# Patient Record
Sex: Female | Born: 1960 | Race: White | Hispanic: No | Smoking: Light tobacco smoker
Health system: Southern US, Community
[De-identification: ages and names within clinical notes are randomized; demographics above are authoritative.]

## PROBLEM LIST (undated history)

## (undated) DIAGNOSIS — Z9989 Dependence on other enabling machines and devices: Secondary | ICD-10-CM

## (undated) DIAGNOSIS — R112 Nausea with vomiting, unspecified: Secondary | ICD-10-CM

## (undated) DIAGNOSIS — Z789 Other specified health status: Secondary | ICD-10-CM

## (undated) DIAGNOSIS — Z9889 Other specified postprocedural states: Secondary | ICD-10-CM

## (undated) DIAGNOSIS — K219 Gastro-esophageal reflux disease without esophagitis: Secondary | ICD-10-CM

## (undated) DIAGNOSIS — N951 Menopausal and female climacteric states: Secondary | ICD-10-CM

## (undated) DIAGNOSIS — E538 Deficiency of other specified B group vitamins: Secondary | ICD-10-CM

## (undated) DIAGNOSIS — K439 Ventral hernia without obstruction or gangrene: Secondary | ICD-10-CM

## (undated) DIAGNOSIS — G43909 Migraine, unspecified, not intractable, without status migrainosus: Secondary | ICD-10-CM

## (undated) DIAGNOSIS — N816 Rectocele: Secondary | ICD-10-CM

## (undated) DIAGNOSIS — K8020 Calculus of gallbladder without cholecystitis without obstruction: Secondary | ICD-10-CM

## (undated) DIAGNOSIS — G4733 Obstructive sleep apnea (adult) (pediatric): Secondary | ICD-10-CM

## (undated) HISTORY — PX: TUBAL LIGATION: SHX77

## (undated) HISTORY — PX: FOOT SURGERY: SHX648

## (undated) HISTORY — PX: FACIAL COSMETIC SURGERY: SHX629

## (undated) HISTORY — PX: NECK EXPLORATION: SHX2077

## (undated) HISTORY — PX: GASTRIC BYPASS: SHX52

---

## 1997-05-10 ENCOUNTER — Ambulatory Visit (HOSPITAL_COMMUNITY): Admission: RE | Admit: 1997-05-10 | Discharge: 1997-05-10 | Payer: Self-pay | Admitting: Obstetrics and Gynecology

## 1999-02-06 ENCOUNTER — Other Ambulatory Visit: Admission: RE | Admit: 1999-02-06 | Discharge: 1999-02-06 | Payer: Self-pay | Admitting: Obstetrics and Gynecology

## 2000-10-18 ENCOUNTER — Other Ambulatory Visit: Admission: RE | Admit: 2000-10-18 | Discharge: 2000-10-18 | Payer: Self-pay | Admitting: Obstetrics and Gynecology

## 2010-08-17 ENCOUNTER — Inpatient Hospital Stay (INDEPENDENT_AMBULATORY_CARE_PROVIDER_SITE_OTHER)
Admission: RE | Admit: 2010-08-17 | Discharge: 2010-08-17 | Disposition: A | Discharge status: Home / Self Care | Payer: 59 | Source: Ambulatory Visit | Attending: Family Medicine | Admitting: Family Medicine

## 2010-08-17 ENCOUNTER — Encounter: Payer: Self-pay | Admitting: Family Medicine

## 2010-08-17 ENCOUNTER — Ambulatory Visit
Admission: RE | Admit: 2010-08-17 | Discharge: 2010-08-17 | Disposition: A | Discharge status: Home / Self Care | Payer: 59 | Source: Ambulatory Visit | Attending: Family Medicine | Admitting: Family Medicine

## 2010-08-17 ENCOUNTER — Other Ambulatory Visit: Payer: Self-pay | Admitting: Family Medicine

## 2010-08-17 DIAGNOSIS — R079 Chest pain, unspecified: Secondary | ICD-10-CM

## 2010-08-17 DIAGNOSIS — J069 Acute upper respiratory infection, unspecified: Secondary | ICD-10-CM

## 2010-08-17 DIAGNOSIS — M940 Chondrocostal junction syndrome [Tietze]: Secondary | ICD-10-CM

## 2010-08-19 ENCOUNTER — Telehealth (INDEPENDENT_AMBULATORY_CARE_PROVIDER_SITE_OTHER): Payer: Self-pay | Admitting: Emergency Medicine

## 2011-01-25 NOTE — Progress Notes (Signed)
Summary: severe pain under right shoulder rm 4   Vital Signs:  Patient Profile:   50 Years Old Female CC:      RT shoulder pain x 2 AM this morning  LMP:     10/23/2009 Height:     62 inches Weight:      225 pounds O2 Sat:      96 % O2 treatment:    Room Air Temp:     99.6 degrees F oral Pulse rate:   82 / minute Resp:     20 per minute BP sitting:   130 / 85  (left arm) Cuff size:   large  Pt. in pain?   yes    Location:   shoulder    Intensity:   8    Type:       sharp  Vitals Entered By: Clemens Catholic LPN (August 17, 2010 8:21 AM)  Menstrual History: LMP (date): 10/23/2009 LMP - Character: no hx of hysterectomy                   Updated Prior Medication List: ESTRADIOL 2 MG TABS (ESTRADIOL)  MEDROXYPROGESTERONE ACETATE 2.5 MG TABS (MEDROXYPROGESTERONE ACETATE)   Current Allergies: No known allergies History of Present Illness Chief Complaint: RT shoulder pain x 2 AM this morning  History of Present Illness:  Subjective: Patient complains of onset of sharp pain underneath her right shoulder blade that awakened her about 2AM today.  The pain now radiates to her right lateral chest, and is worse with cough and deep inspiration.  She has had a cold for about a week with productive cough that had been improving.  She is not a smoker. Initial sore throat resolved No wheezing + nasal congestion, improved + post-nasal drainage No sinus pain/pressure No itchy/red eyes No earache No hemoptysis No SOB No fever/chills No nausea No vomiting No abdominal pain No diarrhea No skin rashes No fatigue No myalgias No headache    REVIEW OF SYSTEMS Constitutional Symptoms      Denies fever, chills, night sweats, weight loss, weight gain, and fatigue.  Eyes       Complains of glasses.      Denies change in vision, eye pain, eye discharge, contact lenses, and eye surgery. Ear/Nose/Throat/Mouth       Denies hearing loss/aids, change in hearing, ear pain, ear  discharge, dizziness, frequent runny nose, frequent nose bleeds, sinus problems, sore throat, hoarseness, and tooth pain or bleeding.  Respiratory       Complains of dry cough.      Denies productive cough, wheezing, shortness of breath, asthma, bronchitis, and emphysema/COPD.  Cardiovascular       Denies murmurs, chest pain, and tires easily with exhertion.    Gastrointestinal       Denies stomach pain, nausea/vomiting, diarrhea, constipation, blood in bowel movements, and indigestion. Genitourniary       Denies painful urination, kidney stones, and loss of urinary control. Neurological       Denies paralysis, seizures, and fainting/blackouts. Musculoskeletal       Denies muscle pain, joint pain, joint stiffness, decreased range of motion, redness, swelling, muscle weakness, and gout.  Skin       Denies bruising, unusual mles/lumps or sores, and hair/skin or nail changes.  Psych       Denies mood changes, temper/anger issues, anxiety/stress, speech problems, depression, and sleep problems. Other Comments: pt c/o severe pain under her RT shoulder blade x 2 AM this morning. She  states that it is now moving around to her RUQ.  she states that she did have diarrhea for 1 day, 2 wks ago. she has also had a cold x 1wk. no OTC meds.   Past History:  Past Medical History: Unremarkable  Past Surgical History: Tubal ligation plantar fascitis   Family History: sister colon CA  Social History: Never Smoked Alcohol use-no Drug use-no Smoking Status:  never Drug Use:  no   Objective:  Appearance:  Patient is obese but otherwise appears healthy, stated age, and in no acute distress  Eyes:  Pupils are equal, round, and reactive to light and accomodation.  Extraocular movement is intact.  Conjunctivae are not inflamed.  Pharynx:  Normal  Neck:  Supple.  No adenopathy is present.   Lungs:  Clear to auscultation.  Breath sounds are equal.  Chest:  Tenderness right chest from base of  scapula to beneath right breast.  Also tender over sternum Heart:  Regular rate and rhythm without murmurs, rubs, or gallops.  Abdomen:  Nontender without masses or hepatosplenomegaly.  Bowel sounds are present.  No CVA or flank tenderness.  Skin:  No rash Extremities:  No edema.  X-ray right ribs and chest:  IMPRESSION: No evidence of right-sided rib fracture, pleural effusion or pneumothorax. Assessment New Problems: UPPER RESPIRATORY INFECTION, ACUTE (ICD-465.9) COSTOCHONDRITIS, ACUTE (ICD-733.6) RIB PAIN, RIGHT SIDED (ICD-786.50)  RESOLVING URI WITH COSTOCHONDRITIS  Plan New Medications/Changes: LORTAB 5 5-500 MG TABS (HYDROCODONE-ACETAMINOPHEN) One or two tabs by mouth hs as needed pain  #12 (twelve) x 0, 08/17/2010, Donna Christen MD  New Orders: T-DG Ribs Unilateral w/Chest*R* [71101] Rib Belt [L0210] Pulse Oximetry (single measurment) [94760] New Patient Level III [16109] Planning Comments:   Rib belt applied.  Begin Ibuprofen for 5 to 7 days (has Rx at home).  Analgesic for bedtime. Return if develops fever or worsening symptoms  Given a [employer]ter patient information and instruction sheet on topic costochondritis   The patient and/or caregiver has been counseled thoroughly with regard to medications prescribed including dosage, schedule, interactions, rationale for use, and possible side effects and they verbalize understanding.  Diagnoses and expected course of recovery discussed and will return if not improved as expected or if the condition worsens. Patient and/or caregiver verbalized understanding.  Prescriptions: LORTAB 5 5-500 MG TABS (HYDROCODONE-ACETAMINOPHEN) One or two tabs by mouth hs as needed pain  #12 (twelve) x 0   Entered and Authorized by:   Donna Christen MD   Signed by:   Donna Christen MD on 08/17/2010   Method used:   Print then Give to Patient   RxID:   [id number]   Orders Added: 1)  T-DG Ribs Unilateral w/Chest*R* [71101] 2)  Rib Belt  [L0210] 3)  Pulse Oximetry (single measurment) [94760] 4)  New Patient Level III [95621]

## 2011-01-25 NOTE — Telephone Encounter (Signed)
  Phone Note Outgoing Call Call back at [facility] Phone [phone]   Call placed by: Emilio Math,  August 19, 2010 2:41 PM Call placed to: Patient Summary of Call: Number disconnected

## 2011-01-25 NOTE — Letter (Signed)
Summary: Out of Work  [facility]  [address]   [place], [place] [address]   Phone: [phone]  Fax: [phone]    August 17, 2010   Employee:  ARNOLD DEPINTO    To Whom It May Concern:   For Medical reasons, please excuse the above named employee from work today and tomorrow.   If you need additional information, please feel free to contact our office.         Sincerely,    Donna Christen MD

## 2011-12-27 DIAGNOSIS — N951 Menopausal and female climacteric states: Secondary | ICD-10-CM | POA: Insufficient documentation

## 2013-07-05 ENCOUNTER — Other Ambulatory Visit (INDEPENDENT_AMBULATORY_CARE_PROVIDER_SITE_OTHER): Payer: Self-pay | Admitting: Otolaryngology

## 2013-07-05 DIAGNOSIS — H918X9 Other specified hearing loss, unspecified ear: Secondary | ICD-10-CM

## 2013-07-05 DIAGNOSIS — R42 Dizziness and giddiness: Secondary | ICD-10-CM

## 2013-07-19 ENCOUNTER — Ambulatory Visit
Admission: RE | Admit: 2013-07-19 | Discharge: 2013-07-19 | Disposition: A | Discharge status: Home / Self Care | Payer: 59 | Source: Ambulatory Visit | Attending: Otolaryngology | Admitting: Otolaryngology

## 2013-07-19 DIAGNOSIS — H918X9 Other specified hearing loss, unspecified ear: Secondary | ICD-10-CM

## 2013-07-19 DIAGNOSIS — R42 Dizziness and giddiness: Secondary | ICD-10-CM

## 2013-07-19 MED ORDER — GADOBENATE DIMEGLUMINE 529 MG/ML IV SOLN
20.0000 mL | Freq: Once | INTRAVENOUS | Status: AC | PRN
  Administered 2013-07-19: 20 mL via INTRAVENOUS

## 2013-09-20 ENCOUNTER — Other Ambulatory Visit: Payer: Self-pay | Admitting: Orthopedic Surgery

## 2013-09-20 ENCOUNTER — Encounter (HOSPITAL_COMMUNITY): Payer: Self-pay | Admitting: Pharmacy Technician

## 2013-09-27 ENCOUNTER — Encounter (HOSPITAL_COMMUNITY)
Admission: RE | Admit: 2013-09-27 | Discharge: 2013-09-27 | Disposition: A | Discharge status: Home / Self Care | Payer: 59 | Source: Ambulatory Visit | Attending: Orthopedic Surgery | Admitting: Orthopedic Surgery

## 2013-09-27 ENCOUNTER — Ambulatory Visit (HOSPITAL_COMMUNITY)
Admission: RE | Admit: 2013-09-27 | Discharge: 2013-09-27 | Disposition: A | Discharge status: Home / Self Care | Payer: 59 | Source: Ambulatory Visit | Attending: Orthopedic Surgery | Admitting: Orthopedic Surgery

## 2013-09-27 ENCOUNTER — Encounter (HOSPITAL_COMMUNITY): Payer: Self-pay

## 2013-09-27 DIAGNOSIS — Z01818 Encounter for other preprocedural examination: Secondary | ICD-10-CM | POA: Diagnosis present

## 2013-09-27 HISTORY — DX: Other specified health status: Z78.9

## 2013-09-27 LAB — CBC WITH DIFFERENTIAL/PLATELET
BASOS ABS: 0.1 10*3/uL (ref 0.0–0.1)
Basophils Relative: 1 % (ref 0–1)
EOS PCT: 2 % (ref 0–5)
Eosinophils Absolute: 0.2 10*3/uL (ref 0.0–0.7)
HCT: 45.7 % (ref 36.0–46.0)
Hemoglobin: 15.2 g/dL — ABNORMAL HIGH (ref 12.0–15.0)
Lymphocytes Relative: 19 % (ref 12–46)
Lymphs Abs: 2 10*3/uL (ref 0.7–4.0)
MCH: 30.2 pg (ref 26.0–34.0)
MCHC: 33.3 g/dL (ref 30.0–36.0)
MCV: 90.9 fL (ref 78.0–100.0)
Monocytes Absolute: 1 10*3/uL (ref 0.1–1.0)
Monocytes Relative: 9 % (ref 3–12)
Neutro Abs: 7.2 10*3/uL (ref 1.7–7.7)
Neutrophils Relative %: 69 % (ref 43–77)
PLATELETS: 313 10*3/uL (ref 150–400)
RBC: 5.03 MIL/uL (ref 3.87–5.11)
RDW: 13.7 % (ref 11.5–15.5)
WBC: 10.4 10*3/uL (ref 4.0–10.5)

## 2013-09-27 LAB — COMPREHENSIVE METABOLIC PANEL
ALT: 14 U/L (ref 0–35)
AST: 15 U/L (ref 0–37)
Albumin: 3.5 g/dL (ref 3.5–5.2)
Alkaline Phosphatase: 54 U/L (ref 39–117)
Anion gap: 13 (ref 5–15)
BUN: 17 mg/dL (ref 6–23)
CALCIUM: 8.9 mg/dL (ref 8.4–10.5)
CO2: 23 mEq/L (ref 19–32)
Chloride: 105 mEq/L (ref 96–112)
Creatinine, Ser: 0.71 mg/dL (ref 0.50–1.10)
GFR calc non Af Amer: >90 mL/min (ref >90)
GLUCOSE: 99 mg/dL (ref 70–99)
Potassium: 4.1 mEq/L (ref 3.7–5.3)
SODIUM: 141 meq/L (ref 137–147)
Total Bilirubin: 0.3 mg/dL (ref 0.3–1.2)
Total Protein: 6.8 g/dL (ref 6.0–8.3)

## 2013-09-27 LAB — ABO/RH: ABO/RH(D): A POS

## 2013-09-27 LAB — URINALYSIS, ROUTINE W REFLEX MICROSCOPIC
BILIRUBIN URINE: NEGATIVE
GLUCOSE, UA: NEGATIVE mg/dL
HGB URINE DIPSTICK: NEGATIVE
KETONES UR: NEGATIVE mg/dL
LEUKOCYTES UA: NEGATIVE
Nitrite: NEGATIVE
PROTEIN: NEGATIVE mg/dL
Specific Gravity, Urine: 1.028 (ref 1.005–1.030)
Urobilinogen, UA: 0.2 mg/dL (ref 0.0–1.0)
pH: 6 (ref 5.0–8.0)

## 2013-09-27 LAB — TYPE AND SCREEN
ABO/RH(D): A POS
Antibody Screen: NEGATIVE

## 2013-09-27 LAB — PROTIME-INR
INR: 0.91 (ref 0.00–1.49)
Prothrombin Time: 12.3 seconds (ref 11.6–15.2)

## 2013-09-27 LAB — SURGICAL PCR SCREEN
MRSA, PCR: NEGATIVE
STAPHYLOCOCCUS AUREUS: NEGATIVE

## 2013-09-27 LAB — APTT: aPTT: 31 seconds (ref 24–37)

## 2013-09-27 MED ORDER — POVIDONE-IODINE 7.5 % EX SOLN: Freq: Once | CUTANEOUS | Status: DC

## 2013-09-27 NOTE — Pre-Procedure Instructions (Signed)
[name]  09/27/2013   Your procedure is scheduled on:  10/04/13  Report to [facility] Admitting at 730 AM.  Call this number if you have problems the morning of surgery: [phone]   Remember:   Do not eat food or drink liquids after midnight.   Take these medicines the morning of surgery with A SIP OF WATER: effexor   Do not wear jewelry, make-up or nail polish.  Do not wear lotions, powders, or perfumes. You may wear deodorant.  Do not shave 48 hours prior to surgery. Men may shave face and neck.  Do not bring valuables to the hospital.  [facility] is not responsible                  for any belongings or valuables.               Contacts, dentures or bridgework may not be worn into surgery.  Leave suitcase in the car. After surgery it may be brought to your room.  For patients admitted to the hospital, discharge time is determined by your                treatment team.               Patients discharged the day of surgery will not be allowed to drive  home.  Name and phone number of your driver: family  Special Instructions: Shower using CHG 2 nights before surgery and the night before surgery.  If you shower the day of surgery use CHG.  Use special wash - you have one bottle of CHG for all showers.  You should use approximately 1/3 of the bottle for each shower.   Please read over the following fact sheets that you were given: Pain Booklet, Coughing and Deep Breathing, Blood Transfusion Information, MRSA Information and Surgical Site Infection Prevention

## 2013-10-03 MED ORDER — CEFAZOLIN SODIUM-DEXTROSE 2-3 GM-% IV SOLR
2.0000 g | INTRAVENOUS | Status: AC
  Administered 2013-10-04: 2 g via INTRAVENOUS
  Filled 2013-10-03: qty 50

## 2013-10-04 ENCOUNTER — Encounter (HOSPITAL_COMMUNITY): Payer: 59 | Admitting: Certified Registered Nurse Anesthetist

## 2013-10-04 ENCOUNTER — Ambulatory Visit (HOSPITAL_COMMUNITY): Payer: 59 | Admitting: Certified Registered Nurse Anesthetist

## 2013-10-04 ENCOUNTER — Observation Stay (HOSPITAL_COMMUNITY)
Admission: RE | Admit: 2013-10-04 | Discharge: 2013-10-05 | Disposition: A | Discharge status: Home / Self Care | Payer: 59 | Source: Ambulatory Visit | Attending: Orthopedic Surgery | Admitting: Orthopedic Surgery

## 2013-10-04 ENCOUNTER — Encounter (HOSPITAL_COMMUNITY)
Admission: RE | Disposition: A | Discharge status: Home / Self Care | Payer: Self-pay | Source: Ambulatory Visit | Attending: Orthopedic Surgery

## 2013-10-04 ENCOUNTER — Encounter (HOSPITAL_COMMUNITY): Payer: Self-pay | Admitting: *Deleted

## 2013-10-04 ENCOUNTER — Ambulatory Visit (HOSPITAL_COMMUNITY): Payer: 59

## 2013-10-04 DIAGNOSIS — Z87891 Personal history of nicotine dependence: Secondary | ICD-10-CM | POA: Insufficient documentation

## 2013-10-04 DIAGNOSIS — M5000 Cervical disc disorder with myelopathy, unspecified cervical region: Principal | ICD-10-CM | POA: Insufficient documentation

## 2013-10-04 DIAGNOSIS — F411 Generalized anxiety disorder: Secondary | ICD-10-CM | POA: Insufficient documentation

## 2013-10-04 DIAGNOSIS — Z981 Arthrodesis status: Secondary | ICD-10-CM | POA: Diagnosis present

## 2013-10-04 HISTORY — PX: ANTERIOR CERVICAL DECOMP/DISCECTOMY FUSION: SHX1161

## 2013-10-04 SURGERY — ANTERIOR CERVICAL DECOMPRESSION/DISCECTOMY FUSION 3 LEVELS
Anesthesia: General | Site: Neck

## 2013-10-04 MED ORDER — LACTATED RINGERS IV SOLN
INTRAVENOUS | Status: DC
  Administered 2013-10-04 (×2): via INTRAVENOUS

## 2013-10-04 MED ORDER — ACETAMINOPHEN 325 MG PO TABS: 325.0000 mg | ORAL_TABLET | ORAL | Status: DC | PRN

## 2013-10-04 MED ORDER — ONDANSETRON HCL 4 MG/2ML IJ SOLN
INTRAMUSCULAR | Status: DC | PRN
  Administered 2013-10-04: 4 mg via INTRAVENOUS

## 2013-10-04 MED ORDER — 0.9 % SODIUM CHLORIDE (POUR BTL) OPTIME
TOPICAL | Status: DC | PRN
  Administered 2013-10-04: 1000 mL

## 2013-10-04 MED ORDER — FLEET ENEMA 7-19 GM/118ML RE ENEM
1.0000 | ENEMA | Freq: Once | RECTAL | Status: AC | PRN
  Filled 2013-10-04: qty 1

## 2013-10-04 MED ORDER — ZOLPIDEM TARTRATE 5 MG PO TABS: 5.0000 mg | ORAL_TABLET | Freq: Every evening | ORAL | Status: DC | PRN

## 2013-10-04 MED ORDER — CEFAZOLIN SODIUM 1-5 GM-% IV SOLN
1.0000 g | Freq: Three times a day (TID) | INTRAVENOUS | Status: AC
  Administered 2013-10-04 – 2013-10-05 (×2): 1 g via INTRAVENOUS
  Filled 2013-10-04 (×2): qty 50

## 2013-10-04 MED ORDER — NEOSTIGMINE METHYLSULFATE 10 MG/10ML IV SOLN
INTRAVENOUS | Status: DC | PRN
  Administered 2013-10-04: 4 mg via INTRAVENOUS

## 2013-10-04 MED ORDER — ACETAMINOPHEN 325 MG PO TABS: 650.0000 mg | ORAL_TABLET | ORAL | Status: DC | PRN

## 2013-10-04 MED ORDER — PROPOFOL 10 MG/ML IV BOLUS
INTRAVENOUS | Status: DC | PRN
  Administered 2013-10-04: 140 mg via INTRAVENOUS

## 2013-10-04 MED ORDER — PHENYLEPHRINE HCL 10 MG/ML IJ SOLN
INTRAMUSCULAR | Status: DC | PRN
  Administered 2013-10-04 (×3): 40 ug via INTRAVENOUS

## 2013-10-04 MED ORDER — VENLAFAXINE HCL ER 150 MG PO CP24
150.0000 mg | ORAL_CAPSULE | Freq: Every day | ORAL | Status: DC
  Administered 2013-10-04: 150 mg via ORAL
  Filled 2013-10-04 (×3): qty 1

## 2013-10-04 MED ORDER — SENNOSIDES-DOCUSATE SODIUM 8.6-50 MG PO TABS
1.0000 | ORAL_TABLET | Freq: Every evening | ORAL | Status: DC | PRN
  Filled 2013-10-04: qty 1

## 2013-10-04 MED ORDER — KETOROLAC TROMETHAMINE 30 MG/ML IJ SOLN: 15.0000 mg | Freq: Once | INTRAMUSCULAR | Status: DC | PRN

## 2013-10-04 MED ORDER — THROMBIN 20000 UNITS EX KIT
PACK | CUTANEOUS | Status: AC
  Filled 2013-10-04: qty 1

## 2013-10-04 MED ORDER — SODIUM CHLORIDE 0.9 % IJ SOLN: 3.0000 mL | INTRAMUSCULAR | Status: DC | PRN

## 2013-10-04 MED ORDER — OXYCODONE HCL 5 MG PO TABS
5.0000 mg | ORAL_TABLET | Freq: Once | ORAL | Status: AC | PRN
  Administered 2013-10-04: 5 mg via ORAL

## 2013-10-04 MED ORDER — ROCURONIUM BROMIDE 100 MG/10ML IV SOLN
INTRAVENOUS | Status: DC | PRN
  Administered 2013-10-04: 50 mg via INTRAVENOUS

## 2013-10-04 MED ORDER — MIDAZOLAM HCL 2 MG/2ML IJ SOLN
INTRAMUSCULAR | Status: AC
  Filled 2013-10-04: qty 2

## 2013-10-04 MED ORDER — MIDAZOLAM HCL 5 MG/5ML IJ SOLN
INTRAMUSCULAR | Status: DC | PRN
  Administered 2013-10-04: 2 mg via INTRAVENOUS

## 2013-10-04 MED ORDER — ONDANSETRON HCL 4 MG/2ML IJ SOLN: 4.0000 mg | INTRAMUSCULAR | Status: DC | PRN

## 2013-10-04 MED ORDER — FENTANYL CITRATE 0.05 MG/ML IJ SOLN
INTRAMUSCULAR | Status: AC
  Filled 2013-10-04: qty 5

## 2013-10-04 MED ORDER — THROMBIN 20000 UNITS EX SOLR
CUTANEOUS | Status: DC | PRN
  Administered 2013-10-04: 13:00:00 via TOPICAL

## 2013-10-04 MED ORDER — PHENOL 1.4 % MT LIQD: 1.0000 | OROMUCOSAL | Status: DC | PRN

## 2013-10-04 MED ORDER — GLYCOPYRROLATE 0.2 MG/ML IJ SOLN
INTRAMUSCULAR | Status: DC | PRN
  Administered 2013-10-04: 0.6 mg via INTRAVENOUS

## 2013-10-04 MED ORDER — MENTHOL 3 MG MT LOZG
1.0000 | LOZENGE | OROMUCOSAL | Status: DC | PRN
  Administered 2013-10-04: 3 mg via ORAL
  Filled 2013-10-04: qty 9

## 2013-10-04 MED ORDER — PROMETHAZINE HCL 25 MG/ML IJ SOLN
INTRAMUSCULAR | Status: AC
  Administered 2013-10-04: 6.25 mg
  Filled 2013-10-04: qty 1

## 2013-10-04 MED ORDER — BISACODYL 5 MG PO TBEC
5.0000 mg | DELAYED_RELEASE_TABLET | Freq: Every day | ORAL | Status: DC | PRN
  Filled 2013-10-04: qty 1

## 2013-10-04 MED ORDER — BUPIVACAINE-EPINEPHRINE (PF) 0.25% -1:200000 IJ SOLN
INTRAMUSCULAR | Status: AC
  Filled 2013-10-04: qty 30

## 2013-10-04 MED ORDER — DOCUSATE SODIUM 100 MG PO CAPS
100.0000 mg | ORAL_CAPSULE | Freq: Two times a day (BID) | ORAL | Status: DC
  Administered 2013-10-04 – 2013-10-05 (×2): 100 mg via ORAL
  Filled 2013-10-04 (×3): qty 1

## 2013-10-04 MED ORDER — OXYCODONE HCL 5 MG/5ML PO SOLN: 5.0000 mg | Freq: Once | ORAL | Status: AC | PRN

## 2013-10-04 MED ORDER — PROPOFOL 10 MG/ML IV BOLUS
INTRAVENOUS | Status: AC
  Filled 2013-10-04: qty 20

## 2013-10-04 MED ORDER — OXYCODONE-ACETAMINOPHEN 5-325 MG PO TABS
1.0000 | ORAL_TABLET | ORAL | Status: DC | PRN
  Administered 2013-10-04 – 2013-10-05 (×4): 2 via ORAL
  Filled 2013-10-04 (×4): qty 2

## 2013-10-04 MED ORDER — LIDOCAINE HCL (CARDIAC) 20 MG/ML IV SOLN
INTRAVENOUS | Status: DC | PRN
Start: 2013-10-04 — End: 2013-10-04
  Administered 2013-10-04: 80 mg via INTRAVENOUS

## 2013-10-04 MED ORDER — SODIUM CHLORIDE 0.9 % IJ SOLN
3.0000 mL | Freq: Two times a day (BID) | INTRAMUSCULAR | Status: DC
  Administered 2013-10-04: 3 mL via INTRAVENOUS

## 2013-10-04 MED ORDER — KETOROLAC TROMETHAMINE 15 MG/ML IJ SOLN
INTRAMUSCULAR | Status: AC
  Administered 2013-10-04: 30 mg
  Filled 2013-10-04: qty 2

## 2013-10-04 MED ORDER — MORPHINE SULFATE 2 MG/ML IJ SOLN
1.0000 mg | INTRAMUSCULAR | Status: DC | PRN
  Administered 2013-10-04: 2 mg via INTRAVENOUS
  Filled 2013-10-04: qty 1

## 2013-10-04 MED ORDER — THROMBIN 20000 UNITS EX SOLR
CUTANEOUS | Status: AC
  Filled 2013-10-04: qty 20000

## 2013-10-04 MED ORDER — ACETAMINOPHEN 650 MG RE SUPP: 650.0000 mg | RECTAL | Status: DC | PRN

## 2013-10-04 MED ORDER — FENTANYL CITRATE 0.05 MG/ML IJ SOLN
INTRAMUSCULAR | Status: DC | PRN
  Administered 2013-10-04: 100 ug via INTRAVENOUS
  Administered 2013-10-04: 150 ug via INTRAVENOUS
  Administered 2013-10-04: 25 ug via INTRAVENOUS
  Administered 2013-10-04 (×2): 50 ug via INTRAVENOUS

## 2013-10-04 MED ORDER — DEXAMETHASONE SODIUM PHOSPHATE 4 MG/ML IJ SOLN
INTRAMUSCULAR | Status: DC | PRN
  Administered 2013-10-04: 4 mg via INTRAVENOUS

## 2013-10-04 MED ORDER — HYDROMORPHONE HCL PF 1 MG/ML IJ SOLN
INTRAMUSCULAR | Status: AC
  Filled 2013-10-04: qty 1

## 2013-10-04 MED ORDER — HYDROMORPHONE HCL PF 1 MG/ML IJ SOLN
0.2500 mg | INTRAMUSCULAR | Status: DC | PRN
  Administered 2013-10-04: 0.5 mg via INTRAVENOUS

## 2013-10-04 MED ORDER — BUPIVACAINE-EPINEPHRINE 0.25% -1:200000 IJ SOLN
INTRAMUSCULAR | Status: DC | PRN
  Administered 2013-10-04: 2 mL

## 2013-10-04 MED ORDER — DIAZEPAM 5 MG PO TABS
5.0000 mg | ORAL_TABLET | Freq: Four times a day (QID) | ORAL | Status: DC | PRN
  Administered 2013-10-04 – 2013-10-05 (×3): 5 mg via ORAL
  Filled 2013-10-04 (×3): qty 1

## 2013-10-04 MED ORDER — OXYCODONE HCL 5 MG PO TABS
ORAL_TABLET | ORAL | Status: AC
  Filled 2013-10-04: qty 1

## 2013-10-04 MED ORDER — ACETAMINOPHEN 160 MG/5ML PO SOLN
325.0000 mg | ORAL | Status: DC | PRN
  Filled 2013-10-04: qty 20.3

## 2013-10-04 MED ORDER — ALUM & MAG HYDROXIDE-SIMETH 200-200-20 MG/5ML PO SUSP: 30.0000 mL | Freq: Four times a day (QID) | ORAL | Status: DC | PRN

## 2013-10-04 MED ORDER — LACTATED RINGERS IV SOLN
INTRAVENOUS | Status: DC | PRN
  Administered 2013-10-04 (×3): via INTRAVENOUS

## 2013-10-04 SURGICAL SUPPLY — 82 items
APL SKNCLS STERI-STRIP NONHPOA (GAUZE/BANDAGES/DRESSINGS) ×1
BENZOIN TINCTURE PRP APPL 2/3 (GAUZE/BANDAGES/DRESSINGS) ×2 IMPLANT
BIT DRILL NEURO 2X3.1 SFT TUCH (MISCELLANEOUS) ×1 IMPLANT
BIT DRILL SKYLINE 12MM (BIT) IMPLANT
BIT DRILL SRG 14X2.2XFLT CHK (BIT) IMPLANT
BIT DRL SRG 14X2.2XFLT CHK (BIT) ×1
BLADE SURG 15 STRL LF DISP TIS (BLADE) ×1 IMPLANT
BLADE SURG 15 STRL SS (BLADE) ×2
BLADE SURG ROTATE 9660 (MISCELLANEOUS) ×2 IMPLANT
BUR MATCHSTICK NEURO 3.0 LAGG (BURR) IMPLANT
CAGE 16X6X14 ENDOSKEL MED (Orthopedic Implant) IMPLANT
CARTRIDGE OIL MAESTRO DRILL (MISCELLANEOUS) ×1 IMPLANT
CLSR STERI-STRIP ANTIMIC 1/2X4 (GAUZE/BANDAGES/DRESSINGS) ×1 IMPLANT
CORDS BIPOLAR (ELECTRODE) ×2 IMPLANT
COVER SURGICAL LIGHT HANDLE (MISCELLANEOUS) ×2 IMPLANT
CRADLE DONUT ADULT HEAD (MISCELLANEOUS) ×2 IMPLANT
DIFFUSER DRILL AIR PNEUMATIC (MISCELLANEOUS) ×2 IMPLANT
DRAIN JACKSON RD 7FR 3/32 (WOUND CARE) IMPLANT
DRAPE C-ARM 42X72 X-RAY (DRAPES) ×2 IMPLANT
DRAPE POUCH INSTRU U-SHP 10X18 (DRAPES) ×2 IMPLANT
DRAPE SURG 17X23 STRL (DRAPES) ×6 IMPLANT
DRILL BIT SKYLINE 12MM (BIT) ×2
DRILL BIT SKYLINE 14MM (BIT) ×2
DRILL NEURO 2X3.1 SOFT TOUCH (MISCELLANEOUS) ×2
DURAPREP 26ML APPLICATOR (WOUND CARE) ×2 IMPLANT
ELECT COATED BLADE 2.86 ST (ELECTRODE) ×2 IMPLANT
ELECT REM PT RETURN 9FT ADLT (ELECTROSURGICAL) ×2
ELECTRODE REM PT RTRN 9FT ADLT (ELECTROSURGICAL) ×1 IMPLANT
ENDOSKELETON IMPLANT MED 6M-0 (Orthopedic Implant) ×2 IMPLANT
EVACUATOR SILICONE 100CC (DRAIN) IMPLANT
GAUZE SPONGE 4X4 12PLY STRL (GAUZE/BANDAGES/DRESSINGS) ×2 IMPLANT
GAUZE SPONGE 4X4 16PLY XRAY LF (GAUZE/BANDAGES/DRESSINGS) ×2 IMPLANT
GLOVE BIO SURGEON STRL SZ7 (GLOVE) ×2 IMPLANT
GLOVE BIO SURGEON STRL SZ8 (GLOVE) ×2 IMPLANT
GLOVE BIOGEL PI IND STRL 7.0 (GLOVE) ×2 IMPLANT
GLOVE BIOGEL PI IND STRL 8 (GLOVE) ×1 IMPLANT
GLOVE BIOGEL PI INDICATOR 7.0 (GLOVE) ×2
GLOVE BIOGEL PI INDICATOR 8 (GLOVE) ×1
GOWN STRL REUS W/ TWL LRG LVL3 (GOWN DISPOSABLE) ×1 IMPLANT
GOWN STRL REUS W/ TWL XL LVL3 (GOWN DISPOSABLE) ×1 IMPLANT
GOWN STRL REUS W/TWL LRG LVL3 (GOWN DISPOSABLE) ×2
GOWN STRL REUS W/TWL XL LVL3 (GOWN DISPOSABLE) ×2
IMPL S ENDOSKEL TC 7 ODEG (Orthopedic Implant) IMPLANT
IMPLANT S ENDOSKEL TC 7 ODEG (Orthopedic Implant) ×4 IMPLANT
IV CATH 14GX2 1/4 (CATHETERS) ×2 IMPLANT
KIT BASIN OR (CUSTOM PROCEDURE TRAY) ×2 IMPLANT
KIT ROOM TURNOVER OR (KITS) ×2 IMPLANT
MANIFOLD NEPTUNE II (INSTRUMENTS) ×2 IMPLANT
NDL SPNL 20GX3.5 QUINCKE YW (NEEDLE) ×1 IMPLANT
NEEDLE 27GAX1X1/2 (NEEDLE) ×2 IMPLANT
NEEDLE SPNL 20GX3.5 QUINCKE YW (NEEDLE) ×2 IMPLANT
NS IRRIG 1000ML POUR BTL (IV SOLUTION) ×2 IMPLANT
OIL CARTRIDGE MAESTRO DRILL (MISCELLANEOUS) ×2
PACK ORTHO CERVICAL (CUSTOM PROCEDURE TRAY) ×2 IMPLANT
PAD ARMBOARD 7.5X6 YLW CONV (MISCELLANEOUS) ×4 IMPLANT
PATTIES SURGICAL .5 X.5 (GAUZE/BANDAGES/DRESSINGS) IMPLANT
PATTIES SURGICAL .5 X1 (DISPOSABLE) IMPLANT
PIN DISTRACTION 14 (PIN) ×2 IMPLANT
PLATE SKYLINE THREE LEVEL 51MM (Plate) ×1 IMPLANT
PUTTY BONE DBX 2.5 MIS (Bone Implant) ×2 IMPLANT
SCREW VAR SELF TAP SKYLINE 14M (Screw) ×4 IMPLANT
SCREW VARIABLE SELF TAP 12MM (Screw) ×4 IMPLANT
SPONGE GAUZE 4X4 12PLY STER LF (GAUZE/BANDAGES/DRESSINGS) ×1 IMPLANT
SPONGE INTESTINAL PEANUT (DISPOSABLE) ×2 IMPLANT
SPONGE SURGIFOAM ABS GEL 100 (HEMOSTASIS) IMPLANT
STRIP CLOSURE SKIN 1/2X4 (GAUZE/BANDAGES/DRESSINGS) ×2 IMPLANT
SURGIFLO TRUKIT (HEMOSTASIS) IMPLANT
SUT BONE WAX W31G (SUTURE) ×1 IMPLANT
SUT MNCRL AB 4-0 PS2 18 (SUTURE) IMPLANT
SUT SILK 4 0 (SUTURE)
SUT SILK 4-0 18XBRD TIE 12 (SUTURE) IMPLANT
SUT VIC AB 2-0 CT2 18 VCP726D (SUTURE) ×2 IMPLANT
SYR BULB IRRIGATION 50ML (SYRINGE) ×2 IMPLANT
SYR CONTROL 10ML LL (SYRINGE) ×2 IMPLANT
TAPE CLOTH 4X10 WHT NS (GAUZE/BANDAGES/DRESSINGS) ×2 IMPLANT
TAPE CLOTH SURG 4X10 WHT LF (GAUZE/BANDAGES/DRESSINGS) ×1 IMPLANT
TAPE UMBILICAL COTTON 1/8X30 (MISCELLANEOUS) ×4 IMPLANT
TOWEL OR 17X24 6PK STRL BLUE (TOWEL DISPOSABLE) ×2 IMPLANT
TOWEL OR 17X26 10 PK STRL BLUE (TOWEL DISPOSABLE) ×2 IMPLANT
TRAY FOLEY CATH 16FRSI W/METER (SET/KITS/TRAYS/PACK) ×2 IMPLANT
WATER STERILE IRR 1000ML POUR (IV SOLUTION) ×2 IMPLANT
YANKAUER SUCT BULB TIP NO VENT (SUCTIONS) ×2 IMPLANT

## 2013-10-04 NOTE — Anesthesia Preprocedure Evaluation (Signed)
Anesthesia Evaluation  Patient identified by MRN, date of birth, ID band Patient awake    Reviewed: Allergy & Precautions, H&P , NPO status , Patient's Chart, lab work & pertinent test results  History of Anesthesia Complications Negative for: history of anesthetic complications  Airway Mallampati: II TM Distance: >3 FB Neck ROM: Full    Dental  (+) Teeth Intact   Pulmonary neg shortness of breath, neg sleep apnea, neg COPDneg recent URI, former smoker,  breath sounds clear to auscultation        Cardiovascular negative cardio ROS  Rhythm:Regular     Neuro/Psych Neck pain with right arm symptoms, no change with neck motion  Neuromuscular disease negative psych ROS   GI/Hepatic negative GI ROS, Neg liver ROS,   Endo/Other  negative endocrine ROS  Renal/GU negative Renal ROS     Musculoskeletal negative musculoskeletal ROS (+)   Abdominal   Peds  Hematology negative hematology ROS (+)   Anesthesia Other Findings   Reproductive/Obstetrics                           Anesthesia Physical Anesthesia Plan  ASA: I  Anesthesia Plan: General   Post-op Pain Management:    Induction: Intravenous  Airway Management Planned: Oral ETT  Additional Equipment: None  Intra-op Plan:   Post-operative Plan: Extubation in OR  Informed Consent: I have reviewed the patients History and Physical, chart, labs and discussed the procedure including the risks, benefits and alternatives for the proposed anesthesia with the patient or authorized representative who has indicated his/her understanding and acceptance.   Dental advisory given  Plan Discussed with: CRNA and Surgeon  Anesthesia Plan Comments:         Anesthesia Quick Evaluation

## 2013-10-04 NOTE — Transfer of Care (Signed)
Immediate Anesthesia Transfer of Care Note  Patient: Sara Wolfe  Procedure(s) Performed: Procedure(s) with comments: ANTERIOR CERVICAL DECOMPRESSION/DISCECTOMY FUSION 3 LEVELS (N/A) - Anterior cervical decompression fusion cervical 4-5, cervical 5-6, cervical 6-7 with instrumentation and allograft  Patient Location: PACU  Anesthesia Type:General  Level of Consciousness: awake, alert , patient cooperative and responds to stimulation  Airway & Oxygen Therapy: Patient Spontanous Breathing and Patient connected to nasal cannula oxygen  Post-op Assessment: Report given to PACU RN, Post -op Vital signs reviewed and stable and Patient moving all extremities X 4  Post vital signs: Reviewed and stable  Complications: No apparent anesthesia complications

## 2013-10-04 NOTE — H&P (Signed)
     PREOPERATIVE H&P  Chief Complaint: neck pain, right arm pain  HPI: Sara Wolfe is a 53 y.o. female who presents with ongoing pain in the neck and right arm  MRI reveals varying degrees of spinal cord compression and stenosis from C4-C7  Patient has failed multiple forms of conservative care and continues to have pain (see office notes for additional details regarding the patient's full course of treatment)  Past Medical History  Diagnosis Date  . Anxiety   . Medical history non-contributory    Past Surgical History  Procedure Laterality Date  . Foot surgery    . Tubal ligation     History   Social History  . Marital Status: Divorced    Spouse Name: N/A    Number of Children: N/A  . Years of Education: N/A   Social History Main Topics  . Smoking status: Former Research scientist (life sciences)  . Smokeless tobacco: Not on file  . Alcohol Use: No  . Drug Use: No  . Sexual Activity: Not on file   Other Topics Concern  . Not on file   Social History Narrative  . No narrative on file   No family history on file. No Known Allergies Prior to Admission medications   Medication Sig Start Date End Date Taking? Authorizing Provider  BIOTIN PO Take 1 capsule by mouth daily.   Yes Historical Provider, MD  ibuprofen (ADVIL,MOTRIN) 200 MG tablet Take 800 mg by mouth every 6 (six) hours as needed for fever, headache or mild pain.   Yes Historical Provider, MD  venlafaxine XR (EFFEXOR-XR) 150 MG 24 hr capsule Take 150 mg by mouth daily with breakfast.   Yes Historical Provider, MD     All other systems have been reviewed and were otherwise negative with the exception of those mentioned in the HPI and as above.  Physical Exam: There were no vitals filed for this visit.  General: Alert, no acute distress Cardiovascular: No pedal edema Respiratory: No cyanosis, no use of accessory musculature Skin: No lesions in the area of chief complaint Neurologic: Sensation intact distally Psychiatric:  Patient is competent for consent with normal mood and affect Lymphatic: No axillary or cervical lymphadenopathy  MUSCULOSKELETAL: + spurling's on right   Assessment/Plan: Radiculopathy and cervical stenosis Plan for Procedure(s): ANTERIOR CERVICAL DECOMPRESSION/DISCECTOMY FUSION 3 LEVELS   Sinclair Ship, MD 10/04/2013 7:09 AM

## 2013-10-04 NOTE — Progress Notes (Signed)
Orthopedic Tech Progress Note Patient Details:  Sara Wolfe 1960/08/12 [id number]  Ortho Devices Type of Ortho Device: [place] cervical collar Ortho Device/Splint Location: at bedside Ortho Device/Splint Interventions: Criss Alvine 10/04/2013, 5:40 PM

## 2013-10-04 NOTE — Plan of Care (Signed)
Problem: Consults Goal: Diagnosis - Spinal Surgery Outcome: Completed/Met Date Met:  10/04/13 Cervical Spine Fusion

## 2013-10-05 ENCOUNTER — Encounter (HOSPITAL_COMMUNITY): Payer: Self-pay | Admitting: Orthopedic Surgery

## 2013-10-05 DIAGNOSIS — M5000 Cervical disc disorder with myelopathy, unspecified cervical region: Secondary | ICD-10-CM | POA: Diagnosis not present

## 2013-10-05 NOTE — Op Note (Signed)
[name], [name] NO.:  [id number]  MEDICAL RECORD NO.:  [id number]  LOCATION:  3C08C                        FACILITY:  [facility]  PHYSICIAN:  Phylliss Bob, MD      DATE OF BIRTH:  05/12/1960  DATE OF PROCEDURE:  10/04/2013                              OPERATIVE REPORT   PREOPERATIVE DIAGNOSIS: 1. Cervical myelopathy. 2. Multilevel cervical degenerative disk disease.  POSTOPERATIVE DIAGNOSIS: 1. Cervical myelopathy. 2. Multilevel cervical degenerative disk disease.  PROCEDURES: 1. Anterior cervical decompression and fusion C4-5, C5-6, C6-7. 2. Placement of anterior instrumentation, C4-C7. 3. Insertion of interbody device x3 (Titan interbody spacers). 4. Use of morselized allograft-DBX mix. 5. Intraoperative use of fluoroscopy.  SURGEON:  Phylliss Bob, MD.  ASSISTANTPricilla Holm, PA-C  ANESTHESIA:  General endotracheal anesthesia.  COMPLICATIONS:  None.  DISPOSITION:  Stable.  ESTIMATED BLOOD LOSS:  Minimal.  INDICATIONS FOR SURGERY:  Briefly, Sara Wolfe is a very pleasant 53 year old female, who did present to me with signs and symptoms associated with cervical myelopathy.  She also did have substantial neck pain. Upon review of her MRI, there was noted to be a disk osteophyte complex at the C4-5 level, causing the flexion of the spinal cord.  There was also a varying degrees of stenosis at the levels below as well as substantial C6-7 degenerative disk disease.  Given the patient's symptoms and lack of improvement with nonoperative measures, we did discuss proceeding with the procedure reflected above.  The patient did elect to proceed.  OPERATIVE DETAILS:  On October 04, 2013, the patient was brought to surgery and general endotracheal anesthesia was administered.  The patient was placed supine on the hospital bed.  All bony prominences were padded.  The neck was gently extended.  The neck was prepped and draped in the usual fashion and  a left transverse incision was made. The platysma was sharply incised.  A Smith-Robinson approach was used and the anterior spine was noted.  A lateral fluoroscopic view did confirm the appropriate operative level.  I then subperiosteally exposed the vertebral bodies of C4, C5, C6, and C7.  Self-retaining retractor was placed centered over the C6-7 intervertebral space.  Caspar pins were placed into the C6 and C7 vertebral bodies and distraction was applied.  I then performed a standard diskectomy using a knife followed by a series of curettes and Kerrison punches.  The posterior longitudinal ligament was encountered and entered using a nerve hook.  I then used #1 followed by #2 Kerrison to perform a central and bilateral neural foraminal decompression.  I was very pleased with the decompression.  The endplates were then prepared in the appropriate size.  The interbody spacer was packed with DBX mix and tamped into position in the usual fashion.  The lower Caspar pin was removed and bone wax was placed in its place.  I then performed a diskectomy in the manner just described at the C5-6 level, and then subsequently at the C4- 5 level.  At each level, the spinal canal was confirmed to be decompressed and at each level, the endplates were prepared and the appropriate size interbody spacer was packed with DBX  mix and tamped into position.  I did use intraoperative fluoroscopy while placing the interbody implants, and was very pleased with the appearance.  The appropriate sized anterior cervical plate was placed over the anterior cervical spine.  Two vertebral body screws were placed in each vertebral bodies from C4-C7 for total of 8 vertebral body screws.  A cam-locking mechanism was then utilized to secure the screws to the plate.  Again, I did use AP and lateral fluoroscopy and was very pleased with the appearance.  I was very pleased with the purchase of each of the screws. The wound was  then copiously irrigated.  The platysma was then closed using 2-0 Vicryl and the skin was closed using 3-0 Monocryl.  All instrument counts were correct at the termination of the procedure.  Of note, Sara Holm, PA-C was my assistant throughout surgery, and did aid in retraction, suctioning, and closure throughout the surgery.     Phylliss Bob, MD     MD/MEDQ  D:  10/04/2013  T:  10/05/2013  Job:  [id number]

## 2013-10-05 NOTE — Progress Notes (Signed)
Patient alert and oriented, mae's well, voiding adequate amount of urine, swallowing without difficulty, c/o moderate pain and meds given prior to discharged. Patient discharged home with family. Script and discharged instructions given to patient. Patient and family stated understanding of instructions given.  

## 2013-10-05 NOTE — Anesthesia Postprocedure Evaluation (Signed)
  Anesthesia Post-op Note  Patient: Sara Wolfe  Procedure(s) Performed: Procedure(s) with comments: ANTERIOR CERVICAL DECOMPRESSION/DISCECTOMY FUSION 3 LEVELS (N/A) - Anterior cervical decompression fusion cervical 4-5, cervical 5-6, cervical 6-7 with instrumentation and allograft  Patient Location: PACU  Anesthesia Type:General  Level of Consciousness: awake and alert   Airway and Oxygen Therapy: Patient Spontanous Breathing  Post-op Pain: mild  Post-op Assessment: Post-op Vital signs reviewed, Patient's Cardiovascular Status Stable, Respiratory Function Stable, Patent Airway, No signs of Nausea or vomiting and Pain level controlled  Post-op Vital Signs: Reviewed and stable  Last Vitals:  Filed Vitals:   10/05/13 0828  BP: 135/86  Pulse: 73  Temp: 37.3 C  Resp: 20    Complications: No apparent anesthesia complications

## 2013-10-05 NOTE — Progress Notes (Signed)
UR Completed Canden Cieslinski Graves-Bigelow, RN,BSN [phone]  

## 2013-10-05 NOTE — Progress Notes (Signed)
    Patient doing well, reports different kind of neck pain, no symptoms in arms or hands, expected PO neck soreness and tightness, mild throat irritation, has been eating and drinking OK.   Physical Exam: BP 127/79  Pulse 88  Temp(Src) 98.7 F (37.1 C) (Oral)  Resp 20  Ht 5\' 1"  (1.549 m)  Wt 97.977 kg (216 lb)  BMI 40.83 kg/m2  SpO2 93%  Hard collar worn appropriately. 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

## 2013-10-10 NOTE — Discharge Summary (Signed)
Patient ID: [name] MRN: [id number] DOB/AGE: 09-02-1960 53 y.o.  Admit date: 10/04/2013 Discharge date: 10/05/2013  Admission Diagnoses:  Active Problems:   Myeloradiculopathy   Discharge Diagnoses:  Same  Past Medical History  Diagnosis Date  . Anxiety   . Medical history non-contributory     Surgeries: Procedure(s): ANTERIOR CERVICAL DECOMPRESSION/DISCECTOMY FUSION 3 LEVELS C4-7 on 10/04/2013   Consultants:  None  Discharged Condition: Improved  Hospital Course: Sara Wolfe is an 53 y.o. female who was admitted 10/04/2013 for operative treatment of myeloradiculoapthy. Patient has severe unremitting pain that affects sleep, daily activities, and work/hobbies. After pre-op clearance the patient was taken to the operating room on 10/04/2013 and underwent  Procedure(s): ANTERIOR CERVICAL DECOMPRESSION/DISCECTOMY FUSION 3 LEVELS C4-7.    Patient was given perioperative antibiotics:  Anti-infectives   Start     Dose/Rate Route Frequency Ordered Stop   10/04/13 1800  ceFAZolin (ANCEF) IVPB 1 g/50 mL premix     1 g 100 mL/hr over 30 Minutes Intravenous Every 8 hours 10/04/13 1714 10/05/13 0133   10/04/13 0600  ceFAZolin (ANCEF) IVPB 2 g/50 mL premix     2 g 100 mL/hr over 30 Minutes Intravenous On call to O.R. 10/03/13 1442 10/04/13 1200       Patient was given sequential compression devices, early ambulation to prevent DVT.  Patient benefited maximally from hospital stay and there were no complications.    Recent vital signs: BP 132/92  Pulse 87  Temp(Src) 98.9 F (37.2 C) (Oral)  Resp 20  Ht 5\' 1"  (1.549 m)  Wt 97.977 kg (216 lb)  BMI 40.83 kg/m2  SpO2 92%  Discharge Medications:     Medication List    STOP taking these medications       ibuprofen 200 MG tablet  Commonly known as:  ADVIL,MOTRIN      TAKE these medications       BIOTIN PO  Take 1 capsule by mouth daily.     venlafaxine XR 150 MG 24 hr capsule  Commonly known as:  EFFEXOR-XR   Take 150 mg by mouth daily with breakfast.        Diagnostic Studies: Dg Chest 2 View  09/27/2013   CLINICAL DATA:  Preoperative evaluation for cervical spine surgery  EXAM: CHEST  2 VIEW  COMPARISON:  None.  FINDINGS: The heart size and mediastinal contours are within normal limits. Both lungs are clear. The visualized skeletal structures are unremarkable.  IMPRESSION: No active cardiopulmonary disease.   Electronically Signed   By: Sara Wolfe M.D.   On: 09/27/2013 15:52   Dg Cervical Spine 2-3 Views  10/04/2013   CLINICAL DATA:  Anterior fixation.  EXAM: DG C-ARM 61-120 MIN; CERVICAL SPINE - 2-3 VIEW  COMPARISON:  MRI of 08/24/2013  FINDINGS: Two intraoperative images. These demonstrate anterior plate and screw fixation at C4-5 and C5-6. Inferior aspect of the fixation plate is not well visualized, presumed fixation at C6-7. No acute hardware complication.  IMPRESSION: Intraoperative imaging of anterior spine fixation.   Electronically Signed   By: Abigail Miyamoto M.D.   On: 10/04/2013 15:52   Dg C-arm 1-60 Min  10/04/2013   CLINICAL DATA:  Anterior fixation.  EXAM: DG C-ARM 61-120 MIN; CERVICAL SPINE - 2-3 VIEW  COMPARISON:  MRI of 08/24/2013  FINDINGS: Two intraoperative images. These demonstrate anterior plate and screw fixation at C4-5 and C5-6. Inferior aspect of the fixation plate is not well visualized, presumed fixation at C6-7.  No acute hardware complication.  IMPRESSION: Intraoperative imaging of anterior spine fixation.   Electronically Signed   By: Abigail Miyamoto M.D.   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

## 2014-01-18 ENCOUNTER — Encounter (HOSPITAL_BASED_OUTPATIENT_CLINIC_OR_DEPARTMENT_OTHER): Payer: Self-pay

## 2014-01-18 ENCOUNTER — Emergency Department (HOSPITAL_BASED_OUTPATIENT_CLINIC_OR_DEPARTMENT_OTHER): Payer: 59

## 2014-01-18 ENCOUNTER — Emergency Department (HOSPITAL_BASED_OUTPATIENT_CLINIC_OR_DEPARTMENT_OTHER)
Admission: EM | Admit: 2014-01-18 | Discharge: 2014-01-18 | Disposition: A | Discharge status: Home / Self Care | Payer: 59 | Attending: Emergency Medicine | Admitting: Emergency Medicine

## 2014-01-18 DIAGNOSIS — R52 Pain, unspecified: Secondary | ICD-10-CM

## 2014-01-18 DIAGNOSIS — Z981 Arthrodesis status: Secondary | ICD-10-CM | POA: Insufficient documentation

## 2014-01-18 DIAGNOSIS — S4991XA Unspecified injury of right shoulder and upper arm, initial encounter: Secondary | ICD-10-CM | POA: Insufficient documentation

## 2014-01-18 DIAGNOSIS — S199XXA Unspecified injury of neck, initial encounter: Secondary | ICD-10-CM | POA: Insufficient documentation

## 2014-01-18 DIAGNOSIS — Y9289 Other specified places as the place of occurrence of the external cause: Secondary | ICD-10-CM | POA: Insufficient documentation

## 2014-01-18 DIAGNOSIS — S4992XA Unspecified injury of left shoulder and upper arm, initial encounter: Secondary | ICD-10-CM | POA: Insufficient documentation

## 2014-01-18 DIAGNOSIS — W010XXA Fall on same level from slipping, tripping and stumbling without subsequent striking against object, initial encounter: Secondary | ICD-10-CM | POA: Diagnosis not present

## 2014-01-18 DIAGNOSIS — Z87891 Personal history of nicotine dependence: Secondary | ICD-10-CM | POA: Insufficient documentation

## 2014-01-18 DIAGNOSIS — Z79899 Other long term (current) drug therapy: Secondary | ICD-10-CM | POA: Diagnosis not present

## 2014-01-18 DIAGNOSIS — Y9301 Activity, walking, marching and hiking: Secondary | ICD-10-CM | POA: Diagnosis not present

## 2014-01-18 DIAGNOSIS — Y998 Other external cause status: Secondary | ICD-10-CM | POA: Diagnosis not present

## 2014-01-18 DIAGNOSIS — M25511 Pain in right shoulder: Secondary | ICD-10-CM

## 2014-01-18 MED ORDER — OXYCODONE-ACETAMINOPHEN 5-325 MG PO TABS
2.0000 | ORAL_TABLET | Freq: Once | ORAL | Status: AC
  Administered 2014-01-18: 2 via ORAL
  Filled 2014-01-18: qty 2

## 2014-01-18 NOTE — ED Provider Notes (Signed)
CSN: [id number]     Arrival date & time 01/18/14  1532 History  This chart was scribed for Shaune Pollack, MD by Cathie Hoops, ED Scribe. The patient was seen in . The patient's care was started at 6:52 PM.     Chief Complaint  Patient presents with  . Shoulder Pain   Patient is a 53 y.o. female presenting with shoulder pain. The history is provided by the patient. No language interpreter was used.  Shoulder Pain Location:  Shoulder Time since incident:  5 days Injury: no   Shoulder location:  L shoulder Pain details:    Quality:  Unable to specify   Radiates to:  L arm (neck)   Severity:  Moderate   Onset quality:  Sudden   Duration:  5 days   Timing:  Constant   Progression:  Worsening Chronicity:  New Dislocation: no   Foreign body present:  No foreign bodies Relieved by:  Nothing Worsened by:  Movement Ineffective treatments:  Acetaminophen Associated symptoms: neck pain, numbness and tingling   Associated symptoms: no fever    HPI Comments: Sara Wolfe is a 53 y.o. Female with past medical history of c-spine surgery who presents to the Emergency Department complaining of new, moderate and gradually worsening left shoulder pain onset 5 days ago. She notes her pain radiates to her neck and left arm. Pt has associated pain in the left arm, numbness and tingling in the neck. Pt states her pain is worsened with ROM and moving the neck to the left and right. Pt has attempted to take tramadol with minimal relief to alleviate her pain.   Pt had neck surgery at [facility] on August 12. Pt states she can take ibuprofen but takes extra strength tylenol. She notes she does have tramadol at home from her surgery. Pt previously had neck surgery on 10/03/2013 and states she fell 5 days ago while walking. She states when she fell she landed on her right knee and palms. She denies any immediate left shoulder pain after her fall. Pt notes she did have some pain under the right axilla  at that time. She is unable to specify if she had LOC at that time. Pt denies any other symptoms at this time.  Past Medical History  Diagnosis Date  . Medical history non-contributory    Past Surgical History  Procedure Laterality Date  . Foot surgery    . Tubal ligation    . Anterior cervical decomp/discectomy fusion N/A 10/04/2013    Procedure: ANTERIOR CERVICAL DECOMPRESSION/DISCECTOMY FUSION 3 LEVELS;  Surgeon: Sinclair Ship, MD;  Location: [facility];  Service: Orthopedics;  Laterality: N/A;  Anterior cervical decompression fusion cervical 4-5, cervical 5-6, cervical 6-7 with instrumentation and allograft   No family history on file. History  Substance Use Topics  . Smoking status: Former Research scientist (life sciences)  . Smokeless tobacco: Not on file  . Alcohol Use: No   OB History    No data available     Review of Systems  Constitutional: Negative for fever and chills.  Gastrointestinal: Negative for nausea and vomiting.  Musculoskeletal: Positive for arthralgias and neck pain.  Neurological: Positive for numbness. Negative for weakness.  All other systems reviewed and are negative.  Allergies  Review of patient's allergies indicates no known allergies.  Home Medications   Prior to Admission medications   Medication Sig Start Date End Date Taking? Authorizing Provider  acetaminophen (TYLENOL) 500 MG tablet Take 500 mg by mouth every  6 (six) hours as needed.   Yes Historical Provider, MD  Diazepam (VALIUM PO) Take by mouth.   Yes Historical Provider, MD  Methocarbamol (ROBAXIN PO) Take by mouth.   Yes Historical Provider, MD  TRAMADOL HCL PO Take by mouth.   Yes Historical Provider, MD  BIOTIN PO Take 1 capsule by mouth daily.    Historical Provider, MD  venlafaxine XR (EFFEXOR-XR) 150 MG 24 hr capsule Take 150 mg by mouth daily with breakfast.    Historical Provider, MD   Triage Vitals: BP 173/99 mmHg  Pulse 100  Temp(Src) 98.9 F (37.2 C) (Oral)  Resp 18  Ht 5\' 1"  (1.549 m)   Wt 225 lb (102.059 kg)  BMI 42.54 kg/m2  SpO2 100%  Physical Exam  Constitutional: She is oriented to person, place, and time. She appears well-developed and well-nourished.  HENT:  Head: Normocephalic and atraumatic.  Right Ear: External ear normal.  Left Ear: External ear normal.  Nose: Nose normal.  Mouth/Throat: Oropharynx is clear and moist.  Eyes: Conjunctivae and EOM are normal. Pupils are equal, round, and reactive to light.  Neck: Normal range of motion. Neck supple.  Cardiovascular: Normal rate, regular rhythm, normal heart sounds and intact distal pulses.   Pulmonary/Chest: Effort normal and breath sounds normal.  Abdominal: Soft. Bowel sounds are normal.  Musculoskeletal: Normal range of motion.  Radial pulses equal bilaterally. Good bilaterally strength. Extensor elbow strength. Tenderness in the left anterior shoulder. Equal, externally rotation. Full active ROM of the left shoulder but pain with motion.  Neurological: She is alert and oriented to person, place, and time. She has normal reflexes.  Skin: Skin is warm and dry.  Psychiatric: She has a normal mood and affect. Her behavior is normal. Judgment and thought content normal.  Nursing note and vitals reviewed.   ED Course  Procedures (including critical care time) DIAGNOSTIC STUDIES: Oxygen Saturation is 100% on RA, normal by my interpretation.    COORDINATION OF CARE: 6:56 PM- Patient informed of current plan for treatment and evaluation and agrees with plan at this time.   Imaging Review Dg Cervical Spine Complete  01/18/2014   CLINICAL DATA:  Status post fall 5 days ago ; awakened this morning with left left shoulder and scapular pain now radiating into the fingers ; history of previous anterior cervical fusion  EXAM: CERVICAL SPINE  4+ VIEWS  COMPARISON:  MRI of the cervical spine and lateral plain films of the cervical spine of July and August 2015  FINDINGS: The cervical vertebral bodies are preserved  in height. The patient has undergone anterior cervical fusion from C4 through C7. The metallic hardware is intact. The intradiscal devices are in appropriate position radiographically. The oblique views reveal no bony encroachment upon the neural foramina. There is no perched facet. The spinous processes and odontoid are intact. The prevertebral soft tissue spaces are normal.  IMPRESSION: The anterior fusion hardware from C4 through C7 is intact. No acute abnormalities of these vertebral levels are demonstrated. The other vertebral levels are intact as well. There is no bony neural foraminal encroachment.   Electronically Signed   By: David  Martinique   On: 01/18/2014 16:11     MDM   Final diagnoses:  Shoulder pain, acute, right    I personally performed the services described in this documentation, which was scribed in my presence. The recorded information has been reviewed and considered.   Shaune Pollack, MD 01/20/14 [phone]

## 2014-01-18 NOTE — ED Notes (Signed)
Reviewed case with EDP Ray-orders for cspine

## 2014-01-18 NOTE — ED Notes (Addendum)
C/o pain/numb/tingling to left shoulder and arm-neck surgery 8/12-pain is worse when she turns her head-pt states she sis fall 5 days ago but denies pain to left side after fall

## 2014-01-18 NOTE — Discharge Instructions (Signed)
Shoulder Pain  The shoulder is the joint that connects your arm to your body. Muscles and band-like tissues that connect bones to muscles (tendons) hold the joint together. Shoulder pain is felt if an injury or medical problem affects one or more parts of the shoulder.  HOME CARE   · Put ice on the sore area.  ¨ Put ice in a plastic bag.  ¨ Place a towel between your skin and the bag.  ¨ Leave the ice on for 15-20 minutes, 03-04 times a day for the first 2 days.  · Stop using cold packs if they do not help with the pain.  · If you were given something to keep your shoulder from moving (sling; shoulder immobilizer), wear it as told. Only take it off to shower or bathe.  · Move your arm as little as possible, but keep your hand moving to prevent puffiness (swelling).  · Squeeze a soft ball or foam pad as much as possible to help prevent swelling.  · Take medicine as told by your doctor.  GET HELP IF:  · You have progressing new pain in your arm, hand, or fingers.  · Your hand or fingers get cold.  · Your medicine does not help lessen your pain.  GET HELP RIGHT AWAY IF:   · Your arm, hand, or fingers are numb or tingling.  · Your arm, hand, or fingers are puffy (swollen), painful, or turn white or blue.  MAKE SURE YOU:   · Understand these instructions.  · Will watch your condition.  · Will get help right away if you are not doing well or get worse.  Document Released: 07/28/2007 Document Revised: 06/25/2013 Document Reviewed: 08/23/2011  ExitCare® Patient Information ©2015 [employer]. This information is not intended to replace advice given to you by your health care provider. Make sure you discuss any questions you have with your health care provider.

## 2014-01-24 ENCOUNTER — Ambulatory Visit: Payer: 59 | Admitting: Sports Medicine

## 2014-02-21 ENCOUNTER — Ambulatory Visit (INDEPENDENT_AMBULATORY_CARE_PROVIDER_SITE_OTHER): Payer: 59 | Admitting: Sports Medicine

## 2014-02-21 ENCOUNTER — Encounter: Payer: Self-pay | Admitting: Sports Medicine

## 2014-02-21 VITALS — BP 124/78 | HR 84 | Ht 61.0 in | Wt 236.0 lb

## 2014-02-21 DIAGNOSIS — Z903 Acquired absence of stomach [part of]: Secondary | ICD-10-CM | POA: Insufficient documentation

## 2014-02-21 DIAGNOSIS — E669 Obesity, unspecified: Secondary | ICD-10-CM

## 2014-02-21 DIAGNOSIS — Z981 Arthrodesis status: Secondary | ICD-10-CM

## 2014-02-21 DIAGNOSIS — N951 Menopausal and female climacteric states: Secondary | ICD-10-CM

## 2014-02-21 DIAGNOSIS — Z0000 Encounter for general adult medical examination without abnormal findings: Secondary | ICD-10-CM | POA: Insufficient documentation

## 2014-02-21 MED ORDER — PHENTERMINE HCL 37.5 MG PO CAPS: ORAL_CAPSULE | ORAL | Status: DC

## 2014-02-21 MED ORDER — GABAPENTIN 600 MG PO TABS
ORAL_TABLET | ORAL | Status: DC
Start: 2014-02-21 — End: 2014-08-15

## 2014-02-21 NOTE — Patient Instructions (Signed)
Black Cohosh, Cimicifuga racemosa oral dosage forms What is this medicine? BLACK COHOSH (blak KOH hosh) or Cimicifuga racemosa is a dietary supplement. It is being promoted to help support female health problems, like the symptoms of menopause (hot flashes). Black cohosh is also promoted to ease menstrual pain or pre-menstrual syndrome (PMS). The FDA does not recognize black cohosh as being safe or effective for any use at this time, and warns against its use in pregnancy. This supplement may be used for other purposes; ask your health care provider or pharmacist if you have questions. This medicine may be used for other purposes; ask your health care provider or pharmacist if you have questions. What should I tell my health care provider before I take this medicine? They need to know if you have any of these conditions: -cancer -endometriosis or uterine fibroids -high blood pressure -infertility -kidney disease -liver disease -menstrual changes or irregular periods -unusual vaginal or uterine bleeding -an unusual or allergic reaction to black cohosh, soybeans, tartrazine dye (yellow dye number 5), other medicines, foods, dyes, or preservatives -pregnant or trying to get pregnant -breast-feeding How should I use this medicine? Take this herb by mouth with a glass of water. Follow the directions on the package labeling, or talk to your health care professional. Do not use for longer than 6 months without the advice of a health care professional. Do not use if you are pregnant or breast-feeding. Talk to your obstetrician-gynecologist or certified nurse-midwife. This herb is not for use in children under the age of 68 years. Overdosage: If you think you have taken too much of this medicine contact a poison control center or emergency room at once. NOTE: This medicine is only for you. Do not share this medicine with others. What if I miss a dose? If you miss a dose, take it as soon as you can. If  it is almost time for your next dose, take only that dose. Do not take double or extra doses. What may interact with this medicine? -female hormones, like estrogens or progestins and birth control pills -fertility treatments -medicines for blood pressure -medicines for diabetes This list may not describe all possible interactions. Give your health care provider a list of all the medicines, herbs, non-prescription drugs, or dietary supplements you use. Also tell them if you smoke, drink alcohol, or use illegal drugs. Some items may interact with your medicine. What should I watch for while using this medicine? Since this herb is derived from a plant, allergic reactions are possible. Stop using this herb if you develop a rash. You may need to see your health care professional, or inform them that this occurred. Report any unusual side effects promptly. If you are taking this herb for menstrual or menopausal symptoms, visit your doctor or health care professional for regular checks on your progress. You should have a complete check-up every 6 months. You will need a regular breast and pelvic exam while on this therapy. Follow the advice of your doctor or health care professional. If you have any reason to think you are pregnant, stop taking this herb at once and contact your doctor or health care professional. Herbal or dietary supplements are not regulated like medicines. Rigid quality control standards are not required for dietary supplements. The purity and strength of these products can vary. The safety and effect of this dietary supplement for a certain disease or illness is not well known. This product is not intended to diagnose, treat, cure or  prevent any disease. The Food and Drug Administration suggests the following to help consumers protect themselves: -Always read product labels and follow directions. -Natural does not mean a product is safe for humans to take. -Look for products that  include USP after the ingredient name. This means that the manufacturer followed the standards of the [place] Pharmacopoeia. -Supplements made or sold by a nationally known food or drug company are more likely to be made under tight controls. You can write to the company for more information about how the product was made. What side effects may I notice from receiving this medicine? Side effects that you should report to your doctor or health care professional as soon as possible: -allergic reactions like skin rash, itching or hives, swelling of the face, lips, or tongue -difficulty breathing, shortness of breath, or wheezing -easy bruising -fast heartbeat, slow heartbeat, or palpitations -headache -high blood pressure -severe nausea or vomiting -unusually weak or tired Side effects that usually do not require medical attention (report to your doctor or health care professional if they continue or are bothersome): -heartburn -mild upset stomach This list may not describe all possible side effects. Call your doctor for medical advice about side effects. You may report side effects to FDA at 1-800-FDA-1088. Where should I keep my medicine? Keep out of the reach of children. Store at room temperature between 15 and 30 degrees C (59 and 86 degrees C). Throw away any unused herb after the expiration date. NOTE: This sheet is a summary. It may not cover all possible information. If you have questions about this medicine, talk to your doctor, pharmacist, or health care provider.  2015, Elsevier/Gold Standard. (2007-05-11 13:29:09)

## 2014-02-21 NOTE — Assessment & Plan Note (Signed)
Status post C3-C6 fusion. Currently followed by Dr. 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

## 2014-02-21 NOTE — Assessment & Plan Note (Signed)
Up-to-date on screening measures.  Colonoscopy 3 years ago. Mammogram last year. Does have a family history of early colon cancer in a first-degree relative, she does desire a new gastroenterologist. She does have a gynecologist who manages her cervical cancer screening and mammograms.

## 2014-02-21 NOTE — Progress Notes (Signed)
  Subjective:    CC: Establish care.   HPI:  Cervical degenerative disc disease: Post C3-C6 fusion by Dr. Lynann Bologna. Management per [facility]. She is having new one set left-sided C8 radiculopathy.  Menopausal: Currently on gabapentin, Effexor, did try hormone replacement in the past and had endometrial hyperplasia.  Obesity: Would like to try some weight loss medication. Has a voracious appetite and has tried other forms of weight loss without success.  Preventative measures: Family history of early colon cancer, last colonoscopy was 3 years ago. Mammogram was last year.  Past medical history, Surgical history, Family history not pertinant except as noted below, Social history, Allergies, and medications have been entered into the medical record, reviewed, and no changes needed.   Review of Systems: No headache, visual changes, nausea, vomiting, diarrhea, constipation, dizziness, abdominal pain, skin rash, fevers, chills, night sweats, swollen lymph nodes, weight loss, chest pain, body aches, joint swelling, muscle aches, shortness of breath, mood changes, visual or auditory hallucinations.  Objective:    General: Well Developed, well nourished, and in no acute distress.  Neuro: Alert and oriented x3, extra-ocular muscles intact, sensation grossly intact.  HEENT: Normocephalic, atraumatic, pupils equal round reactive to light, neck supple, no masses, no lymphadenopathy, thyroid nonpalpable.  Skin: Warm and dry, no rashes noted.  Cardiac: Regular rate and rhythm, no murmurs rubs or gallops.  Respiratory: Clear to auscultation bilaterally. Not using accessory muscles, speaking in full sentences.  Abdominal: Soft, nontender, nondistended, positive bowel sounds, no masses, no organomegaly.  Musculoskeletal: Shoulder, elbow, wrist, hip, knee, ankle stable, and with full range of motion.  Impression and Recommendations:    The patient was counselled, risk factors were discussed,  anticipatory guidance given.

## 2014-02-21 NOTE — Assessment & Plan Note (Signed)
Phentermine, nutrition referral, return monthly for weight checks and refills.

## 2014-02-21 NOTE — Assessment & Plan Note (Signed)
Currently on Effexor 150. Gabapentin 300 which we will increase to 600. Adding Black cohosh. She has tried hormone replacement therapy in the past which resulted in endometrial hyperplasia.

## 2014-03-20 ENCOUNTER — Other Ambulatory Visit: Payer: Self-pay | Admitting: Orthopedic Surgery

## 2014-03-22 ENCOUNTER — Ambulatory Visit: Payer: 59 | Admitting: Sports Medicine

## 2014-03-28 ENCOUNTER — Encounter (HOSPITAL_COMMUNITY): Payer: Self-pay | Admitting: Pharmacy Technician

## 2014-04-02 ENCOUNTER — Encounter (HOSPITAL_COMMUNITY)
Admission: RE | Admit: 2014-04-02 | Discharge: 2014-04-02 | Disposition: A | Discharge status: Home / Self Care | Payer: 59 | Source: Ambulatory Visit | Attending: Orthopedic Surgery | Admitting: Orthopedic Surgery

## 2014-04-02 ENCOUNTER — Encounter (HOSPITAL_COMMUNITY): Payer: Self-pay

## 2014-04-02 DIAGNOSIS — M5023 Other cervical disc displacement, cervicothoracic region: Secondary | ICD-10-CM | POA: Diagnosis not present

## 2014-04-02 DIAGNOSIS — K219 Gastro-esophageal reflux disease without esophagitis: Secondary | ICD-10-CM | POA: Diagnosis not present

## 2014-04-02 DIAGNOSIS — Z87891 Personal history of nicotine dependence: Secondary | ICD-10-CM | POA: Diagnosis not present

## 2014-04-02 HISTORY — DX: Menopausal and female climacteric states: N95.1

## 2014-04-02 HISTORY — DX: Other specified postprocedural states: Z98.890

## 2014-04-02 HISTORY — DX: Gastro-esophageal reflux disease without esophagitis: K21.9

## 2014-04-02 HISTORY — DX: Nausea with vomiting, unspecified: R11.2

## 2014-04-02 LAB — COMPREHENSIVE METABOLIC PANEL
ALT: 20 U/L (ref 0–35)
ANION GAP: 9 (ref 5–15)
AST: 21 U/L (ref 0–37)
Albumin: 3.9 g/dL (ref 3.5–5.2)
Alkaline Phosphatase: 52 U/L (ref 39–117)
BILIRUBIN TOTAL: 0.6 mg/dL (ref 0.3–1.2)
BUN: 11 mg/dL (ref 6–23)
CO2: 24 mmol/L (ref 19–32)
Calcium: 9.2 mg/dL (ref 8.4–10.5)
Chloride: 107 mmol/L (ref 96–112)
Creatinine, Ser: 0.85 mg/dL (ref 0.50–1.10)
GFR calc non Af Amer: 76 mL/min — ABNORMAL LOW (ref >90)
GFR, EST AFRICAN AMERICAN: 88 mL/min — AB (ref >90)
GLUCOSE: 94 mg/dL (ref 70–99)
Potassium: 4.3 mmol/L (ref 3.5–5.1)
Sodium: 140 mmol/L (ref 135–145)
TOTAL PROTEIN: 6.9 g/dL (ref 6.0–8.3)

## 2014-04-02 LAB — CBC WITH DIFFERENTIAL/PLATELET
BASOS ABS: 0.1 10*3/uL (ref 0.0–0.1)
Basophils Relative: 1 % (ref 0–1)
EOS ABS: 0.2 10*3/uL (ref 0.0–0.7)
EOS PCT: 2 % (ref 0–5)
HCT: 44.9 % (ref 36.0–46.0)
HEMOGLOBIN: 15.2 g/dL — AB (ref 12.0–15.0)
LYMPHS PCT: 26 % (ref 12–46)
Lymphs Abs: 2.3 10*3/uL (ref 0.7–4.0)
MCH: 30.5 pg (ref 26.0–34.0)
MCHC: 33.9 g/dL (ref 30.0–36.0)
MCV: 90.2 fL (ref 78.0–100.0)
MONO ABS: 0.8 10*3/uL (ref 0.1–1.0)
Monocytes Relative: 9 % (ref 3–12)
Neutro Abs: 5.6 10*3/uL (ref 1.7–7.7)
Neutrophils Relative %: 62 % (ref 43–77)
Platelets: 321 10*3/uL (ref 150–400)
RBC: 4.98 MIL/uL (ref 3.87–5.11)
RDW: 13.3 % (ref 11.5–15.5)
WBC: 9 10*3/uL (ref 4.0–10.5)

## 2014-04-02 LAB — PROTIME-INR
INR: 0.97 (ref 0.00–1.49)
Prothrombin Time: 13 seconds (ref 11.6–15.2)

## 2014-04-02 LAB — URINALYSIS, ROUTINE W REFLEX MICROSCOPIC
Glucose, UA: NEGATIVE mg/dL
Hgb urine dipstick: NEGATIVE
Ketones, ur: 15 mg/dL — AB
Leukocytes, UA: NEGATIVE
NITRITE: NEGATIVE
PROTEIN: NEGATIVE mg/dL
Specific Gravity, Urine: 1.027 (ref 1.005–1.030)
UROBILINOGEN UA: 0.2 mg/dL (ref 0.0–1.0)
pH: 6.5 (ref 5.0–8.0)

## 2014-04-02 LAB — TYPE AND SCREEN
ABO/RH(D): A POS
Antibody Screen: NEGATIVE

## 2014-04-02 LAB — APTT: aPTT: 31 seconds (ref 24–37)

## 2014-04-02 LAB — SURGICAL PCR SCREEN
MRSA, PCR: NEGATIVE
STAPHYLOCOCCUS AUREUS: POSITIVE — AB

## 2014-04-02 NOTE — Pre-Procedure Instructions (Signed)
Sara Wolfe  04/02/2014   Your procedure is scheduled on:  Thursday February 18,2016  Report to [facility] Admitting at 0530 AM.  Call this number if you have problems the morning of surgery: [phone]   Remember:   Do not eat food or drink liquids after midnight.   Take these medicines the morning of surgery with A SIP OF WATER: Neurontin, Hydrocodone-acetaminophen or Tramadol if needed for pain, Lyrica, and Effexor-XR.    Do not wear jewelry, make-up or nail polish.  Do not wear lotions, powders, or perfumes. You may wear deodorant.  Do not shave 48 hours prior to surgery.   Do not bring valuables to the hospital.  [facility] is not responsible                  for any belongings or valuables.               Contacts, dentures or bridgework may not be worn into surgery.  Leave suitcase in the car. After surgery it may be brought to your room.  For patients admitted to the hospital, discharge time is determined by your                treatment team.               Patients discharged the day of surgery will not be allowed to drive  home.    Special Instructions: [facility] - Preparing for Surgery  Before surgery, you can play an important role.  Because skin is not sterile, your skin needs to be as free of germs as possible.  You can reduce the number of germs on you skin by washing with CHG (chlorahexidine gluconate) soap before surgery.  CHG is an antiseptic cleaner which kills germs and bonds with the skin to continue killing germs even after washing.  Please DO NOT use if you have an allergy to CHG or antibacterial soaps.  If your skin becomes reddened/irritated stop using the CHG and inform your nurse when you arrive at Short Stay.  Do not shave (including legs and underarms) for at least 48 hours prior to the first CHG shower.  You may shave your face.  Please follow these instructions carefully:   1.  Shower with CHG Soap the night before surgery and the                                 morning of Surgery.  2.  If you choose to wash your hair, wash your hair first as usual with your       normal shampoo.  3.  After you shampoo, rinse your hair and body thoroughly to remove the                      Shampoo.  4.  Use CHG as you would any other liquid soap.  You can apply chg directly       to the skin and wash gently with scrungie or a clean washcloth.  5.  Apply the CHG Soap to your body ONLY FROM THE NECK DOWN.        Do not use on open wounds or open sores.  Avoid contact with your eyes,       ears, mouth and genitals (private parts).  Wash genitals (private parts)       with your normal  soap.  6.  Wash thoroughly, paying special attention to the area where your surgery        will be performed.  7.  Thoroughly rinse your body with warm water from the neck down.  8.  DO NOT shower/wash with your normal soap after using and rinsing off       the CHG Soap.  9.  Pat yourself dry with a clean towel.            10.  Wear clean pajamas.            11.  Place clean sheets on your bed the night of your first shower and do not        sleep with pets.  Day of Surgery  Do not apply any lotions/deoderants the morning of surgery.  Please wear clean clothes to the hospital/surgery center.      Please read over the following fact sheets that you were given: Pain Booklet, Coughing and Deep Breathing, Blood Transfusion Information, MRSA Information and Surgical Site Infection Prevention

## 2014-04-02 NOTE — Progress Notes (Signed)
Patient made aware that nasal swab was positive for staph and that a script was called to her pharmacy [phone]. She verbalized understanding.

## 2014-04-10 MED ORDER — CEFAZOLIN SODIUM-DEXTROSE 2-3 GM-% IV SOLR
2.0000 g | INTRAVENOUS | Status: AC
  Administered 2014-04-11: 2 g via INTRAVENOUS
  Filled 2014-04-10: qty 50

## 2014-04-10 NOTE — H&P (Signed)
     PREOPERATIVE H&P  Chief Complaint: left arm pain  HPI: Sara Wolfe is a 54 y.o. 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 Medical history non-contributory   . PONV (postoperative nausea and vomiting)   . GERD (gastroesophageal reflux disease)   . Hot flashes, menopausal    Past Surgical History  Procedure Laterality Date  . Foot surgery    . Tubal ligation    . Anterior cervical decomp/discectomy fusion N/A 10/04/2013    Procedure: ANTERIOR CERVICAL DECOMPRESSION/DISCECTOMY FUSION 3 LEVELS;  Surgeon: Sinclair Ship, MD;  Location: [facility];  Service: Orthopedics;  Laterality: N/A;  Anterior cervical decompression fusion cervical 4-5, cervical 5-6, cervical 6-7 with instrumentation and allograft   History   Social History  . Marital Status: Single    Spouse Name: N/A  . Number of Children: N/A  . Years of Education: N/A   Social History Main Topics  . Smoking status: Former Smoker -- 0.25 packs/day for 2 years    Types: Cigarettes    Quit date: 04/02/2012  . Smokeless tobacco: Never Used  . Alcohol Use: No  . Drug Use: No  . Sexual Activity: Not on file   Other Topics Concern  . Not on file   Social History Narrative   No family history on file. No Known Allergies Prior to Admission medications   Medication Sig Start Date End Date Taking? Authorizing Provider  gabapentin (NEURONTIN) 600 MG tablet 1-2 tabs PO TID Patient taking differently: Take 1,200 mg by mouth 3 (three) times daily.  02/21/14  Yes Silverio Decamp, MD  HYDROcodone-acetaminophen (NORCO) 5-325 MG per tablet Take 2 tablets by mouth every 4 (four) hours as needed for moderate pain.    Yes Historical Provider, MD    pregabalin (LYRICA) 50 MG capsule Take 50 mg by mouth 2 (two) times daily.   Yes Historical Provider, MD  traMADol (ULTRAM) 50 MG tablet Take 100 mg by mouth 2 (two) times daily.   Yes Historical Provider, MD  venlafaxine XR (EFFEXOR-XR) 150 MG 24 hr capsule Take 150 mg by mouth daily with breakfast.   Yes Historical Provider, MD  cyclobenzaprine (FLEXERIL) 5 MG tablet Take 5 mg by mouth 3 (three) times daily as needed for muscle spasms.    Historical Provider, MD  phentermine 37.5 MG capsule One capsule by mouth at 11 AM Patient not taking: Reported on 03/28/2014 02/21/14   Silverio Decamp, MD     All other systems have been reviewed and were otherwise negative with the exception of those mentioned in the HPI and as above.  Physical Exam: There were no vitals filed for this visit.  General: Alert, no acute distress Cardiovascular: No pedal edema Respiratory: No cyanosis, no use of accessory musculature Skin: No lesions in the area of chief complaint Neurologic: Sensation intact distally Psychiatric: Patient is competent for consent with normal mood and affect Lymphatic: No axillary or cervical lymphadenopathy  MUSCULOSKELETAL: + spurling's on left  Assessment/Plan: Left arm pain Plan for Procedure(s): ANTERIOR CERVICAL DECOMPRESSION/DISCECTOMY FUSION 1 LEVEL   Sinclair Ship, MD 04/10/2014 4:51 PM

## 2014-04-10 NOTE — Anesthesia Preprocedure Evaluation (Addendum)
Anesthesia Evaluation  Patient identified by MRN, date of birth, ID band Patient awake    Reviewed: Allergy & Precautions, H&P , NPO status , Patient's Chart, lab work & pertinent test results, reviewed documented beta blocker date and time   History of Anesthesia Complications (+) PONV  Airway Mallampati: II   Neck ROM: Full    Dental  (+) Teeth Intact, Dental Advidsory Given   Pulmonary former smoker,  breath sounds clear to auscultation        Cardiovascular Rhythm:Regular     Neuro/Psych negative neurological ROS  negative psych ROS   GI/Hepatic GERD-  Medicated,  Endo/Other  negative endocrine ROS  Renal/GU negative Renal ROSCreat .8     Musculoskeletal   Abdominal (+) + obese,   Peds  Hematology 15/45   Anesthesia Other Findings PONV  Reproductive/Obstetrics                           Anesthesia Physical Anesthesia Plan  ASA: III  Anesthesia Plan: General   Post-op Pain Management:    Induction: Intravenous  Airway Management Planned: Oral ETT  Additional Equipment:   Intra-op Plan:   Post-operative Plan: Extubation in OR  Informed Consent: I have reviewed the patients History and Physical, chart, labs and discussed the procedure including the risks, benefits and alternatives for the proposed anesthesia with the patient or authorized representative who has indicated his/her understanding and acceptance.   Dental Advisory Given  Plan Discussed with: Anesthesiologist, CRNA and Surgeon  Anesthesia Plan Comments: (Multimodal pain rx)       Anesthesia Quick Evaluation

## 2014-04-11 ENCOUNTER — Ambulatory Visit (HOSPITAL_COMMUNITY): Payer: 59

## 2014-04-11 ENCOUNTER — Ambulatory Visit (HOSPITAL_COMMUNITY): Payer: 59 | Admitting: Anesthesiology

## 2014-04-11 ENCOUNTER — Encounter (HOSPITAL_COMMUNITY)
Admission: RE | Disposition: A | Discharge status: Home / Self Care | Payer: Self-pay | Source: Ambulatory Visit | Attending: Orthopedic Surgery

## 2014-04-11 ENCOUNTER — Encounter (HOSPITAL_COMMUNITY): Payer: Self-pay | Admitting: Anesthesiology

## 2014-04-11 ENCOUNTER — Observation Stay (HOSPITAL_COMMUNITY)
Admission: RE | Admit: 2014-04-11 | Discharge: 2014-04-12 | Disposition: A | Discharge status: Home / Self Care | Payer: 59 | Source: Ambulatory Visit | Attending: Orthopedic Surgery | Admitting: Orthopedic Surgery

## 2014-04-11 DIAGNOSIS — M51369 Other intervertebral disc degeneration, lumbar region without mention of lumbar back pain or lower extremity pain: Secondary | ICD-10-CM | POA: Diagnosis present

## 2014-04-11 DIAGNOSIS — Z419 Encounter for procedure for purposes other than remedying health state, unspecified: Secondary | ICD-10-CM

## 2014-04-11 DIAGNOSIS — M5023 Other cervical disc displacement, cervicothoracic region: Principal | ICD-10-CM | POA: Insufficient documentation

## 2014-04-11 DIAGNOSIS — M5136 Other intervertebral disc degeneration, lumbar region: Secondary | ICD-10-CM | POA: Diagnosis present

## 2014-04-11 DIAGNOSIS — K219 Gastro-esophageal reflux disease without esophagitis: Secondary | ICD-10-CM | POA: Insufficient documentation

## 2014-04-11 DIAGNOSIS — Z87891 Personal history of nicotine dependence: Secondary | ICD-10-CM | POA: Insufficient documentation

## 2014-04-11 HISTORY — PX: ANTERIOR CERVICAL DECOMP/DISCECTOMY FUSION: SHX1161

## 2014-04-11 SURGERY — ANTERIOR CERVICAL DECOMPRESSION/DISCECTOMY FUSION 1 LEVEL
Anesthesia: General

## 2014-04-11 MED ORDER — FLEET ENEMA 7-19 GM/118ML RE ENEM
1.0000 | ENEMA | Freq: Once | RECTAL | Status: AC | PRN
  Filled 2014-04-11: qty 1

## 2014-04-11 MED ORDER — LIDOCAINE HCL (CARDIAC) 20 MG/ML IV SOLN
INTRAVENOUS | Status: AC
  Filled 2014-04-11: qty 5

## 2014-04-11 MED ORDER — ARTIFICIAL TEARS OP OINT
TOPICAL_OINTMENT | OPHTHALMIC | Status: DC | PRN
  Administered 2014-04-11: 1 via OPHTHALMIC

## 2014-04-11 MED ORDER — SODIUM CHLORIDE 0.9 % IJ SOLN
INTRAMUSCULAR | Status: AC
  Filled 2014-04-11: qty 10

## 2014-04-11 MED ORDER — ROCURONIUM BROMIDE 50 MG/5ML IV SOLN
INTRAVENOUS | Status: AC
  Filled 2014-04-11: qty 1

## 2014-04-11 MED ORDER — ROCURONIUM BROMIDE 100 MG/10ML IV SOLN
INTRAVENOUS | Status: DC | PRN
  Administered 2014-04-11: 50 mg via INTRAVENOUS

## 2014-04-11 MED ORDER — VECURONIUM BROMIDE 10 MG IV SOLR
INTRAVENOUS | Status: AC
  Filled 2014-04-11: qty 10

## 2014-04-11 MED ORDER — SCOPOLAMINE 1 MG/3DAYS TD PT72
MEDICATED_PATCH | TRANSDERMAL | Status: DC | PRN
  Administered 2014-04-11: 1 via TRANSDERMAL

## 2014-04-11 MED ORDER — SCOPOLAMINE 1 MG/3DAYS TD PT72
MEDICATED_PATCH | TRANSDERMAL | Status: AC
  Filled 2014-04-11: qty 1

## 2014-04-11 MED ORDER — MIDAZOLAM HCL 2 MG/2ML IJ SOLN
INTRAMUSCULAR | Status: AC
  Filled 2014-04-11: qty 2

## 2014-04-11 MED ORDER — FENTANYL CITRATE 0.05 MG/ML IJ SOLN
INTRAMUSCULAR | Status: DC | PRN
  Administered 2014-04-11: 100 ug via INTRAVENOUS
  Administered 2014-04-11 (×5): 50 ug via INTRAVENOUS

## 2014-04-11 MED ORDER — DOCUSATE SODIUM 100 MG PO CAPS
100.0000 mg | ORAL_CAPSULE | Freq: Two times a day (BID) | ORAL | Status: DC
  Administered 2014-04-11 (×2): 100 mg via ORAL
  Filled 2014-04-11: qty 1

## 2014-04-11 MED ORDER — ARTIFICIAL TEARS OP OINT
TOPICAL_OINTMENT | OPHTHALMIC | Status: AC
  Filled 2014-04-11: qty 3.5

## 2014-04-11 MED ORDER — BUPIVACAINE-EPINEPHRINE 0.25% -1:200000 IJ SOLN
INTRAMUSCULAR | Status: DC | PRN
  Administered 2014-04-11: 3 mL

## 2014-04-11 MED ORDER — SENNOSIDES-DOCUSATE SODIUM 8.6-50 MG PO TABS
1.0000 | ORAL_TABLET | Freq: Every evening | ORAL | Status: DC | PRN
  Filled 2014-04-11: qty 1

## 2014-04-11 MED ORDER — BUPIVACAINE-EPINEPHRINE (PF) 0.25% -1:200000 IJ SOLN
INTRAMUSCULAR | Status: AC
  Filled 2014-04-11: qty 30

## 2014-04-11 MED ORDER — FENTANYL CITRATE 0.05 MG/ML IJ SOLN
INTRAMUSCULAR | Status: AC
  Filled 2014-04-11: qty 5

## 2014-04-11 MED ORDER — NEOSTIGMINE METHYLSULFATE 10 MG/10ML IV SOLN
INTRAVENOUS | Status: DC | PRN
  Administered 2014-04-11: 4 mg via INTRAVENOUS

## 2014-04-11 MED ORDER — FENTANYL CITRATE 0.05 MG/ML IJ SOLN
INTRAMUSCULAR | Status: AC
  Administered 2014-04-11: 50 ug via INTRAVENOUS
  Filled 2014-04-11: qty 2

## 2014-04-11 MED ORDER — VENLAFAXINE HCL ER 150 MG PO CP24
150.0000 mg | ORAL_CAPSULE | Freq: Every day | ORAL | Status: DC
  Administered 2014-04-11: 150 mg via ORAL
  Filled 2014-04-11 (×2): qty 1

## 2014-04-11 MED ORDER — GLYCOPYRROLATE 0.2 MG/ML IJ SOLN
INTRAMUSCULAR | Status: AC
  Filled 2014-04-11: qty 4

## 2014-04-11 MED ORDER — VECURONIUM BROMIDE 10 MG IV SOLR
INTRAVENOUS | Status: DC | PRN
  Administered 2014-04-11 (×2): 2 mg via INTRAVENOUS

## 2014-04-11 MED ORDER — DIAZEPAM 5 MG PO TABS
5.0000 mg | ORAL_TABLET | Freq: Four times a day (QID) | ORAL | Status: DC | PRN
  Administered 2014-04-11 – 2014-04-12 (×4): 5 mg via ORAL
  Filled 2014-04-11 (×3): qty 1

## 2014-04-11 MED ORDER — MIDAZOLAM HCL 5 MG/5ML IJ SOLN
INTRAMUSCULAR | Status: DC | PRN
  Administered 2014-04-11: 2 mg via INTRAVENOUS

## 2014-04-11 MED ORDER — ACETAMINOPHEN 10 MG/ML IV SOLN
INTRAVENOUS | Status: AC
  Administered 2014-04-11: 1000 mg via INTRAVENOUS
  Filled 2014-04-11: qty 100

## 2014-04-11 MED ORDER — CEFAZOLIN SODIUM 1-5 GM-% IV SOLN
1.0000 g | Freq: Three times a day (TID) | INTRAVENOUS | Status: AC
  Administered 2014-04-11 (×2): 1 g via INTRAVENOUS
  Filled 2014-04-11 (×2): qty 50

## 2014-04-11 MED ORDER — FENTANYL CITRATE 0.05 MG/ML IJ SOLN
INTRAMUSCULAR | Status: AC
  Filled 2014-04-11: qty 2

## 2014-04-11 MED ORDER — FENTANYL CITRATE 0.05 MG/ML IJ SOLN
25.0000 ug | INTRAMUSCULAR | Status: DC | PRN
  Administered 2014-04-11 (×3): 50 ug via INTRAVENOUS

## 2014-04-11 MED ORDER — PHENYLEPHRINE HCL 10 MG/ML IJ SOLN
INTRAMUSCULAR | Status: DC | PRN
  Administered 2014-04-11 (×2): 80 ug via INTRAVENOUS

## 2014-04-11 MED ORDER — ONDANSETRON HCL 4 MG/2ML IJ SOLN
4.0000 mg | INTRAMUSCULAR | Status: DC | PRN
Start: 2014-04-11 — End: 2014-04-12

## 2014-04-11 MED ORDER — ACETAMINOPHEN 325 MG PO TABS
650.0000 mg | ORAL_TABLET | ORAL | Status: DC | PRN
Start: 2014-04-11 — End: 2014-04-12

## 2014-04-11 MED ORDER — THROMBIN 20000 UNITS EX SOLR
CUTANEOUS | Status: AC
  Filled 2014-04-11: qty 40000

## 2014-04-11 MED ORDER — PROPOFOL 10 MG/ML IV BOLUS
INTRAVENOUS | Status: DC | PRN
  Administered 2014-04-11: 180 mg via INTRAVENOUS

## 2014-04-11 MED ORDER — MORPHINE SULFATE 2 MG/ML IJ SOLN
1.0000 mg | INTRAMUSCULAR | Status: DC | PRN
  Administered 2014-04-11: 2 mg via INTRAVENOUS
  Filled 2014-04-11: qty 1

## 2014-04-11 MED ORDER — PREGABALIN 50 MG PO CAPS
50.0000 mg | ORAL_CAPSULE | Freq: Two times a day (BID) | ORAL | Status: DC
  Administered 2014-04-11 (×2): 50 mg via ORAL
  Filled 2014-04-11 (×2): qty 1

## 2014-04-11 MED ORDER — EPHEDRINE SULFATE 50 MG/ML IJ SOLN
INTRAMUSCULAR | Status: AC
  Filled 2014-04-11: qty 1

## 2014-04-11 MED ORDER — LIDOCAINE HCL (CARDIAC) 20 MG/ML IV SOLN
INTRAVENOUS | Status: DC | PRN
  Administered 2014-04-11: 100 mg via INTRAVENOUS

## 2014-04-11 MED ORDER — ZOLPIDEM TARTRATE 5 MG PO TABS
5.0000 mg | ORAL_TABLET | Freq: Every evening | ORAL | Status: DC | PRN
Start: 2014-04-11 — End: 2014-04-12

## 2014-04-11 MED ORDER — PHENOL 1.4 % MT LIQD
1.0000 | OROMUCOSAL | Status: DC | PRN
  Filled 2014-04-11: qty 177

## 2014-04-11 MED ORDER — EPHEDRINE SULFATE 50 MG/ML IJ SOLN
INTRAMUSCULAR | Status: DC | PRN
  Administered 2014-04-11 (×2): 10 mg via INTRAVENOUS

## 2014-04-11 MED ORDER — GLYCOPYRROLATE 0.2 MG/ML IJ SOLN
INTRAMUSCULAR | Status: DC | PRN
  Administered 2014-04-11: 0.6 mg via INTRAVENOUS

## 2014-04-11 MED ORDER — ACETAMINOPHEN 650 MG RE SUPP: 650.0000 mg | RECTAL | Status: DC | PRN

## 2014-04-11 MED ORDER — STERILE WATER FOR INJECTION IJ SOLN
INTRAMUSCULAR | Status: AC
  Filled 2014-04-11: qty 10

## 2014-04-11 MED ORDER — ONDANSETRON HCL 4 MG/2ML IJ SOLN
INTRAMUSCULAR | Status: DC | PRN
  Administered 2014-04-11: 4 mg via INTRAVENOUS

## 2014-04-11 MED ORDER — DEXAMETHASONE SODIUM PHOSPHATE 4 MG/ML IJ SOLN
INTRAMUSCULAR | Status: AC
  Filled 2014-04-11: qty 1

## 2014-04-11 MED ORDER — SUCCINYLCHOLINE CHLORIDE 20 MG/ML IJ SOLN
INTRAMUSCULAR | Status: AC
  Filled 2014-04-11: qty 1

## 2014-04-11 MED ORDER — PROPOFOL 10 MG/ML IV BOLUS
INTRAVENOUS | Status: AC
  Filled 2014-04-11: qty 20

## 2014-04-11 MED ORDER — ONDANSETRON HCL 4 MG/2ML IJ SOLN
INTRAMUSCULAR | Status: AC
  Filled 2014-04-11: qty 2

## 2014-04-11 MED ORDER — OXYCODONE-ACETAMINOPHEN 5-325 MG PO TABS
1.0000 | ORAL_TABLET | ORAL | Status: DC | PRN
  Administered 2014-04-11 – 2014-04-12 (×4): 2 via ORAL
  Filled 2014-04-11 (×4): qty 2

## 2014-04-11 MED ORDER — DIPHENHYDRAMINE HCL 50 MG/ML IJ SOLN
INTRAMUSCULAR | Status: DC | PRN
  Administered 2014-04-11: 12.5 mg via INTRAVENOUS

## 2014-04-11 MED ORDER — MENTHOL 3 MG MT LOZG
1.0000 | LOZENGE | OROMUCOSAL | Status: DC | PRN
  Filled 2014-04-11: qty 9

## 2014-04-11 MED ORDER — SODIUM CHLORIDE 0.9 % IJ SOLN: 3.0000 mL | INTRAMUSCULAR | Status: DC | PRN

## 2014-04-11 MED ORDER — DIPHENHYDRAMINE HCL 50 MG/ML IJ SOLN
INTRAMUSCULAR | Status: AC
  Filled 2014-04-11: qty 1

## 2014-04-11 MED ORDER — NEOSTIGMINE METHYLSULFATE 10 MG/10ML IV SOLN
INTRAVENOUS | Status: AC
  Filled 2014-04-11: qty 1

## 2014-04-11 MED ORDER — THROMBIN 20000 UNITS EX KIT
PACK | CUTANEOUS | Status: DC | PRN
  Administered 2014-04-11: 20 mL via TOPICAL

## 2014-04-11 MED ORDER — PROPOFOL INFUSION 10 MG/ML OPTIME
INTRAVENOUS | Status: DC | PRN
  Administered 2014-04-11: 20 ug/kg/min via INTRAVENOUS

## 2014-04-11 MED ORDER — SODIUM CHLORIDE 0.9 % IJ SOLN
3.0000 mL | Freq: Two times a day (BID) | INTRAMUSCULAR | Status: DC
  Administered 2014-04-11: 3 mL via INTRAVENOUS

## 2014-04-11 MED ORDER — MEPERIDINE HCL 25 MG/ML IJ SOLN: 6.2500 mg | INTRAMUSCULAR | Status: DC | PRN

## 2014-04-11 MED ORDER — GABAPENTIN 600 MG PO TABS
1200.0000 mg | ORAL_TABLET | Freq: Three times a day (TID) | ORAL | Status: DC
  Administered 2014-04-11 (×2): 1200 mg via ORAL
  Filled 2014-04-11 (×5): qty 2

## 2014-04-11 MED ORDER — PROMETHAZINE HCL 25 MG/ML IJ SOLN: 6.2500 mg | INTRAMUSCULAR | Status: DC | PRN

## 2014-04-11 MED ORDER — BISACODYL 5 MG PO TBEC
5.0000 mg | DELAYED_RELEASE_TABLET | Freq: Every day | ORAL | Status: DC | PRN
  Filled 2014-04-11: qty 1

## 2014-04-11 MED ORDER — DIAZEPAM 5 MG PO TABS
ORAL_TABLET | ORAL | Status: AC
  Administered 2014-04-11: 5 mg via ORAL
  Filled 2014-04-11: qty 1

## 2014-04-11 MED ORDER — 0.9 % SODIUM CHLORIDE (POUR BTL) OPTIME
TOPICAL | Status: DC | PRN
  Administered 2014-04-11: 1000 mL

## 2014-04-11 MED ORDER — SODIUM CHLORIDE 0.9 % IV SOLN: 250.0000 mL | INTRAVENOUS | Status: DC

## 2014-04-11 MED ORDER — ALUM & MAG HYDROXIDE-SIMETH 200-200-20 MG/5ML PO SUSP: 30.0000 mL | Freq: Four times a day (QID) | ORAL | Status: DC | PRN

## 2014-04-11 MED ORDER — LACTATED RINGERS IV SOLN
INTRAVENOUS | Status: DC | PRN
  Administered 2014-04-11 (×2): via INTRAVENOUS

## 2014-04-11 SURGICAL SUPPLY — 74 items
APL SKNCLS STERI-STRIP NONHPOA (GAUZE/BANDAGES/DRESSINGS) ×1
BENZOIN TINCTURE PRP APPL 2/3 (GAUZE/BANDAGES/DRESSINGS) ×2 IMPLANT
BIT DRILL NEURO 2X3.1 SFT TUCH (MISCELLANEOUS) ×1 IMPLANT
BLADE SURG 15 STRL LF DISP TIS (BLADE) ×1 IMPLANT
BLADE SURG 15 STRL SS (BLADE) ×2
BLADE SURG ROTATE 9660 (MISCELLANEOUS) ×1 IMPLANT
BUR MATCHSTICK NEURO 3.0 LAGG (BURR) ×1 IMPLANT
CARTRIDGE OIL MAESTRO DRILL (MISCELLANEOUS) ×1 IMPLANT
CLSR STERI-STRIP ANTIMIC 1/2X4 (GAUZE/BANDAGES/DRESSINGS) ×1 IMPLANT
CORDS BIPOLAR (ELECTRODE) ×1 IMPLANT
COVER SURGICAL LIGHT HANDLE (MISCELLANEOUS) ×2 IMPLANT
CRADLE DONUT ADULT HEAD (MISCELLANEOUS) ×2 IMPLANT
DIFFUSER DRILL AIR PNEUMATIC (MISCELLANEOUS) ×2 IMPLANT
DRAIN JACKSON RD 7FR 3/32 (WOUND CARE) IMPLANT
DRAPE C-ARM 42X72 X-RAY (DRAPES) ×2 IMPLANT
DRAPE POUCH INSTRU U-SHP 10X18 (DRAPES) ×2 IMPLANT
DRAPE SURG 17X23 STRL (DRAPES) ×8 IMPLANT
DRILL NEURO 2X3.1 SOFT TOUCH (MISCELLANEOUS) ×2
DURAPREP 26ML APPLICATOR (WOUND CARE) ×2 IMPLANT
ELECT COATED BLADE 2.86 ST (ELECTRODE) ×2 IMPLANT
ELECT REM PT RETURN 9FT ADLT (ELECTROSURGICAL) ×2
ELECTRODE REM PT RTRN 9FT ADLT (ELECTROSURGICAL) ×1 IMPLANT
EVACUATOR SILICONE 100CC (DRAIN) IMPLANT
GAUZE SPONGE 4X4 12PLY STRL (GAUZE/BANDAGES/DRESSINGS) ×2 IMPLANT
GAUZE SPONGE 4X4 16PLY XRAY LF (GAUZE/BANDAGES/DRESSINGS) ×2 IMPLANT
GLOVE BIO SURGEON STRL SZ 6.5 (GLOVE) ×1 IMPLANT
GLOVE BIO SURGEON STRL SZ7 (GLOVE) ×3 IMPLANT
GLOVE BIO SURGEON STRL SZ8 (GLOVE) ×3 IMPLANT
GLOVE BIOGEL PI IND STRL 6.5 (GLOVE) IMPLANT
GLOVE BIOGEL PI IND STRL 7.0 (GLOVE) ×2 IMPLANT
GLOVE BIOGEL PI IND STRL 8 (GLOVE) ×1 IMPLANT
GLOVE BIOGEL PI INDICATOR 6.5 (GLOVE) ×1
GLOVE BIOGEL PI INDICATOR 7.0 (GLOVE) ×2
GLOVE BIOGEL PI INDICATOR 8 (GLOVE) ×2
GLOVE ECLIPSE 6.5 STRL STRAW (GLOVE) ×1 IMPLANT
GOWN STRL REUS W/ TWL LRG LVL3 (GOWN DISPOSABLE) ×1 IMPLANT
GOWN STRL REUS W/ TWL XL LVL3 (GOWN DISPOSABLE) ×1 IMPLANT
GOWN STRL REUS W/TWL LRG LVL3 (GOWN DISPOSABLE) ×6
GOWN STRL REUS W/TWL XL LVL3 (GOWN DISPOSABLE) ×2
IV CATH 14GX2 1/4 (CATHETERS) ×2 IMPLANT
KIT BASIN OR (CUSTOM PROCEDURE TRAY) ×2 IMPLANT
KIT ROOM TURNOVER OR (KITS) ×2 IMPLANT
MANIFOLD NEPTUNE II (INSTRUMENTS) ×2 IMPLANT
NDL SPNL 20GX3.5 QUINCKE YW (NEEDLE) ×1 IMPLANT
NEEDLE 27GAX1X1/2 (NEEDLE) ×2 IMPLANT
NEEDLE SPNL 20GX3.5 QUINCKE YW (NEEDLE) ×2 IMPLANT
NS IRRIG 1000ML POUR BTL (IV SOLUTION) ×2 IMPLANT
OIL CARTRIDGE MAESTRO DRILL (MISCELLANEOUS) ×2
PACK ORTHO CERVICAL (CUSTOM PROCEDURE TRAY) ×2 IMPLANT
PAD ARMBOARD 7.5X6 YLW CONV (MISCELLANEOUS) ×4 IMPLANT
PATTIES SURGICAL .5 X.5 (GAUZE/BANDAGES/DRESSINGS) IMPLANT
PATTIES SURGICAL .5 X1 (DISPOSABLE) ×1 IMPLANT
PEEK IMPLANT 7MM STERILE (Peek) ×1 IMPLANT
PIN DISTRACTION 14 (PIN) ×2 IMPLANT
PUTTY BONE DBX 2.5 MIS (Bone Implant) ×1 IMPLANT
SCREW SELF DRILLING 16MM (Screw) ×2 IMPLANT
SPONGE INTESTINAL PEANUT (DISPOSABLE) ×4 IMPLANT
SPONGE SURGIFOAM ABS GEL 100 (HEMOSTASIS) IMPLANT
STRIP CLOSURE SKIN 1/2X4 (GAUZE/BANDAGES/DRESSINGS) ×2 IMPLANT
SURGIFLO TRUKIT (HEMOSTASIS) IMPLANT
SUT BONE WAX W31G (SUTURE) ×2 IMPLANT
SUT MNCRL AB 4-0 PS2 18 (SUTURE) ×1 IMPLANT
SUT SILK 4 0 (SUTURE)
SUT SILK 4-0 18XBRD TIE 12 (SUTURE) IMPLANT
SUT VIC AB 2-0 CT2 18 VCP726D (SUTURE) ×2 IMPLANT
SYR BULB IRRIGATION 50ML (SYRINGE) ×2 IMPLANT
SYR CONTROL 10ML LL (SYRINGE) ×4 IMPLANT
TAPE CLOTH 4X10 WHT NS (GAUZE/BANDAGES/DRESSINGS) ×2 IMPLANT
TAPE CLOTH SURG 4X10 WHT LF (GAUZE/BANDAGES/DRESSINGS) ×1 IMPLANT
TAPE UMBILICAL COTTON 1/8X30 (MISCELLANEOUS) ×2 IMPLANT
TOWEL OR 17X24 6PK STRL BLUE (TOWEL DISPOSABLE) ×2 IMPLANT
TOWEL OR 17X26 10 PK STRL BLUE (TOWEL DISPOSABLE) ×2 IMPLANT
WATER STERILE IRR 1000ML POUR (IV SOLUTION) ×1 IMPLANT
YANKAUER SUCT BULB TIP NO VENT (SUCTIONS) ×2 IMPLANT

## 2014-04-11 NOTE — Transfer of Care (Signed)
Immediate Anesthesia Transfer of Care Note  Patient: Sara Wolfe  Procedure(s) Performed: Procedure(s) with comments: ANTERIOR CERVICAL DECOMPRESSION/DISCECTOMY FUSION 1 LEVEL (N/A) - Anterior cervical decompression fusion, cervical 7-thoracic 1 with instrumentation and allograft  Patient Location: PACU  Anesthesia Type:General  Level of Consciousness: awake, alert  and oriented  Airway & Oxygen Therapy: Patient Spontanous Breathing and Patient connected to nasal cannula oxygen  Post-op Assessment: Report given to RN, Post -op Vital signs reviewed and stable and Patient moving all extremities X 4  Post vital signs: Reviewed and stable  Last Vitals:  Filed Vitals:   04/11/14 0616  BP: 137/66  Pulse: 76  Temp: 37.2 C  Resp: 16    Complications: No apparent anesthesia complications

## 2014-04-11 NOTE — Progress Notes (Signed)
Orthopedic Tech Progress Note Patient Details:  Sara Wolfe [birth year] [id number]  Ortho Devices Type of Ortho Device: Philadelphia cervical collar Ortho Device/Splint Location: at bedside Ortho Device/Splint Interventions: Ordered, Application   Braulio Bosch 04/11/2014, 8:22 PM

## 2014-04-11 NOTE — Anesthesia Postprocedure Evaluation (Signed)
  Anesthesia Post-op Note  Patient: Sara Wolfe  Procedure(s) Performed: Procedure(s) with comments: ANTERIOR CERVICAL DECOMPRESSION/DISCECTOMY FUSION 1 LEVEL (N/A) - Anterior cervical decompression fusion, cervical 7-thoracic 1 with instrumentation and allograft  Patient Location: PACU  Anesthesia Type:General  Level of Consciousness: awake  Airway and Oxygen Therapy: Patient Spontanous Breathing  Post-op Pain: mild  Post-op Assessment: Post-op Vital signs reviewed and Patient's Cardiovascular Status Stable  Post-op Vital Signs: Reviewed and stable  Last Vitals:  Filed Vitals:   04/11/14 1145  BP:   Pulse: 72  Temp:   Resp: 17    Complications: No apparent anesthesia complications

## 2014-04-11 NOTE — Anesthesia Procedure Notes (Signed)
Procedure Name: Intubation Date/Time: 04/11/2014 7:30 AM Performed by: Neldon Newport Pre-anesthesia Checklist: Patient being monitored, Suction available, Emergency Drugs available, Patient identified and Timeout performed Patient Re-evaluated:Patient Re-evaluated prior to inductionOxygen Delivery Method: Circle system utilized Preoxygenation: Pre-oxygenation with 100% oxygen Intubation Type: IV induction Ventilation: Mask ventilation without difficulty Laryngoscope Size: Mac and 3 Grade View: Grade I Tube type: Oral Tube size: 7.0 mm Number of attempts: 1 Placement Confirmation: positive ETCO2,  ETT inserted through vocal cords under direct vision and breath sounds checked- equal and bilateral Secured at: 20 cm Tube secured with: Tape Dental Injury: Teeth and Oropharynx as per pre-operative assessment

## 2014-04-12 ENCOUNTER — Encounter (HOSPITAL_COMMUNITY): Payer: Self-pay | Admitting: Orthopedic Surgery

## 2014-04-12 DIAGNOSIS — M5023 Other cervical disc displacement, cervicothoracic region: Secondary | ICD-10-CM | POA: Diagnosis not present

## 2014-04-12 NOTE — Op Note (Signed)
NAMEMarland Kitchen  Sara, Wolfe NO.:  [id number]  MEDICAL RECORD NO.:  [id number]                                  FACILITY:  PHYSICIAN:  Phylliss Bob, MD      DATE OF BIRTH:  18-Jun-1960  DATE OF PROCEDURE:  04/11/2014 DATE OF DISCHARGE:  04/12/2014                              OPERATIVE REPORT   PREOPERATIVE DIAGNOSES: 1. Left-sided C8 radiculopathy. 2. 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 3. 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  POSTOPERATIVE DIAGNOSES: 1. Left-sided C8 radiculopathy. 2. 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 3. 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  PROCEDURES: 1. Anterior cervical decompression and fusion C7/T1. 2. Placement of anterior instrumentation C7/T1. 3. Insertion of interbody device x1 (7 mm 0-P intervertebral spacer). 4. Use of morselized allograft-DBX mix. 5. Intraoperative use of fluoroscopy. 6. Removal of anterior instrumentation (bilateral C7 vertebral body     screws from the patient's previously placed hardware).  SURGEON:  Phylliss Bob, MD.  ASSISTANTPricilla Holm, PA-C.  ANESTHESIA:  General endotracheal anesthesia.  COMPLICATIONS:  None.  DISPOSITION:  Stable.  ESTIMATED BLOOD LOSS:  Minimal.  INDICATIONS FOR SURGERY:  Briefly, Sara Wolfe is a pleasant 54 year old female, who is status  post a previous C4-C7 anterior cervical decompression and fusion by me.  The patient did excellent from the surgery and did go on to have an onset of left-sided arm pain.  An MRI was reviewed and notable for a new left-sided C7-T1 disk herniation, clearly compressing the left C8 nerve.  The patient did fail conservative care, and we did discuss proceeding with the surgery noted above.  The patient did fully understand the risks  and limitations of the procedure and did elect to proceed with surgery.  OPERATIVE DETAILS:  On 2/18.2016, the patient was brought to surgery and general endotracheal anesthesia was administered.  The patient was placed supine on the hospital bed.  The neck was gently extended.  The arms were  secured to her sides and all bony prominences were meticulously padded.  Time-out was performed.  I then made a right-sided transverse incision  overlying the C7-T1 region.  The plane between the sternocleidomastoid muscle and the strap muscles was identified and explored.  The anterior spine was noted.  A self-retaining retractor was placed.  I then placed Caspar pins into the C7 and T1 vertebral bodies and distraction was applied.  I then used a 15-blade knife to perform an annulotomy.  I then used a series of curettes and pituitary rongeurs to perform a thorough and complete C7/T1 intervertebral diskectomy.  The posterior longitudinal ligament was identified and entered using a nerve hook.  I then used a series of Kerrison punches to perform a thorough bilateral neuroforaminal decompression.  I was extremely pleased with the decompression that I was able to accomplish of the exiting left C8 nerve.  The endplates were then prepared.  I then placed a series of intervertebral spacer trials.  I did feel that a 7 mm trial would be the most appropriate fit.  The appropriate sized 0P intervertebral spacer was then packed with the DBX mix and tamped into position in the usual fashion.  At this point, I did feel that the C7 vertebral body screws from the previously placed hardware would come in the way of the instrumentation that I was about to place.  I therefore removed the bilateral C7 vertebral body screws.  Bone wax was placed in the holes. The Caspar pins were then removed and the bone wax was also placed in the holes.  I then placed a 16 mm vertebral body screw to the anterior plate at C7, and at  T1.  I was very pleased with the press fit of the screws.  The screws were noted to be locked in the locking mechanism. The wound was then copiously irrigated.  I then obtained AP and lateral fluoroscopic images, and I was very pleased with the appearance.  The platysma was then closed using 2-0 Vicryl.  The skin was then closed using 3-0 Monocryl.  Benzoin and Steri-Strips were applied followed by sterile dressing.  All instrument counts were correct at the termination of the procedure.  Of note, Sara Wolfe was my assistant throughout the surgery, and did aid in retraction, suctioning, and closure.     Phylliss Bob, MD     MD/MEDQ  D:  04/11/2014  T:  04/12/2014  Job:  [id number]  cc:   Silverio Decamp, MD

## 2014-04-12 NOTE — Progress Notes (Signed)
    Patient doing well, resolved arm pain, throat sore with mild swallowing difficulties and hoarsness. Has been able to eat, up to bathroom and ambulating.   Physical Exam: BP 106/77 mmHg  Pulse 75  Temp(Src) 98.2 F (36.8 C) (Oral)  Resp 18  SpO2 97%  Dressing in place, CDI, hard collar worn appropriately, SCD's in place, NVI, philly collar at bedside  POD #1 s/p C7-T1 ACDF for adjacent segment L Radiculopathy  - Resolved L arm pain, minimal residual numbness - Expected PO throat soreness from retraction - up with PT/OT, encourage ambulation - Percocet for pain, Valium for muscle spasms - d/c home today - F/U in office 2 weeks  - Philly collar for showering after 5 days

## 2014-04-12 NOTE — Progress Notes (Signed)
UR completed 

## 2014-04-12 NOTE — Progress Notes (Signed)
Pt given D/C instructions with Rx's, verbal understanding was provided. Pt's incision is covered with gauze dressing and has no sign of infection. Pt's IV was removed prior to D/C. Pt D/C'd home via wheelchair @ 1105 per MD order. Pt is stable @ D/C and has no other needs at this time. Holli Humbles, RN

## 2014-04-18 NOTE — Discharge Summary (Signed)
Patient ID: Sara Wolfe MRN: [id number] DOB/AGE: 07/17/1960 54 y.o.  Admit date: 04/11/2014 Discharge date: 04/12/2014  Admission Diagnoses:  Active Problems:   Radiculopathy   Discharge Diagnoses:  Same  Past Medical History  Diagnosis Date  . Medical history non-contributory   . PONV (postoperative nausea and vomiting)   . GERD (gastroesophageal reflux disease)   . Hot flashes, menopausal     Surgeries: Procedure(s): ANTERIOR CERVICAL DECOMPRESSION/DISCECTOMY FUSION 1 LEVEL C7-T1 on 04/11/2014   Consultants:  none  Discharged Condition: Improved  Hospital Course: Sara Wolfe is an 54 y.o. female who was admitted 04/11/2014 for operative treatment of radiculopathy. Patient has severe unremitting pain that affects sleep, daily activities, and work/hobbies. After pre-op clearance the patient was taken to the operating room on 04/11/2014 and underwent  Procedure(s): ANTERIOR CERVICAL DECOMPRESSION/DISCECTOMY FUSION 1 LEVEL C7-T1.    Patient was given perioperative antibiotics:  Anti-infectives    Start     Dose/Rate Route Frequency Ordered Stop   04/11/14 1400  ceFAZolin (ANCEF) IVPB 1 g/50 mL premix     1 g 100 mL/hr over 30 Minutes Intravenous Every 8 hours 04/11/14 1358 04/11/14 2235   04/11/14 0600  ceFAZolin (ANCEF) IVPB 2 g/50 mL premix     2 g 100 mL/hr over 30 Minutes Intravenous On call to O.R. 04/10/14 1356 04/11/14 0742       Patient was given sequential compression devices, early ambulation to prevent DVT.  Patient benefited maximally from hospital stay and there were no complications.    Recent vital signs: BP 109/58 mmHg  Pulse 77  Temp(Src) 98.4 F (36.9 C) (Oral)  Resp 16  SpO2 98%  Discharge Medications:     Medication List    TAKE these medications        gabapentin 600 MG tablet  Commonly known as:  NEURONTIN  1-2 tabs PO TID     phentermine 37.5 MG capsule  One capsule by mouth at 11 AM     pregabalin 50 MG capsule  Commonly  known as:  LYRICA  Take 50 mg by mouth 2 (two) times daily.     venlafaxine XR 150 MG 24 hr capsule  Commonly known as:  EFFEXOR-XR  Take 150 mg by mouth daily with breakfast.        Diagnostic Studies: Dg Cervical Spine 2-3 Views  04/11/2014   CLINICAL DATA:  Status post anterior cervical decompression and discectomy and fusion at C7-T1  EXAM: DG C-ARM 61-120 MIN; CERVICAL SPINE - 2-3 VIEW  FLUOROSCOPY TIME:  Fluoroscopy Time (in minutes and seconds): 0 minutes, 5 seconds  Number of Acquired Images:  3  COMPARISON:  MRI of the cervical spine of March 21, 2014 and cervical spine series of January 18, 2014.  FINDINGS: The patient has undergone previous anterior cervical fusion from C4 through C7. There is been interval placement of a fusion type device with 2 cortical screws anteriorly at T1.  IMPRESSION: Anterior fusion at C7-T1 without evidence of immediate postprocedure complication.   Electronically Signed   By: David  Martinique   On: 04/11/2014 11:35   Dg C-arm 1-60 Min  04/11/2014   CLINICAL DATA:  Status post anterior cervical decompression and discectomy and fusion at C7-T1  EXAM: DG C-ARM 61-120 MIN; CERVICAL SPINE - 2-3 VIEW  FLUOROSCOPY TIME:  Fluoroscopy Time (in minutes and seconds): 0 minutes, 5 seconds  Number of Acquired Images:  3  COMPARISON:  MRI of the cervical spine of March 21, 2014 and cervical spine series of January 18, 2014.  FINDINGS: The patient has undergone previous anterior cervical fusion from C4 through C7. There is been interval placement of a fusion type device with 2 cortical screws anteriorly at T1.  IMPRESSION: Anterior fusion at C7-T1 without evidence of immediate postprocedure complication.   Electronically Signed   By: David  Martinique   On: 04/11/2014 11:35    Disposition: 01-Home or Self Care   POD #1 s/p C7-T1 ACDF for adjacent segment L Radiculopathy  - Resolved L arm pain, minimal residual numbness - Expected PO throat soreness from retraction - up  with PT/OT, encourage ambulation - Percocet for pain, Valium for muscle spasms - d/c home today - F/U in office 2 weeks  - Philly collar for showering after 5 days  -Written scripts for pain signed and in chart  Signed: Justice Britain 04/18/2014, 12:29 PM

## 2014-04-24 NOTE — Op Note (Signed)
NAMEMarland Kitchen  Sara, Wolfe NO.:  [id number]  MEDICAL RECORD NO.:  [id number]  LOCATION:  25C02C                        FACILITY:  [facility]  PHYSICIAN:  Phylliss Bob, MD      DATE OF BIRTH:  October 16, 1960  DATE OF PROCEDURE:  04/11/2014                              OPERATIVE REPORT   ADDENDUM:  Briefly, Ms. Sara Wolfe did have surgery by me on April 11, 2014.  I would refer to the operative report from that date for full description of the preoperative and postoperative diagnosis, history of present illness, procedure details, etc.  Of note, the transcriptionist did reference under "indications for surgery" that the patient was "[name]."  This was an error.  The correct name should have been "Ms. Sara Wolfe." Again, there was an error made and this addendum is to correct the medical record, as the patient was Ms. Sara Wolfe and was not [name].     Phylliss Bob, MD     MD/MEDQ  D:  04/23/2014  T:  04/24/2014  Job:  [id number]

## 2014-05-03 ENCOUNTER — Encounter: Payer: Self-pay | Admitting: Sports Medicine

## 2014-05-03 ENCOUNTER — Ambulatory Visit (INDEPENDENT_AMBULATORY_CARE_PROVIDER_SITE_OTHER): Payer: 59 | Admitting: Sports Medicine

## 2014-05-03 DIAGNOSIS — E669 Obesity, unspecified: Secondary | ICD-10-CM | POA: Diagnosis not present

## 2014-05-03 DIAGNOSIS — M5416 Radiculopathy, lumbar region: Secondary | ICD-10-CM | POA: Diagnosis not present

## 2014-05-03 DIAGNOSIS — Z0000 Encounter for general adult medical examination without abnormal findings: Secondary | ICD-10-CM

## 2014-05-03 MED ORDER — LORCASERIN HCL 10 MG PO TABS: 1.0000 | ORAL_TABLET | Freq: Two times a day (BID) | ORAL | Status: DC

## 2014-05-03 MED ORDER — PHENTERMINE HCL 37.5 MG PO TABS: ORAL_TABLET | ORAL | Status: DC

## 2014-05-03 NOTE — Assessment & Plan Note (Signed)
Phentermine one half tab twice a day, also starting Belviq.

## 2014-05-03 NOTE — Assessment & Plan Note (Signed)
Up-to-date on cervical cancer screening, ordering a mammogram.

## 2014-05-03 NOTE — Progress Notes (Signed)
  Subjective:    CC: Follow-up  HPI: Post C7-T1 ACDF: Is now 3 weeks post ACDF, eager to get her cervical collar off, she has been wearing it for almost 2 weeks now. Pain-free.  Left thigh pain: Burning, and tingling, in an L3 versus an L4 distribution with mild back pain, moderate, persistent. No bowel or bladder dysfunction or saddle numbness. She has already had x-rays, symptoms are persistent now for 4 weeks.  Obesity: Desires to restart phentermine.  Lower extremity edema: Agrees to discuss this in further detail a future visit.  Preventive measures: Up-to-date on cervical cancer screening, due for mammogram.  Past medical history, Surgical history, Family history not pertinant except as noted below, Social history, Allergies, and medications have been entered into the medical record, reviewed, and no changes needed.   Review of Systems: No fevers, chills, night sweats, weight loss, chest pain, or shortness of breath.   Objective:    General: Well Developed, well nourished, and in no acute distress.  Neuro: Alert and oriented x3, extra-ocular muscles intact, sensation grossly intact.  HEENT: Normocephalic, atraumatic, pupils equal round reactive to light, neck supple, no masses, no lymphadenopathy, thyroid nonpalpable.  Skin: Warm and dry, no rashes. Cardiac: Regular rate and rhythm, no murmurs rubs or gallops, no lower extremity edema.  Respiratory: Clear to auscultation bilaterally. Not using accessory muscles, speaking in full sentences.  Impression and Recommendations:

## 2014-05-03 NOTE — Assessment & Plan Note (Signed)
Clinically represents left L3 versus L4. She has already had x-rays, symptoms are new, we are going to obtain an MRI for interventional planning.

## 2014-05-06 ENCOUNTER — Telehealth: Payer: Self-pay

## 2014-05-06 NOTE — Telephone Encounter (Signed)
Patient left a message  Stating  that her insurance  would not cover her Belviq, I called patient back to advise patient that she is to use the discount coupon that was given when she was in the office but I was unable to reach patient so I left a message for her to return my call. Rhonda Cunningham,CMA

## 2014-05-08 ENCOUNTER — Ambulatory Visit (INDEPENDENT_AMBULATORY_CARE_PROVIDER_SITE_OTHER): Payer: 59

## 2014-05-08 DIAGNOSIS — Z1231 Encounter for screening mammogram for malignant neoplasm of breast: Secondary | ICD-10-CM

## 2014-05-09 ENCOUNTER — Other Ambulatory Visit: Payer: 59

## 2014-05-20 ENCOUNTER — Ambulatory Visit (INDEPENDENT_AMBULATORY_CARE_PROVIDER_SITE_OTHER): Payer: 59

## 2014-05-20 DIAGNOSIS — M4316 Spondylolisthesis, lumbar region: Secondary | ICD-10-CM

## 2014-05-20 DIAGNOSIS — M129 Arthropathy, unspecified: Secondary | ICD-10-CM | POA: Diagnosis not present

## 2014-05-20 DIAGNOSIS — M5126 Other intervertebral disc displacement, lumbar region: Secondary | ICD-10-CM | POA: Diagnosis not present

## 2014-05-20 DIAGNOSIS — M5416 Radiculopathy, lumbar region: Secondary | ICD-10-CM

## 2014-05-31 ENCOUNTER — Ambulatory Visit: Payer: 59 | Admitting: Sports Medicine

## 2014-08-15 ENCOUNTER — Other Ambulatory Visit: Payer: Self-pay | Admitting: Sports Medicine

## 2014-08-15 DIAGNOSIS — N951 Menopausal and female climacteric states: Secondary | ICD-10-CM

## 2014-08-15 MED ORDER — GABAPENTIN 600 MG PO TABS: 1200.0000 mg | ORAL_TABLET | Freq: Three times a day (TID) | ORAL | Status: DC

## 2014-08-27 ENCOUNTER — Ambulatory Visit (INDEPENDENT_AMBULATORY_CARE_PROVIDER_SITE_OTHER): Payer: 59 | Admitting: Sports Medicine

## 2014-08-27 ENCOUNTER — Encounter: Payer: Self-pay | Admitting: Sports Medicine

## 2014-08-27 VITALS — BP 136/81 | HR 77 | Temp 98.5°F | Wt 231.0 lb

## 2014-08-27 DIAGNOSIS — M5416 Radiculopathy, lumbar region: Secondary | ICD-10-CM

## 2014-08-27 DIAGNOSIS — E669 Obesity, unspecified: Secondary | ICD-10-CM | POA: Diagnosis not present

## 2014-08-27 DIAGNOSIS — Z981 Arthrodesis status: Secondary | ICD-10-CM | POA: Diagnosis not present

## 2014-08-27 MED ORDER — VENLAFAXINE HCL ER 150 MG PO CP24: 150.0000 mg | ORAL_CAPSULE | Freq: Every day | ORAL | Status: DC

## 2014-08-27 MED ORDER — GABAPENTIN 800 MG PO TABS: ORAL_TABLET | ORAL | Status: DC

## 2014-08-27 MED ORDER — METHOCARBAMOL 500 MG PO TABS: 500.0000 mg | ORAL_TABLET | Freq: Four times a day (QID) | ORAL | Status: DC

## 2014-08-27 MED ORDER — PHENTERMINE HCL 37.5 MG PO TABS: ORAL_TABLET | ORAL | Status: DC

## 2014-08-27 NOTE — Assessment & Plan Note (Signed)
Left L4-L5 protrusion likely affecting the extra foraminal L4 nerve root. Left-sided L4-L5 transforaminal epidural ordered. Increasing gabapentin.

## 2014-08-27 NOTE — Progress Notes (Signed)
  Subjective:    CC: Follow-up  HPI: Obesity: Missed a few months still lost weight.  Left lumbar radiculopathy: Left-sided L4-L5 protrusion likely affecting the extra foraminal L4 nerve root, overall she's doing well with gabapentin and is amenable to simply increase the dose.  Status post cervical fusion: Unfortunately continues to have C8 radiculopathy, this was confirmed on a nerve conduction study, and considered permanent by Dr. Lynann Bologna, gabapentin has been effective.  Past medical history, Surgical history, Family history not pertinant except as noted below, Social history, Allergies, and medications have been entered into the medical record, reviewed, and no changes needed.   Review of Systems: No fevers, chills, night sweats, weight loss, chest pain, or shortness of breath.   Objective:    General: Well Developed, well nourished, and in no acute distress.  Neuro: Alert and oriented x3, extra-ocular muscles intact, sensation grossly intact.  HEENT: Normocephalic, atraumatic, pupils equal round reactive to light, neck supple, no masses, no lymphadenopathy, thyroid nonpalpable.  Skin: Warm and dry, no rashes. Cardiac: Regular rate and rhythm, no murmurs rubs or gallops, no lower extremity edema.  Respiratory: Clear to auscultation bilaterally. Not using accessory muscles, speaking in full sentences.  MRI results as dictated above.  Impression and Recommendations:

## 2014-08-27 NOTE — Assessment & Plan Note (Signed)
With persistent radiculopathy on nerve conduction and a C8 distribution. This is now permanent. Gabapentin eases the symptoms well.

## 2014-08-27 NOTE — Assessment & Plan Note (Signed)
Missed a few months but still lost weight. Refilling phentermine for the second month. Return monthly.

## 2014-09-09 ENCOUNTER — Ambulatory Visit
Admission: RE | Admit: 2014-09-09 | Discharge: 2014-09-09 | Disposition: A | Discharge status: Home / Self Care | Payer: 59 | Source: Ambulatory Visit | Attending: Sports Medicine | Admitting: Sports Medicine

## 2014-09-09 MED ORDER — IOHEXOL 180 MG/ML  SOLN
1.0000 mL | Freq: Once | INTRAMUSCULAR | Status: AC | PRN
  Administered 2014-09-09: 1 mL via INTRAVENOUS

## 2014-09-09 MED ORDER — METHYLPREDNISOLONE ACETATE 40 MG/ML INJ SUSP (RADIOLOG
120.0000 mg | Freq: Once | INTRAMUSCULAR | Status: AC
  Administered 2014-09-09: 120 mg via EPIDURAL

## 2014-09-09 NOTE — Discharge Instructions (Signed)

## 2015-02-23 HISTORY — PX: UPPER GI ENDOSCOPY: SHX6162

## 2015-02-23 HISTORY — PX: COLONOSCOPY: SHX174

## 2015-05-09 ENCOUNTER — Ambulatory Visit (INDEPENDENT_AMBULATORY_CARE_PROVIDER_SITE_OTHER): Payer: Managed Care, Other (non HMO) | Admitting: Sports Medicine

## 2015-05-09 ENCOUNTER — Encounter: Payer: Self-pay | Admitting: Sports Medicine

## 2015-05-09 VITALS — BP 135/93 | HR 96 | Resp 18 | Wt 229.5 lb

## 2015-05-09 DIAGNOSIS — R1013 Epigastric pain: Secondary | ICD-10-CM | POA: Insufficient documentation

## 2015-05-09 DIAGNOSIS — M7712 Lateral epicondylitis, left elbow: Secondary | ICD-10-CM

## 2015-05-09 DIAGNOSIS — K2970 Gastritis, unspecified, without bleeding: Secondary | ICD-10-CM | POA: Diagnosis not present

## 2015-05-09 MED ORDER — MELOXICAM 15 MG PO TABS: ORAL_TABLET | ORAL | Status: DC

## 2015-05-09 MED ORDER — RANITIDINE HCL 300 MG PO TABS
300.0000 mg | ORAL_TABLET | Freq: Two times a day (BID) | ORAL | Status: DC
Start: 2015-05-09 — End: 2015-10-16

## 2015-05-09 NOTE — Assessment & Plan Note (Signed)
Checking blood work, adding ranitidine, Zofran.

## 2015-05-09 NOTE — Progress Notes (Signed)
  Subjective:    CC:  Follow-up  HPI: Left elbow pain: Present for a month, works at [employer] He is moderate, persistent, localized at the lateral condyle without radiation. She works on an Designer, television/film set with repetitive motion.  Nausea and vomiting: Present for a week, epigastric abdominal pain with no hematemesis, hematochezia, melena. Low-grade fevers.   Possible symptom is nausea, vomiting. Abdominal pain.  Past medical history, Surgical history, Family history not pertinant except as noted below, Social history, Allergies, and medications have been entered into the medical record, reviewed, and no changes needed.   Review of Systems: No fevers, chills, night sweats, weight loss, chest pain, or shortness of breath.   Objective:    General: Well Developed, well nourished, and in no acute distress.  Neuro: Alert and oriented x3, extra-ocular muscles intact, sensation grossly intact.  HEENT: Normocephalic, atraumatic, pupils equal round reactive to light, neck supple, no masses, no lymphadenopathy, thyroid nonpalpable.  Skin: Warm and dry, no rashes. Cardiac: Regular rate and rhythm, no murmurs rubs or gallops, no lower extremity edema.  Respiratory: Clear to auscultation bilaterally. Not using accessory muscles, speaking in full sentences. Left Elbow: Unremarkable to inspection. Range of motion full pronation, supination, flexion, extension. Strength is full to all of the above directions Stable to varus, valgus stress. Negative moving valgus stress test.  tender to palpation of the common extensor tendon origin Ulnar nerve does not sublux. Negative cubital tunnel Tinel's. Abdomen: Soft, nontender, nondistended, no bowel sounds, no palpable masses, no guarding, rigidity, rebound tenderness.  Impression and Recommendations:

## 2015-05-09 NOTE — Assessment & Plan Note (Signed)
Counter force brace, meloxicam, rehabilitation exercises, return in one month, injection if no better.

## 2015-05-16 ENCOUNTER — Encounter: Payer: Self-pay | Admitting: Sports Medicine

## 2015-05-16 ENCOUNTER — Ambulatory Visit (INDEPENDENT_AMBULATORY_CARE_PROVIDER_SITE_OTHER): Payer: Managed Care, Other (non HMO) | Admitting: Sports Medicine

## 2015-05-16 VITALS — BP 135/82 | HR 92 | Temp 98.6°F | Resp 18 | Ht 61.5 in | Wt 228.2 lb

## 2015-05-16 DIAGNOSIS — Z0000 Encounter for general adult medical examination without abnormal findings: Secondary | ICD-10-CM | POA: Diagnosis not present

## 2015-05-16 DIAGNOSIS — G43809 Other migraine, not intractable, without status migrainosus: Secondary | ICD-10-CM | POA: Diagnosis not present

## 2015-05-16 DIAGNOSIS — G43909 Migraine, unspecified, not intractable, without status migrainosus: Secondary | ICD-10-CM | POA: Insufficient documentation

## 2015-05-16 DIAGNOSIS — M5416 Radiculopathy, lumbar region: Secondary | ICD-10-CM

## 2015-05-16 DIAGNOSIS — E785 Hyperlipidemia, unspecified: Secondary | ICD-10-CM

## 2015-05-16 DIAGNOSIS — G4733 Obstructive sleep apnea (adult) (pediatric): Secondary | ICD-10-CM | POA: Diagnosis not present

## 2015-05-16 DIAGNOSIS — R1013 Epigastric pain: Secondary | ICD-10-CM | POA: Diagnosis not present

## 2015-05-16 LAB — CBC
HCT: 46.7 % — ABNORMAL HIGH (ref 36.0–46.0)
Hemoglobin: 15.6 g/dL — ABNORMAL HIGH (ref 12.0–15.0)
MCH: 30.1 pg (ref 26.0–34.0)
MCHC: 33.4 g/dL (ref 30.0–36.0)
MCV: 90.2 fL (ref 78.0–100.0)
MPV: 9.8 fL (ref 8.6–12.4)
Platelets: 318 K/uL (ref 150–400)
RBC: 5.18 MIL/uL — ABNORMAL HIGH (ref 3.87–5.11)
RDW: 14 % (ref 11.5–15.5)
WBC: 8.5 K/uL (ref 4.0–10.5)

## 2015-05-16 LAB — HEPATITIS C ANTIBODY: HCV Ab: NEGATIVE

## 2015-05-16 LAB — TSH: TSH: 1.44 m[IU]/L

## 2015-05-16 LAB — HEMOGLOBIN A1C
Hgb A1c MFr Bld: 5.7 % — ABNORMAL HIGH (ref <5.7)
Mean Plasma Glucose: 117 mg/dL — ABNORMAL HIGH (ref <117)

## 2015-05-16 MED ORDER — GI COCKTAIL ~~LOC~~
30.0000 mL | Freq: Once | ORAL | Status: AC
  Administered 2015-05-16: 30 mL via ORAL

## 2015-05-16 MED ORDER — TOPIRAMATE 50 MG PO TABS: ORAL_TABLET | ORAL | Status: DC

## 2015-05-16 MED ORDER — VENLAFAXINE HCL ER 150 MG PO CP24: 150.0000 mg | ORAL_CAPSULE | Freq: Every day | ORAL | Status: DC

## 2015-05-16 MED ORDER — RIZATRIPTAN BENZOATE 10 MG PO TBDP: 10.0000 mg | ORAL_TABLET | ORAL | Status: DC | PRN

## 2015-05-16 NOTE — Assessment & Plan Note (Signed)
Referral to the sleep lab.

## 2015-05-16 NOTE — Progress Notes (Signed)
  Subjective:    CC: Complete physical   HPI:  Preventive measures: Due for routine blood work.  Epigastric pain: Moderate, persistent, no improvement despite ranitidine 300, no melena, hematochezia, hematemesis. Agreeable to consult gastroenterology. Pain is not in, epigastric and worse after eating. It is not described as crampy or colicky.  Headaches: Severe and occurs every couple of months, the last for several days, bitemporal, dull, and associated with photophobia, phonophobia, and nausea.  Past medical history, Surgical history, Family history not pertinant except as noted below, Social history, Allergies, and medications have been entered into the medical record, reviewed, and no changes needed.   Review of Systems: No headache, visual changes, nausea, vomiting, diarrhea, constipation, dizziness, abdominal pain, skin rash, fevers, chills, night sweats, swollen lymph nodes, weight loss, chest pain, body aches, joint swelling, muscle aches, shortness of breath, mood changes, visual or auditory hallucinations.  Objective:    General: Well Developed, well nourished, and in no acute distress.  Neuro: Alert and oriented x3, extra-ocular muscles intact, sensation grossly intact. Cranial nerves II through XII are intact, motor, sensory, and coordinative functions are all intact. HEENT: Normocephalic, atraumatic, pupils equal round reactive to light, neck supple, no masses, no lymphadenopathy, thyroid nonpalpable. Oropharynx, nasopharynx, external ear canals are unremarkable. Skin: Warm and dry, no rashes noted.  Cardiac: Regular rate and rhythm, no murmurs rubs or gallops.  Respiratory: Clear to auscultation bilaterally. Not using accessory muscles, speaking in full sentences.  Abdominal: Soft, tender in the epigastrium, nondistended, positive bowel sounds, no masses, no organomegaly.  Musculoskeletal: Shoulder, elbow, wrist, hip, knee, ankle stable, and with full range of  motion.  Impression and Recommendations:    The patient was counselled, risk factors were discussed, anticipatory guidance given.

## 2015-05-16 NOTE — Assessment & Plan Note (Signed)
Complete physical as above. Checking routine blood work including hepatitis C

## 2015-05-16 NOTE — Assessment & Plan Note (Signed)
Starting Topamax, adding Maxalt for abortive treatment.

## 2015-05-16 NOTE — Assessment & Plan Note (Signed)
Persistent symptoms now despite PPIs and high-dose Zantac twice a day. GI cocktail given today. Next line referral to gastroenterology for consideration of upper endoscopy.

## 2015-05-17 LAB — VITAMIN D 25 HYDROXY (VIT D DEFICIENCY, FRACTURES): Vit D, 25-Hydroxy: 21 ng/mL — ABNORMAL LOW (ref 30–100)

## 2015-05-20 ENCOUNTER — Telehealth: Payer: Self-pay

## 2015-05-20 DIAGNOSIS — E785 Hyperlipidemia, unspecified: Secondary | ICD-10-CM | POA: Insufficient documentation

## 2015-05-20 MED ORDER — FENOFIBRATE 160 MG PO TABS: 160.0000 mg | ORAL_TABLET | Freq: Every day | ORAL | Status: DC

## 2015-05-20 NOTE — Telephone Encounter (Signed)
Pt called and would like a letter to return to work today

## 2015-05-20 NOTE — Addendum Note (Signed)
Addended by: Silverio Decamp on: 05/20/2015 01:30 PM   Modules accepted: Orders

## 2015-05-20 NOTE — Assessment & Plan Note (Signed)
Predominantly hypertriglyceridemia, starting fenofibrate and recheck in 3 months.

## 2015-05-21 NOTE — Addendum Note (Signed)
Addended by: Silverio Decamp on: 05/21/2015 11:59 AM   Modules accepted: Orders, Medications

## 2015-05-23 LAB — LIPID PANEL
Cholesterol: 234 mg/dL — ABNORMAL HIGH (ref 125–200)
HDL: 62 mg/dL (ref >=46)
LDL Cholesterol: 148 mg/dL — ABNORMAL HIGH (ref <130)
Total CHOL/HDL Ratio: 3.8 ratio (ref <=5.0)
Triglycerides: 121 mg/dL (ref <150)
VLDL: 24 mg/dL (ref <30)

## 2015-05-23 LAB — COMPREHENSIVE METABOLIC PANEL
Albumin: 4 g/dL (ref 3.6–5.1)
BUN: 23 mg/dL (ref 7–25)
CO2: 22 mmol/L (ref 20–31)
Calcium: 9.2 mg/dL (ref 8.6–10.4)
Chloride: 105 mmol/L (ref 98–110)
Creat: 0.68 mg/dL (ref 0.50–1.05)
Potassium: 4.7 mmol/L (ref 3.5–5.3)
Sodium: 139 mmol/L (ref 135–146)
Total Protein: 6.7 g/dL (ref 6.1–8.1)

## 2015-05-23 LAB — COMPREHENSIVE METABOLIC PANEL WITH GFR
ALT: 15 U/L (ref 6–29)
AST: 14 U/L (ref 10–35)
Alkaline Phosphatase: 50 U/L (ref 33–130)
Glucose, Bld: 92 mg/dL (ref 65–99)
Total Bilirubin: 0.4 mg/dL (ref 0.2–1.2)

## 2015-06-13 ENCOUNTER — Ambulatory Visit: Payer: Managed Care, Other (non HMO) | Admitting: Sports Medicine

## 2015-07-07 ENCOUNTER — Other Ambulatory Visit: Payer: Self-pay

## 2015-07-07 DIAGNOSIS — G43809 Other migraine, not intractable, without status migrainosus: Secondary | ICD-10-CM

## 2015-07-07 MED ORDER — TOPIRAMATE 50 MG PO TABS: 50.0000 mg | ORAL_TABLET | Freq: Every day | ORAL | Status: DC

## 2015-07-21 ENCOUNTER — Encounter (HOSPITAL_BASED_OUTPATIENT_CLINIC_OR_DEPARTMENT_OTHER): Payer: Managed Care, Other (non HMO)

## 2015-08-13 ENCOUNTER — Other Ambulatory Visit: Payer: Self-pay | Admitting: Sports Medicine

## 2015-09-08 ENCOUNTER — Other Ambulatory Visit: Payer: Self-pay | Admitting: Sports Medicine

## 2015-09-09 ENCOUNTER — Ambulatory Visit (HOSPITAL_BASED_OUTPATIENT_CLINIC_OR_DEPARTMENT_OTHER): Payer: Commercial Managed Care - HMO | Attending: Sports Medicine | Admitting: Internal Medicine

## 2015-09-09 VITALS — Ht 61.0 in | Wt 240.0 lb

## 2015-09-09 DIAGNOSIS — G4719 Other hypersomnia: Secondary | ICD-10-CM | POA: Insufficient documentation

## 2015-09-09 DIAGNOSIS — G4733 Obstructive sleep apnea (adult) (pediatric): Secondary | ICD-10-CM | POA: Insufficient documentation

## 2015-09-09 DIAGNOSIS — R5383 Other fatigue: Secondary | ICD-10-CM | POA: Insufficient documentation

## 2015-09-09 DIAGNOSIS — R0683 Snoring: Secondary | ICD-10-CM | POA: Insufficient documentation

## 2015-09-09 DIAGNOSIS — Z79899 Other long term (current) drug therapy: Secondary | ICD-10-CM | POA: Insufficient documentation

## 2015-09-09 DIAGNOSIS — I493 Ventricular premature depolarization: Secondary | ICD-10-CM | POA: Insufficient documentation

## 2015-09-11 ENCOUNTER — Other Ambulatory Visit: Payer: Self-pay

## 2015-09-11 DIAGNOSIS — M7712 Lateral epicondylitis, left elbow: Secondary | ICD-10-CM

## 2015-09-11 MED ORDER — MELOXICAM 15 MG PO TABS: ORAL_TABLET | ORAL | Status: DC

## 2015-09-19 ENCOUNTER — Emergency Department (INDEPENDENT_AMBULATORY_CARE_PROVIDER_SITE_OTHER): Payer: Commercial Managed Care - HMO

## 2015-09-19 ENCOUNTER — Emergency Department
Admission: EM | Admit: 2015-09-19 | Discharge: 2015-09-19 | Disposition: A | Discharge status: Home / Self Care | Payer: Commercial Managed Care - HMO | Source: Home / Self Care | Attending: Family Medicine | Admitting: Family Medicine

## 2015-09-19 DIAGNOSIS — M25531 Pain in right wrist: Secondary | ICD-10-CM | POA: Diagnosis not present

## 2015-09-19 DIAGNOSIS — S63501A Unspecified sprain of right wrist, initial encounter: Secondary | ICD-10-CM

## 2015-09-19 NOTE — ED Provider Notes (Signed)
CSN: [id number]     Arrival date & time 09/19/15  0808 History   First MD Initiated Contact with Patient 09/19/15 [phone]     Chief Complaint  Patient presents with  . Wrist Pain   (Consider location/radiation/quality/duration/timing/severity/associated sxs/prior Treatment) HPI  Waynesha Besch is a 55 y.o. female presenting to UC with c/o Right wrist pain that started last night after twisting her wrist while holding onto a handrail of stairs to help catch herself from falling. Denies falling or hitting her wrist on anything. Pain is aching and sore with movement, 10/10 along ulnar aspect, however, 0/10 at rest. No other injuries. Pt is Right hand dominant. Denies numbness or weakness in Right hand. Denies elbow pain.   Past Medical History:  Diagnosis Date  . GERD (gastroesophageal reflux disease)   . Hot flashes, menopausal   . Medical history non-contributory   . PONV (postoperative nausea and vomiting)    Past Surgical History:  Procedure Laterality Date  . ANTERIOR CERVICAL DECOMP/DISCECTOMY FUSION N/A 10/04/2013   Procedure: ANTERIOR CERVICAL DECOMPRESSION/DISCECTOMY FUSION 3 LEVELS;  Surgeon: Sinclair Ship, MD;  Location: [facility];  Service: Orthopedics;  Laterality: N/A;  Anterior cervical decompression fusion cervical 4-5, cervical 5-6, cervical 6-7 with instrumentation and allograft  . ANTERIOR CERVICAL DECOMP/DISCECTOMY FUSION N/A 04/11/2014   Procedure: ANTERIOR CERVICAL DECOMPRESSION/DISCECTOMY FUSION 1 LEVEL;  Surgeon: Sinclair Ship, MD;  Location: [facility];  Service: Orthopedics;  Laterality: N/A;  Anterior cervical decompression fusion, cervical 7-thoracic 1 with instrumentation and allograft  . FOOT SURGERY    . TUBAL LIGATION     No family history on file. Social History  Substance Use Topics  . Smoking status: Former Smoker    Packs/day: 0.25    Years: 2.00    Types: Cigarettes    Quit date: 04/02/2012  . Smokeless tobacco: Never Used  . Alcohol use No   OB  History    No data available     Review of Systems  Musculoskeletal: Positive for arthralgias and myalgias. Negative for joint swelling.       Right wrist  Skin: Negative for color change and wound.  Neurological: Negative for weakness and numbness.    Allergies  Review of patient's allergies indicates no known allergies.  Home Medications   Prior to Admission medications   Medication Sig Start Date End Date Taking? Authorizing Provider  gabapentin (NEURONTIN) 800 MG tablet TAKE 1 TABLET EVERY MORNING AND MIDDAY AND TAKE 2 TABLETS AT BEDTIME 08/14/15   Silverio Decamp, MD  meloxicam (MOBIC) 15 MG tablet One tab PO qAM with breakfast for 2 weeks, then daily prn pain. 09/11/15   Silverio Decamp, MD  methocarbamol (ROBAXIN) 500 MG tablet TAKE 1 TABLET 4 TIMES A DAY 09/08/15   Silverio Decamp, MD  phentermine (ADIPEX-P) 37.5 MG tablet One tab by mouth at 11 AM 08/27/14   Silverio Decamp, MD  ranitidine (ZANTAC) 300 MG tablet Take 1 tablet (300 mg total) by mouth 2 (two) times daily. 05/09/15   Silverio Decamp, MD  rizatriptan (MAXALT-MLT) 10 MG disintegrating tablet Take 1 tablet (10 mg total) by mouth as needed for migraine. May repeat in 2 hours if needed 05/16/15   Silverio Decamp, MD  topiramate (TOPAMAX) 50 MG tablet Take 1 tablet (50 mg total) by mouth daily. 07/07/15   Silverio Decamp, MD  venlafaxine XR (EFFEXOR-XR) 150 MG 24 hr capsule Take 1 capsule (150 mg total) by mouth daily with breakfast.  05/16/15   Silverio Decamp, MD   Meds Ordered and Administered this Visit  Medications - No data to display  BP 119/81 (BP Location: Left Arm)   Pulse 73   Wt 242 lb (109.8 kg)   SpO2 97%   BMI 45.73 kg/m  No data found.   Physical Exam  Constitutional: She is oriented to person, place, and time. She appears well-developed and well-nourished.  HENT:  Head: Normocephalic and atraumatic.  Eyes: EOM are normal.  Neck: Normal range of motion.   Cardiovascular: Normal rate.   Pulmonary/Chest: Effort normal.  Musculoskeletal: Normal range of motion. She exhibits tenderness. She exhibits no edema or deformity.  Right wrist: no edema or deformity. Mild tenderness to ulnar aspect. Full flexion, limited extension due to pain. 5/5 grip strength bilaterally. No tenderness to Right hand.  Full ROM elbow w/o tenderness.   Neurological: She is alert and oriented to person, place, and time.  Skin: Skin is warm and dry.  Right hand and wrist: skin in tact, no ecchymosis or erythema  Psychiatric: She has a normal mood and affect. Her behavior is normal.  Nursing note and vitals reviewed.   Urgent Care Course   Clinical Course    Procedures (including critical care time)  Labs Review Labs Reviewed - No data to display  Imaging Review Dg Wrist Complete Right  Addendum Date: 09/19/2015   ADDENDUM REPORT: 09/19/2015 09:06 ADDENDUM: For sentence in the Impression should read: No fracture or dislocation. Electronically Signed   By: Lowella Grip III M.D.   On: 09/19/2015 09:06  Result Date: 09/19/2015 CLINICAL DATA:  Pain following fall EXAM: RIGHT WRIST - COMPLETE 3+ VIEW COMPARISON:  None. FINDINGS: Frontal, oblique, lateral, and ulnar deviation scaphoid images were obtained. There is no fracture or dislocation. The joint spaces appear normal. No erosive change. IMPRESSION: Fracture or dislocation.  No evident arthropathy. Electronically Signed: By: Lowella Grip III M.D. On: 09/19/2015 08:50    MDM   1. Wrist sprain, right, initial encounter    Pt c/o Right wrist pain after twisting injuyr. PMS in tact. Tenderness along ulnar aspect. Plain films: negative for fracture or dislocation.  Wrist splint provided, will treat as sprain. Encouraged rest, ice, compression and elevation.  Pt does not recall name of "pain medication" she took last night but states it was prescribed by her PCP for elbow pain. Will continue to take that  at home as needed. Encouraged f/u with PCP in 1-2 weeks if not improving, sooner if worsening.    Noland Fordyce, PA-C 09/19/15 [phone]

## 2015-09-19 NOTE — ED Triage Notes (Signed)
Pt reports falling down 3 stairs yesterday, injuring her right wrist. Denies previous injury. Took IBF @ home yesterday. No pain @ rest, 10/10 with movement. Declined in-house medication.

## 2015-09-23 NOTE — Procedures (Signed)
Patient Name: Sara Wolfe, Sara Wolfe Date: 09/09/2015 Gender: Female D.O.B: 1960/03/24 Age (years): 54 Referring Provider: Gwen Her Thekkekandam Height (inches): 61 Interpreting Physician: Baird Lyons MD, ABSM Weight (lbs): 240 RPSGT: Carolin Coy BMI: 45 MRN: [id number] Neck Size: 15.50 CLINICAL INFORMATION Sleep Study Type: Split Night CPAP Indication for sleep study: Excessive Daytime Sleepiness, Fatigue, OSA, Snoring Epworth Sleepiness Score: 9  SLEEP STUDY TECHNIQUE As per the AASM Manual for the Scoring of Sleep and Associated Events v2.3 (April 2016) with a hypopnea requiring 4% desaturations. The channels recorded and monitored were frontal, central and occipital EEG, electrooculogram (EOG), submentalis EMG (chin), nasal and oral airflow, thoracic and abdominal wall motion, anterior tibialis EMG, snore microphone, electrocardiogram, and pulse oximetry. Continuous positive airway pressure (CPAP) was initiated when the patient met split night criteria and was titrated according to treat sleep-disordered breathing.  MEDICATIONS Medications taken by the patient : charted for review Medications administered by patient during sleep study : GABAPENTIN, TOPAMAX, EFFEXOR XR  RESPIRATORY PARAMETERS Diagnostic Total AHI (/hr): 38.9 RDI (/hr): 47.2 OA Index (/hr): 4.3 CA Index (/hr): 0.0 REM AHI (/hr): 137.1 NREM AHI (/hr): 36.9 Supine AHI (/hr): 42.7 Non-supine AHI (/hr): 37.04 Min O2 Sat (%): 81.00 Mean O2 (%): 93.91 Time below 88% (min): 1.4   Titration Optimal Pressure (cm): 21 AHI at Optimal Pressure (/hr): 0.0 Min O2 at Optimal Pressure (%): 96.0 Supine % at Optimal (%): 0 Sleep % at Optimal (%): 97    SLEEP ARCHITECTURE The recording time for the entire night was 425.1 minutes. During a baseline period of 211.5 minutes, the patient slept for 180.5 minutes in REM and nonREM, yielding a sleep efficiency of 85.3%. Sleep onset after lights out was 6.4 minutes with a REM  latency of 201.5 minutes. The patient spent 13.02% of the night in stage N1 sleep, 85.04% in stage N2 sleep, 0.00% in stage N3 and 1.94% in REM. During the titration period of 209.1 minutes, the patient slept for 183.5 minutes in REM and nonREM, yielding a sleep efficiency of 87.8%. Sleep onset after CPAP initiation was 16.9 minutes with a REM latency of 77.5 minutes. The patient spent 5.72% of the night in stage N1 sleep, 60.22% in stage N2 sleep, 0.00% in stage N3 and 34.06% in REM.  CARDIAC DATA The 2 lead EKG demonstrated sinus rhythm. The mean heart rate was 69.83 beats per minute. Other EKG findings include: PVCs.  LEG MOVEMENT DATA The total Periodic Limb Movements of Sleep (PLMS) were 0. The PLMS index was 0.00 .  IMPRESSIONS - Severe obstructive sleep apnea occurred during the diagnostic portion of the study (AHI = 38.9/hour). An optimal PAP pressure was selected for this patient ( 21 cm of water) - Pressure requirement this high is unusual for females. Patient may need BILELVEL 21/ 17 for control.  - Consider ENT evaluation for correctable upper airway obstruction. - No significant central sleep apnea occurred during the diagnostic portion of the study (CAI = 0.0/hour). - Mild oxygen desaturation was noted during the diagnostic portion of the study (Min O2 = 81.00%). - The patient snored with Moderate snoring volume during the diagnostic portion of the study. - No cardiac abnormalities were noted during this study. - Clinically significant periodic limb movements did not occur during sleep.  DIAGNOSIS - Obstructive Sleep Apnea (327.23 [G47.33 ICD-10])  RECOMMENDATIONS - Trial of CPAP therapy on 21 cm H2O with a Small size Philips Respironics Full Face Mask Amara View mask and heated humidification. - Avoid alcohol,  sedatives and other CNS depressants that may worsen sleep apnea and disrupt normal sleep architecture. - Sleep hygiene should be reviewed to assess factors that may  improve sleep quality. - Weight management and regular exercise should be initiated or continued.  [Electronically signed] 09/23/2015 01:37 PM  Baird Lyons MD, [place], American Board of Sleep Medicine   NPI: [id number]  [place], American Board of Sleep Medicine  ELECTRONICALLY SIGNED ON:  09/23/2015, 1:33 PM [place] PH: [phone]   FX: [phone] [place]

## 2015-09-26 ENCOUNTER — Encounter: Payer: Self-pay | Admitting: Sports Medicine

## 2015-09-26 ENCOUNTER — Ambulatory Visit (INDEPENDENT_AMBULATORY_CARE_PROVIDER_SITE_OTHER): Payer: Commercial Managed Care - HMO | Admitting: Sports Medicine

## 2015-09-26 DIAGNOSIS — S63501A Unspecified sprain of right wrist, initial encounter: Secondary | ICD-10-CM | POA: Insufficient documentation

## 2015-09-26 DIAGNOSIS — S63501D Unspecified sprain of right wrist, subsequent encounter: Secondary | ICD-10-CM

## 2015-09-26 DIAGNOSIS — G4733 Obstructive sleep apnea (adult) (pediatric): Secondary | ICD-10-CM

## 2015-09-26 MED ORDER — AMBULATORY NON FORMULARY MEDICATION: 0 refills | Status: DC

## 2015-09-26 NOTE — Progress Notes (Signed)
  Subjective:    CC: Follow-up  HPI: Obstructive sleep apnea: Severe.  Right wrist sprain: Occurred a week ago, pain is improving but still present  Obesity: Is currently in the process of getting set up for weight loss surgery  Past medical history, Surgical history, Family history not pertinant except as noted below, Social history, Allergies, and medications have been entered into the medical record, reviewed, and no changes needed.   Review of Systems: No fevers, chills, night sweats, weight loss, chest pain, or shortness of breath.   Objective:    General: Well Developed, well nourished, and in no acute distress.  Neuro: Alert and oriented x3, extra-ocular muscles intact, sensation grossly intact.  HEENT: Normocephalic, atraumatic, pupils equal round reactive to light, neck supple, no masses, no lymphadenopathy, thyroid nonpalpable.  Skin: Warm and dry, no rashes. Cardiac: Regular rate and rhythm, no murmurs rubs or gallops, no lower extremity edema.  Respiratory: Clear to auscultation bilaterally. Not using accessory muscles, speaking in full sentences. Right Wrist: Inspection normal with no visible erythema or swelling. ROM smooth and normal with good flexion and extension and ulnar/radial deviation that is symmetrical with opposite wrist. Some tenderness over the distal radius Palpation is normal over metacarpals, navicular, lunate, and TFCC; tendons without tenderness/ swelling No snuffbox tenderness. No tenderness over Canal of Guyon. Strength 5/5 in all directions without pain. Negative Finkelstein, tinel's and phalens. Negative Watson's test.  X-rays are negative  Impression and Recommendations:    Obstructive sleep apnea Severe obstructive sleep apnea noted. There was a recommendation for ENT referral for consideration of a correctable anatomic defect. I will write her a prescription for CPAP at the recommended pressure.  Right wrist sprain Simple sprain, exam  is benign, x-rays negative. Continue wrist brace for now.  I spent 25 minutes with this patient, greater than 50% was face-to-face time counseling regarding the above diagnoses

## 2015-09-26 NOTE — Assessment & Plan Note (Signed)
Simple sprain, exam is benign, x-rays negative. Continue wrist brace for now.

## 2015-09-26 NOTE — Assessment & Plan Note (Addendum)
Severe obstructive sleep apnea noted. There was a recommendation for ENT referral for consideration of a correctable anatomic defect. I will write her a prescription for CPAP at the recommended pressure.

## 2015-09-29 ENCOUNTER — Other Ambulatory Visit: Payer: Self-pay

## 2015-09-29 DIAGNOSIS — G4733 Obstructive sleep apnea (adult) (pediatric): Secondary | ICD-10-CM

## 2015-09-29 MED ORDER — AMBULATORY NON FORMULARY MEDICATION: 0 refills | Status: DC

## 2015-10-16 ENCOUNTER — Ambulatory Visit (INDEPENDENT_AMBULATORY_CARE_PROVIDER_SITE_OTHER): Payer: Commercial Managed Care - HMO | Admitting: Sports Medicine

## 2015-10-16 DIAGNOSIS — S63501D Unspecified sprain of right wrist, subsequent encounter: Secondary | ICD-10-CM

## 2015-10-16 DIAGNOSIS — G894 Chronic pain syndrome: Secondary | ICD-10-CM | POA: Insufficient documentation

## 2015-10-16 DIAGNOSIS — G4733 Obstructive sleep apnea (adult) (pediatric): Secondary | ICD-10-CM

## 2015-10-16 MED ORDER — TRAMADOL HCL 50 MG PO TABS: ORAL_TABLET | ORAL | 0 refills | Status: DC

## 2015-10-16 NOTE — Assessment & Plan Note (Signed)
Patient needs a new CPAP machine.

## 2015-10-16 NOTE — Assessment & Plan Note (Signed)
Continue gabapentin, methocarbamol when necessary, switching from overdose of ibuprofen to tramadol 50 mg 3 times per day. Unable to use NSAIDS anymore or Tylenol.

## 2015-10-16 NOTE — Progress Notes (Signed)
  Subjective:    CC: Follow-up  HPI: Widespread pain: Has been consuming an overdose of ibuprofen, agreeable to try tramadol.  Obstructive sleep apnea: Needs a new CPAP.  Wrist sprain: Still painful but continues to improve.  Past medical history, Surgical history, Family history not pertinant except as noted below, Social history, Allergies, and medications have been entered into the medical record, reviewed, and no changes needed.   Review of Systems: No fevers, chills, night sweats, weight loss, chest pain, or shortness of breath.   Objective:    General: Well Developed, well nourished, and in no acute distress.  Neuro: Alert and oriented x3, extra-ocular muscles intact, sensation grossly intact.  HEENT: Normocephalic, atraumatic, pupils equal round reactive to light, neck supple, no masses, no lymphadenopathy, thyroid nonpalpable.  Skin: Warm and dry, no rashes. Cardiac: Regular rate and rhythm, no murmurs rubs or gallops, no lower extremity edema.  Respiratory: Clear to auscultation bilaterally. Not using accessory muscles, speaking in full sentences.  Impression and Recommendations:    Obstructive sleep apnea Patient needs a new CPAP machine.  Chronic pain syndrome Continue gabapentin, methocarbamol when necessary, switching from overdose of ibuprofen to tramadol 50 mg 3 times per day. Unable to use NSAIDS anymore or Tylenol.

## 2015-10-21 ENCOUNTER — Other Ambulatory Visit: Payer: Self-pay | Admitting: Emergency Medicine

## 2015-10-21 ENCOUNTER — Other Ambulatory Visit: Payer: Self-pay | Admitting: Sports Medicine

## 2015-10-21 DIAGNOSIS — M25531 Pain in right wrist: Secondary | ICD-10-CM

## 2015-10-26 ENCOUNTER — Ambulatory Visit
Admission: RE | Admit: 2015-10-26 | Discharge: 2015-10-26 | Disposition: A | Discharge status: Home / Self Care | Payer: Worker's Compensation | Source: Ambulatory Visit | Attending: Emergency Medicine | Admitting: Emergency Medicine

## 2015-10-26 DIAGNOSIS — M25531 Pain in right wrist: Secondary | ICD-10-CM

## 2015-11-10 ENCOUNTER — Ambulatory Visit (INDEPENDENT_AMBULATORY_CARE_PROVIDER_SITE_OTHER): Payer: Commercial Managed Care - HMO | Admitting: Sports Medicine

## 2015-11-10 ENCOUNTER — Encounter: Payer: Self-pay | Admitting: Sports Medicine

## 2015-11-10 DIAGNOSIS — G4733 Obstructive sleep apnea (adult) (pediatric): Secondary | ICD-10-CM

## 2015-11-10 DIAGNOSIS — R601 Generalized edema: Secondary | ICD-10-CM

## 2015-11-10 DIAGNOSIS — Z0000 Encounter for general adult medical examination without abnormal findings: Secondary | ICD-10-CM | POA: Diagnosis not present

## 2015-11-10 MED ORDER — FUROSEMIDE 40 MG PO TABS: 40.0000 mg | ORAL_TABLET | Freq: Every day | ORAL | 3 refills | Status: DC

## 2015-11-10 NOTE — Assessment & Plan Note (Signed)
Unclear etiology. Checking a CBC, CMP, TSH, urinalysis, BNP.  Adding Lasix 40 mg daily for a week. She has 2+ pitting edema in the lower extremity's and is 20-30 pounds up over the past couple of months.

## 2015-11-10 NOTE — Assessment & Plan Note (Signed)
Checking routine blood work, referral for cervical cancer screening and down stairs for mammogram.

## 2015-11-10 NOTE — Progress Notes (Signed)
  Subjective:    CC: Swelling  HPI:  Obstructive sleep apnea: Fantastic improvements in AHI with nasal CPAP.  Swelling: Has noted this after starting her CPAP, significant increases in weight and swelling in the legs and face. No orthopnea, PND, chest pain, or shortness of breath.  Getting set up for bariatric surgery, needs a complete physical.  Past medical history, Surgical history, Family history not pertinant except as noted below, Social history, Allergies, and medications have been entered into the medical record, reviewed, and no changes needed.   Review of Systems: No fevers, chills, night sweats, weight loss, chest pain, or shortness of breath.   Objective:    General: Well Developed, well nourished, and in no acute distress.  Neuro: Alert and oriented x3, extra-ocular muscles intact, sensation grossly intact.  HEENT: Normocephalic, atraumatic, pupils equal round reactive to light, neck supple, no masses, no lymphadenopathy, thyroid nonpalpable.  Skin: Warm and dry, no rashes. Cardiac: Regular rate and rhythm, no murmurs rubs or gallops, Bilateral 2+ symmetric pitting edema with a negative Homans sign.  Respiratory: Clear to auscultation bilaterally. Not using accessory muscles, speaking in full sentences.  Impression and Recommendations:    Obstructive sleep apnea Doing extremely well with CPAP.  Anasarca Unclear etiology. Checking a CBC, CMP, TSH, urinalysis, BNP.  Adding Lasix 40 mg daily for a week. She has 2+ pitting edema in the lower extremity's and is 20-30 pounds up over the past couple of months.  Annual physical exam Checking routine blood work, referral for cervical cancer screening and down stairs for mammogram.  I spent 25 minutes with this patient, greater than 50% was face-to-face time counseling regarding the above diagnoses

## 2015-11-10 NOTE — Assessment & Plan Note (Signed)
Doing extremely well with CPAP.

## 2015-11-20 ENCOUNTER — Ambulatory Visit (INDEPENDENT_AMBULATORY_CARE_PROVIDER_SITE_OTHER): Payer: Commercial Managed Care - HMO | Admitting: Sports Medicine

## 2015-11-20 DIAGNOSIS — R601 Generalized edema: Secondary | ICD-10-CM | POA: Diagnosis not present

## 2015-11-20 DIAGNOSIS — Z0000 Encounter for general adult medical examination without abnormal findings: Secondary | ICD-10-CM | POA: Diagnosis not present

## 2015-11-20 DIAGNOSIS — G894 Chronic pain syndrome: Secondary | ICD-10-CM

## 2015-11-20 LAB — CBC
HCT: 40.9 % (ref 35.0–45.0)
Hemoglobin: 13.5 g/dL (ref 11.7–15.5)
MCH: 29.7 pg (ref 27.0–33.0)
MCHC: 33 g/dL (ref 32.0–36.0)
MCV: 90.1 fL (ref 80.0–100.0)
MPV: 10.2 fL (ref 7.5–12.5)
Platelets: 278 10*3/uL (ref 140–400)
RBC: 4.54 MIL/uL (ref 3.80–5.10)
RDW: 14.5 % (ref 11.0–15.0)
WBC: 8.2 K/uL (ref 3.8–10.8)

## 2015-11-20 MED ORDER — TRAMADOL-ACETAMINOPHEN 37.5-325 MG PO TABS: 1.0000 | ORAL_TABLET | Freq: Three times a day (TID) | ORAL | 0 refills | Status: DC | PRN

## 2015-11-20 MED ORDER — FUROSEMIDE 40 MG PO TABS: 40.0000 mg | ORAL_TABLET | Freq: Every day | ORAL | 3 refills | Status: DC

## 2015-11-20 MED ORDER — PREGABALIN 75 MG PO CAPS: 75.0000 mg | ORAL_CAPSULE | Freq: Two times a day (BID) | ORAL | 3 refills | Status: DC

## 2015-11-20 MED ORDER — SPIRONOLACTONE 25 MG PO TABS: 25.0000 mg | ORAL_TABLET | Freq: Every day | ORAL | 11 refills | Status: DC

## 2015-11-20 NOTE — Assessment & Plan Note (Signed)
Discontinue tramadol and gabapentin, and affected.  Switching to Lyrica and Ultracet.

## 2015-11-20 NOTE — Assessment & Plan Note (Signed)
Continue furosemide, still with significant anasarca and lower extremity 2+ pitting edema. Adding spironolactone, we do need to check a BMP in 2 weeks.

## 2015-11-20 NOTE — Assessment & Plan Note (Signed)
Annual physical as above, awaiting blood work.

## 2015-11-20 NOTE — Progress Notes (Signed)
  Subjective:    CC: Physical exam  HPI:  This is a pleasant 55 year old female, here for her routine physical.  Chronic pain syndrome: No longer controlled with gabapentin and tramadol.  Anasarca: Just got her blood work done today. Had some improvement with only 2 days of Lasix.  Past medical history:  Negative.  See flowsheet/record as well for more information.  Surgical history: Negative.  See flowsheet/record as well for more information.  Family history: Negative.  See flowsheet/record as well for more information.  Social history: Negative.  See flowsheet/record as well for more information.  Allergies, and medications have been entered into the medical record, reviewed, and no changes needed.    Review of Systems: No headache, visual changes, nausea, vomiting, diarrhea, constipation, dizziness, abdominal pain, skin rash, fevers, chills, night sweats, swollen lymph nodes, weight loss, chest pain, body aches, joint swelling, muscle aches, shortness of breath, mood changes, visual or auditory hallucinations.  Objective:    General: Well Developed, well nourished, and in no acute distress.  Neuro: Alert and oriented x3, extra-ocular muscles intact, sensation grossly intact. Cranial nerves II through XII are intact, motor, sensory, and coordinative functions are all intact. HEENT: Normocephalic, atraumatic, pupils equal round reactive to light, neck supple, no masses, no lymphadenopathy, thyroid nonpalpable. Oropharynx, nasopharynx, external ear canals are unremarkable. Skin: Warm and dry, no rashes noted.  Cardiac: Regular rate and rhythm, no murmurs rubs or gallops. 2+ lower extremity pitting edema. Respiratory: Clear to auscultation bilaterally. Not using accessory muscles, speaking in full sentences.  Abdominal: Soft, nontender, nondistended, positive bowel sounds, no masses, no organomegaly.  Musculoskeletal: Shoulder, elbow, wrist, hip, knee, ankle stable, and with full range of  motion.  Impression and Recommendations:    The patient was counselled, risk factors were discussed, anticipatory guidance given.  Annual physical exam Annual physical as above, awaiting blood work.  Anasarca Continue furosemide, still with significant anasarca and lower extremity 2+ pitting edema. Adding spironolactone, we do need to check a BMP in 2 weeks.  Chronic pain syndrome Discontinue tramadol and gabapentin, and affected.  Switching to Lyrica and Ultracet.  I spent 25 minutes with this patient, greater than 50% was face-to-face time counseling regarding the above diagnoses this was separate from the time spent performing the physical exam.

## 2015-11-21 LAB — URINALYSIS
Bilirubin Urine: NEGATIVE
Glucose, UA: NEGATIVE
Hgb urine dipstick: NEGATIVE
Ketones, ur: NEGATIVE
Leukocytes, UA: NEGATIVE
Nitrite: NEGATIVE
Protein, ur: NEGATIVE
Specific Gravity, Urine: 1.025 (ref 1.001–1.035)
pH: 6 (ref 5.0–8.0)

## 2015-11-21 LAB — COMPREHENSIVE METABOLIC PANEL
ALT: 13 U/L (ref 6–29)
CO2: 17 mmol/L — ABNORMAL LOW (ref 20–31)
Calcium: 8.6 mg/dL (ref 8.6–10.4)
Chloride: 111 mmol/L — ABNORMAL HIGH (ref 98–110)
Glucose, Bld: 105 mg/dL — ABNORMAL HIGH (ref 65–99)
Potassium: 4.2 mmol/L (ref 3.5–5.3)
Sodium: 140 mmol/L (ref 135–146)
Total Protein: 6.3 g/dL (ref 6.1–8.1)

## 2015-11-21 LAB — HIV ANTIBODY (ROUTINE TESTING W REFLEX): HIV 1&2 Ab, 4th Generation: NONREACTIVE

## 2015-11-21 LAB — COMPREHENSIVE METABOLIC PANEL WITH GFR
AST: 15 U/L (ref 10–35)
Albumin: 3.8 g/dL (ref 3.6–5.1)
Alkaline Phosphatase: 50 U/L (ref 33–130)
BUN: 21 mg/dL (ref 7–25)
Creat: 0.62 mg/dL (ref 0.50–1.05)
Total Bilirubin: 0.4 mg/dL (ref 0.2–1.2)

## 2015-11-21 LAB — LIPID PANEL
Cholesterol: 168 mg/dL (ref 125–200)
HDL: 48 mg/dL (ref >=46)
LDL Cholesterol: 104 mg/dL (ref <130)
Total CHOL/HDL Ratio: 3.5 Ratio (ref <=5.0)
Triglycerides: 82 mg/dL (ref <150)
VLDL: 16 mg/dL (ref <30)

## 2015-11-21 LAB — BRAIN NATRIURETIC PEPTIDE: Brain Natriuretic Peptide: 12.2 pg/mL (ref <100)

## 2015-11-24 ENCOUNTER — Ambulatory Visit: Payer: Self-pay | Admitting: Sports Medicine

## 2015-11-26 ENCOUNTER — Emergency Department
Admission: EM | Admit: 2015-11-26 | Discharge: 2015-11-26 | Disposition: A | Discharge status: Home / Self Care | Payer: Commercial Managed Care - HMO | Source: Home / Self Care | Attending: Family Medicine | Admitting: Family Medicine

## 2015-11-26 DIAGNOSIS — R609 Edema, unspecified: Secondary | ICD-10-CM | POA: Diagnosis not present

## 2015-11-26 MED ORDER — TORSEMIDE 20 MG PO TABS: 20.0000 mg | ORAL_TABLET | Freq: Every day | ORAL | 0 refills | Status: DC

## 2015-11-26 NOTE — Discharge Instructions (Signed)
Stop Lasix for now (resume after finishing torsemide).  Minimize sodium in diet.  Weigh daily and record on calendar.

## 2015-11-26 NOTE — ED Provider Notes (Signed)
[name]    CSN: [id number] Arrival date & time: 11/26/15  1027     History   Chief Complaint Chief Complaint  Patient presents with  . Leg Swelling    HPI Sara Wolfe is a 55 y.o. female.   Patient has a history of lower leg dependent edema, having been started on spironolactone 1.5 weeks ago in addition to her daily Lasix.  About 4 days ago she had an increase in her usual low back pain and began taking tramadol prn.  During the past several days she has had an increase in fluid retention.  Two days ago she developed a mild sore throat and increased fatigue, but no cough.      Past Medical History:  Diagnosis Date  . GERD (gastroesophageal reflux disease)   . Hot flashes, menopausal   . Medical history non-contributory   . PONV (postoperative nausea and vomiting)     Patient Active Problem List   Diagnosis Date Noted  . Anasarca 11/10/2015  . Chronic pain syndrome 10/16/2015  . Hyperlipidemia 05/20/2015  . Migraine headache 05/16/2015  . Obstructive sleep apnea 05/16/2015  . Lateral epicondylitis of left elbow 05/09/2015  . Dyspepsia 05/09/2015  . Left lumbar radiculitis 04/11/2014  . Annual physical exam 02/21/2014  . Obesity 02/21/2014  . S/P cervical spinal fusion 10/04/2013  . Hot flash, menopausal 12/27/2011    Past Surgical History:  Procedure Laterality Date  . ANTERIOR CERVICAL DECOMP/DISCECTOMY FUSION N/A 10/04/2013   Procedure: ANTERIOR CERVICAL DECOMPRESSION/DISCECTOMY FUSION 3 LEVELS;  Surgeon: Sinclair Ship, MD;  Location: [facility];  Service: Orthopedics;  Laterality: N/A;  Anterior cervical decompression fusion cervical 4-5, cervical 5-6, cervical 6-7 with instrumentation and allograft  . ANTERIOR CERVICAL DECOMP/DISCECTOMY FUSION N/A 04/11/2014   Procedure: ANTERIOR CERVICAL DECOMPRESSION/DISCECTOMY FUSION 1 LEVEL;  Surgeon: Sinclair Ship, MD;  Location: [facility];  Service: Orthopedics;  Laterality: N/A;  Anterior cervical  decompression fusion, cervical 7-thoracic 1 with instrumentation and allograft  . FOOT SURGERY    . TUBAL LIGATION      OB History    No data available       Home Medications    Prior to Admission medications   Medication Sig Start Date End Date Taking? Authorizing Provider  AMBULATORY NON FORMULARY MEDICATION Continuous positive airway pressure (CPAP) machine set at 21 cm of H2O pressure, with all supplemental supplies as needed. 09/29/15   Silverio Decamp, MD  furosemide (LASIX) 40 MG tablet Take 1 tablet (40 mg total) by mouth daily. 11/20/15   Silverio Decamp, MD  meloxicam (MOBIC) 15 MG tablet One tab PO qAM with breakfast for 2 weeks, then daily prn pain. 09/11/15   Silverio Decamp, MD  pregabalin (LYRICA) 75 MG capsule Take 1 capsule (75 mg total) by mouth 2 (two) times daily. 11/20/15   Silverio Decamp, MD  rizatriptan (MAXALT-MLT) 10 MG disintegrating tablet Take 1 tablet (10 mg total) by mouth as needed for migraine. May repeat in 2 hours if needed 05/16/15   Silverio Decamp, MD  spironolactone (ALDACTONE) 25 MG tablet Take 1 tablet (25 mg total) by mouth daily. 11/20/15   Silverio Decamp, MD  topiramate (TOPAMAX) 50 MG tablet Take 1 tablet (50 mg total) by mouth daily. 07/07/15   Silverio Decamp, MD  torsemide (DEMADEX) 20 MG tablet Take 1 tablet (20 mg total) by mouth daily. 11/26/15   Kandra Nicolas, MD  traMADol-acetaminophen (ULTRACET) 37.5-325 MG tablet Take 1  tablet by mouth every 8 (eight) hours as needed for severe pain. 11/20/15   Silverio Decamp, MD  venlafaxine XR (EFFEXOR-XR) 150 MG 24 hr capsule Take 1 capsule (150 mg total) by mouth daily with breakfast. 05/16/15   Silverio Decamp, MD    Family History No family history on file.  Social History Social History  Substance Use Topics  . Smoking status: Former Smoker    Packs/day: 0.25    Years: 2.00    Types: Cigarettes    Quit date: 04/02/2012  . Smokeless tobacco:  Never Used  . Alcohol use No     Allergies   Review of patient's allergies indicates no known allergies.   Review of Systems Review of Systems + sore throat No cough No pleuritic pain No wheezing + nasal congestion + post-nasal drainage No sinus pain/pressure No itchy/red eyes No earache No hemoptysis No SOB No fever/chills No nausea No vomiting No abdominal pain No diarrhea No urinary symptoms No skin rash + fatigue ? myalgias + headache Used OTC meds without relief   Physical Exam Triage Vital Signs ED Triage Vitals  Enc Vitals Group     BP 11/26/15 1059 114/77     Pulse Rate 11/26/15 1059 65     Resp 11/26/15 1059 16     Temp 11/26/15 1059 98.6 F (37 C)     Temp Source 11/26/15 1059 Oral     SpO2 11/26/15 1059 97 %     Weight 11/26/15 1101 245 lb (111.1 kg)     Height 11/26/15 1101 5\' 1"  (1.549 m)     Head Circumference --      Peak Flow --      Pain Score 11/26/15 1101 0     Pain Loc --      Pain Edu? --      Excl. in [place]? --    No data found.   Updated Vital Signs BP 114/77 (BP Location: Left Arm)   Pulse 65   Temp 98.6 F (37 C) (Oral)   Resp 16   Ht 5\' 1"  (1.549 m)   Wt 245 lb (111.1 kg)   SpO2 97%   BMI 46.29 kg/m   Visual Acuity Right Eye Distance:   Left Eye Distance:   Bilateral Distance:    Right Eye Near:   Left Eye Near:    Bilateral Near:     Physical Exam Nursing notes and Vital Signs reviewed. Appearance:  Patient appears stated age, and in no acute distress Eyes:  Pupils are equal, round, and reactive to light and accomodation.  Extraocular movement is intact.  Conjunctivae are not inflamed  Ears:  Canals normal.  Tympanic membranes normal.  Nose:  Mildly congested turbinates.  No sinus tenderness.   Pharynx:  Normal Neck:  Supple.  Tender enlarged posterior/lateral nodes are palpated bilaterally  Lungs:  Clear to auscultation.  Breath sounds are equal.  Moving air well. Heart:  Regular rate and rhythm without  murmurs, rubs, or gallops.  Abdomen:  Nontender without masses or hepatosplenomegaly.  Bowel sounds are present.  No CVA or flank tenderness.  Extremities:  Trace lower leg edema.  Skin:  No rash present.    UC Treatments / Results  Labs (all labs ordered are listed, but only abnormal results are displayed) Labs Reviewed - No data to display  EKG  EKG Interpretation None       Radiology No results found.  Procedures Procedures (including critical care time)  Medications Ordered in UC Medications - No data to display   Initial Impression / Assessment and Plan / UC Course  I have reviewed the triage vital signs and the nursing notes.  Pertinent labs & imaging results that were available during my care of the patient were reviewed by me and considered in my medical decision making (see chart for details).  Clinical Course  Suspect early viral URI causing metabolic changes and increased fluid retention.  Begin Torsemide 20mg  for five days. Stop Lasix for now (resume after finishing torsemide).  Minimize sodium in diet.  Weigh daily and record on calendar. Followup with Family Doctor in one week.    Final Clinical Impressions(s) / UC Diagnoses   Final diagnoses:  Dependent edema  Peripheral edema    New Prescriptions New Prescriptions   TORSEMIDE (DEMADEX) 20 MG TABLET    Take 1 tablet (20 mg total) by mouth daily.     Kandra Nicolas, MD 11/29/15 2206

## 2015-11-26 NOTE — ED Triage Notes (Signed)
Pt c/o bilateral leg swelling x 2 mths. She has seen Dr T and was given diuretics without relief. She reports HA, increased leg swelling and generalized body swelling yesterday.

## 2015-11-27 ENCOUNTER — Ambulatory Visit: Payer: Self-pay | Admitting: Sports Medicine

## 2015-12-02 ENCOUNTER — Encounter: Payer: Self-pay | Admitting: Obstetrics & Gynecology

## 2015-12-02 ENCOUNTER — Ambulatory Visit (INDEPENDENT_AMBULATORY_CARE_PROVIDER_SITE_OTHER): Payer: Commercial Managed Care - HMO | Admitting: Obstetrics & Gynecology

## 2015-12-02 ENCOUNTER — Ambulatory Visit (INDEPENDENT_AMBULATORY_CARE_PROVIDER_SITE_OTHER): Payer: Commercial Managed Care - HMO

## 2015-12-02 VITALS — Ht 61.0 in | Wt 245.0 lb

## 2015-12-02 DIAGNOSIS — Z113 Encounter for screening for infections with a predominantly sexual mode of transmission: Secondary | ICD-10-CM | POA: Diagnosis not present

## 2015-12-02 DIAGNOSIS — Z1151 Encounter for screening for human papillomavirus (HPV): Secondary | ICD-10-CM

## 2015-12-02 DIAGNOSIS — Z01419 Encounter for gynecological examination (general) (routine) without abnormal findings: Secondary | ICD-10-CM

## 2015-12-02 DIAGNOSIS — Z1231 Encounter for screening mammogram for malignant neoplasm of breast: Secondary | ICD-10-CM | POA: Diagnosis not present

## 2015-12-02 DIAGNOSIS — Z124 Encounter for screening for malignant neoplasm of cervix: Secondary | ICD-10-CM

## 2015-12-02 MED ORDER — VITAMIN D (ERGOCALCIFEROL) 1.25 MG (50000 UNIT) PO CAPS: 50000.0000 [IU] | ORAL_CAPSULE | ORAL | 0 refills | Status: DC

## 2015-12-02 NOTE — Progress Notes (Signed)
Subjective:    Sara Wolfe is a 55 y.o. DW P2 (27 and 49 yo kids, 6 grands) female who presents for an annual exam. The patient has no complaints today. The patient is sexually active. GYN screening history: last pap: was normal. The patient wears seatbelts: yes. The patient participates in regular exercise: no. Has the patient ever been transfused or tattooed?: yes. The patient reports that there is not domestic violence in her life.   Menstrual History: OB History    Gravida Para Term Preterm AB Living   2 2 2     2    SAB TAB Ectopic Multiple Live Births                  Menarche age: 22 No LMP recorded. Patient is postmenopausal.    The following portions of the patient's history were reviewed and updated as appropriate: allergies, current medications, past family history, past medical history, past social history, past surgical history and problem list.  Review of Systems Pertinent items are noted in HPI. Monogamous for 10 months, no dyspareunia. Menopausal for 6 years. Works at [employer] a "mini bypass" for weight loss in January. FH- maunt with breast cancer, sister with colon cancer, died at 52 yo. Patient's colonoscopy was normal last month. Mammogram done today No gyn cancer   Objective:    Ht 5\' 1"  (1.549 m)   Wt 245 lb (111.1 kg)   BMI 46.29 kg/m   General Appearance:    Alert, cooperative, no distress, appears stated age  Head:    Normocephalic, without obvious abnormality, atraumatic  Eyes:    PERRL, conjunctiva/corneas clear, EOM's intact, fundi    benign, both eyes  Ears:    Normal TM's and external ear canals, both ears  Nose:   Nares normal, septum midline, mucosa normal, no drainage    or sinus tenderness  Throat:   Lips, mucosa, and tongue normal; teeth and gums normal  Neck:   Supple, symmetrical, trachea midline, no adenopathy;    thyroid:  no enlargement/tenderness/nodules; no carotid   bruit or JVD  Back:     Symmetric, no curvature,  ROM normal, no CVA tenderness  Lungs:     Clear to auscultation bilaterally, respirations unlabored  Chest Wall:    No tenderness or deformity   Heart:    Regular rate and rhythm, S1 and S2 normal, no murmur, rub   or gallop  Breast Exam:    No tenderness, masses, or nipple abnormality  Abdomen:     Soft, non-tender, bowel sounds active all four quadrants,    no masses, no organomegaly  Genitalia:    Normal female without lesion, discharge or tenderness, NSSA, NT, mobile, no palpable adnexal masses     Extremities:   Extremities normal, atraumatic, no cyanosis or edema  Pulses:   2+ and symmetric all extremities  Skin:   Skin color, texture, turgor normal, no rashes or lesions  Lymph nodes:   Cervical, supraclavicular, and axillary nodes normal  Neurologic:   CNII-XII intact, normal strength, sensation and reflexes    throughout   .    Assessment:    Healthy female exam.   Low Vitamin D   Plan:     Thin prep Pap smear. with cotesting STI testing Vit D 50,000 weekly

## 2015-12-03 LAB — BASIC METABOLIC PANEL WITH GFR
BUN: 15 mg/dL (ref 7–25)
Calcium: 9.1 mg/dL (ref 8.6–10.4)
Glucose, Bld: 86 mg/dL (ref 65–99)
Sodium: 140 mmol/L (ref 135–146)

## 2015-12-03 LAB — BASIC METABOLIC PANEL
CO2: 25 mmol/L (ref 20–31)
Chloride: 108 mmol/L (ref 98–110)
Creat: 0.78 mg/dL (ref 0.50–1.05)
Potassium: 4.4 mmol/L (ref 3.5–5.3)

## 2015-12-03 LAB — HEPATITIS B SURFACE ANTIGEN: HEP B S AG: NEGATIVE

## 2015-12-03 LAB — RPR

## 2015-12-04 LAB — CYTOLOGY - PAP

## 2015-12-16 ENCOUNTER — Other Ambulatory Visit: Payer: Self-pay

## 2015-12-16 DIAGNOSIS — G894 Chronic pain syndrome: Secondary | ICD-10-CM

## 2015-12-16 MED ORDER — TRAMADOL-ACETAMINOPHEN 37.5-325 MG PO TABS: 1.0000 | ORAL_TABLET | Freq: Three times a day (TID) | ORAL | 0 refills | Status: DC | PRN

## 2015-12-19 ENCOUNTER — Telehealth: Payer: Self-pay | Admitting: Sports Medicine

## 2015-12-19 NOTE — Telephone Encounter (Signed)
Patient called this A.M. And states that she called a couple of days ago and left a message for the nurse asking if she could pick up a Rx. Of Ultracet and did not get a call back. I did not see it left at the front desk or see a phone note about this request. Please call patient today and let her know status. Thanks, [employer]

## 2015-12-19 NOTE — Telephone Encounter (Signed)
Done

## 2015-12-25 ENCOUNTER — Ambulatory Visit (INDEPENDENT_AMBULATORY_CARE_PROVIDER_SITE_OTHER): Payer: Commercial Managed Care - HMO | Admitting: Sports Medicine

## 2015-12-25 ENCOUNTER — Encounter: Payer: Self-pay | Admitting: Sports Medicine

## 2015-12-25 DIAGNOSIS — R601 Generalized edema: Secondary | ICD-10-CM

## 2015-12-25 DIAGNOSIS — G894 Chronic pain syndrome: Secondary | ICD-10-CM

## 2015-12-25 MED ORDER — PREGABALIN 100 MG PO CAPS: 100.0000 mg | ORAL_CAPSULE | Freq: Two times a day (BID) | ORAL | 0 refills | Status: DC

## 2015-12-25 NOTE — Assessment & Plan Note (Signed)
Improved significantly with furosemide and Aldactone.

## 2015-12-25 NOTE — Progress Notes (Signed)
  Subjective:    CC: Follow-up  HPI: This is a pleasant 55 year old female, she has anasarca, without evidence of nephrotic syndrome, liver failure, or heart failure. We started her on Aldactone and furosemide and all symptoms have resolved.  Obesity: Is going through the process for bariatric surgery.  Upper respiratory infection: Mild cough, sore throat, otherwise low-grade fevers, constitutional symptoms but no shortness of breath, chest pain, nausea, vomiting, diarrhea. No rash.  Chronic pain syndrome: Improved drastically with switching from gabapentin to Lyrica and switching to Ultracet. Does want a slightly higher dose of Lyrica.  Past medical history:  Negative.  See flowsheet/record as well for more information.  Surgical history: Negative.  See flowsheet/record as well for more information.  Family history: Negative.  See flowsheet/record as well for more information.  Social history: Negative.  See flowsheet/record as well for more information.  Allergies, and medications have been entered into the medical record, reviewed, and no changes needed.   Review of Systems: No fevers, chills, night sweats, weight loss, chest pain, or shortness of breath.   Objective:    General: Well Developed, well nourished, and in no acute distress.  Neuro: Alert and oriented x3, extra-ocular muscles intact, sensation grossly intact.  HEENT: Normocephalic, atraumatic, pupils equal round reactive to light, neck supple, no masses, no lymphadenopathy, thyroid nonpalpable.  Skin: Warm and dry, no rashes. Cardiac: Regular rate and rhythm, no murmurs rubs or gallops, no lower extremity edema.  Respiratory: Clear to auscultation bilaterally. Not using accessory muscles, speaking in full sentences.  Impression and Recommendations:    Chronic pain syndrome Doing well with Ultracet, increasing Lyrica, did extremely well with the initial dose.  Anasarca Improved significantly with furosemide and  Aldactone.

## 2015-12-25 NOTE — Assessment & Plan Note (Signed)
Doing well with Ultracet, increasing Lyrica, did extremely well with the initial dose.

## 2016-01-07 ENCOUNTER — Emergency Department (INDEPENDENT_AMBULATORY_CARE_PROVIDER_SITE_OTHER): Payer: Commercial Managed Care - HMO

## 2016-01-07 ENCOUNTER — Emergency Department
Admission: EM | Admit: 2016-01-07 | Discharge: 2016-01-07 | Disposition: A | Discharge status: Home / Self Care | Payer: Commercial Managed Care - HMO | Source: Home / Self Care | Attending: Family Medicine | Admitting: Family Medicine

## 2016-01-07 ENCOUNTER — Encounter: Payer: Self-pay | Admitting: *Deleted

## 2016-01-07 DIAGNOSIS — J40 Bronchitis, not specified as acute or chronic: Secondary | ICD-10-CM | POA: Diagnosis not present

## 2016-01-07 DIAGNOSIS — R05 Cough: Secondary | ICD-10-CM | POA: Diagnosis not present

## 2016-01-07 MED ORDER — BENZONATATE 200 MG PO CAPS: 200.0000 mg | ORAL_CAPSULE | Freq: Every day | ORAL | 0 refills | Status: DC

## 2016-01-07 MED ORDER — AZITHROMYCIN 250 MG PO TABS: ORAL_TABLET | ORAL | 0 refills | Status: DC

## 2016-01-07 MED ORDER — ONDANSETRON 4 MG PO TBDP: ORAL_TABLET | ORAL | 0 refills | Status: DC

## 2016-01-07 NOTE — Discharge Instructions (Signed)
Take plain guaifenesin (1200mg extended release tabs such as Mucinex) twice daily, with plenty of water, for cough and congestion.  Get adequate rest.    °Stop all antihistamines for now, and other non-prescription cough/cold preparations. °  °Follow-up with family doctor if not improving about10 days.  °

## 2016-01-07 NOTE — ED Provider Notes (Signed)
[name]    CSN: [id number] Arrival date & time: 01/07/16  1303     History   Chief Complaint Chief Complaint  Patient presents with  . Cough    HPI Sara Wolfe is a 55 y.o. female.   3 weeks ago patient developed typical cold-like symptoms developing over several days, including mild sore throat, chills, headache, fatigue, and cough, but no nasal congestion.  During the past 2 to 3 days she has felt somewhat better, but the cough increased last night.  Her cough is non-productive, and worse at night.  She often coughs until she gags.  She has had increased wheezing.  No pleuritic pain.   The history is provided by the patient.    Past Medical History:  Diagnosis Date  . GERD (gastroesophageal reflux disease)   . Hot flashes, menopausal   . Medical history non-contributory   . PONV (postoperative nausea and vomiting)     Patient Active Problem List   Diagnosis Date Noted  . Anasarca 11/10/2015  . Chronic pain syndrome 10/16/2015  . Hyperlipidemia 05/20/2015  . Migraine headache 05/16/2015  . Obstructive sleep apnea 05/16/2015  . Lateral epicondylitis of left elbow 05/09/2015  . Dyspepsia 05/09/2015  . Left lumbar radiculitis 04/11/2014  . Annual physical exam 02/21/2014  . Obesity 02/21/2014  . S/P cervical spinal fusion 10/04/2013  . Hot flash, menopausal 12/27/2011    Past Surgical History:  Procedure Laterality Date  . ANTERIOR CERVICAL DECOMP/DISCECTOMY FUSION N/A 10/04/2013   Procedure: ANTERIOR CERVICAL DECOMPRESSION/DISCECTOMY FUSION 3 LEVELS;  Surgeon: Sinclair Ship, MD;  Location: [facility];  Service: Orthopedics;  Laterality: N/A;  Anterior cervical decompression fusion cervical 4-5, cervical 5-6, cervical 6-7 with instrumentation and allograft  . ANTERIOR CERVICAL DECOMP/DISCECTOMY FUSION N/A 04/11/2014   Procedure: ANTERIOR CERVICAL DECOMPRESSION/DISCECTOMY FUSION 1 LEVEL;  Surgeon: Sinclair Ship, MD;  Location: [facility];   Service: Orthopedics;  Laterality: N/A;  Anterior cervical decompression fusion, cervical 7-thoracic 1 with instrumentation and allograft  . FOOT SURGERY    . TUBAL LIGATION      OB History    Gravida Para Term Preterm AB Living   2 2 2     2    SAB TAB Ectopic Multiple Live Births                   Home Medications    Prior to Admission medications   Medication Sig Start Date End Date Taking? Authorizing Provider  AMBULATORY NON FORMULARY MEDICATION Continuous positive airway pressure (CPAP) machine set at 21 cm of H2O pressure, with all supplemental supplies as needed. 09/29/15   Silverio Decamp, MD  azithromycin (ZITHROMAX Z-PAK) 250 MG tablet Take 2 tabs today; then begin one tab once daily for 4 more days. 01/07/16   Kandra Nicolas, MD  benzonatate (TESSALON) 200 MG capsule Take 1 capsule (200 mg total) by mouth at bedtime. Take as needed for cough 01/07/16   Kandra Nicolas, MD  furosemide (LASIX) 40 MG tablet Take 1 tablet (40 mg total) by mouth daily. 11/20/15   Silverio Decamp, MD  meloxicam (MOBIC) 15 MG tablet One tab PO qAM with breakfast for 2 weeks, then daily prn pain. 09/11/15   Silverio Decamp, MD  pregabalin (LYRICA) 100 MG capsule Take 1 capsule (100 mg total) by mouth 2 (two) times daily. 12/25/15   Silverio Decamp, MD  rizatriptan (MAXALT-MLT) 10 MG disintegrating tablet Take 1 tablet (10 mg total) by mouth  as needed for migraine. May repeat in 2 hours if needed 05/16/15   Silverio Decamp, MD  spironolactone (ALDACTONE) 25 MG tablet Take 1 tablet (25 mg total) by mouth daily. 11/20/15   Silverio Decamp, MD  topiramate (TOPAMAX) 50 MG tablet Take 1 tablet (50 mg total) by mouth daily. 07/07/15   Silverio Decamp, MD  torsemide (DEMADEX) 20 MG tablet Take 1 tablet (20 mg total) by mouth daily. 11/26/15   Kandra Nicolas, MD  traMADol-acetaminophen (ULTRACET) 37.5-325 MG tablet Take 1 tablet by mouth every 8 (eight) hours as needed for  severe pain. 12/16/15   Silverio Decamp, MD  venlafaxine XR (EFFEXOR-XR) 150 MG 24 hr capsule Take 1 capsule (150 mg total) by mouth daily with breakfast. 05/16/15   Silverio Decamp, MD  Vitamin D, Ergocalciferol, (DRISDOL) 50000 units CAPS capsule Take 1 capsule (50,000 Units total) by mouth every 7 (seven) days. 12/02/15   Emily Filbert, MD    Family History History reviewed. No pertinent family history.  Social History Social History  Substance Use Topics  . Smoking status: Light Tobacco Smoker    Packs/day: 0.25    Years: 2.00    Types: Cigarettes    Last attempt to quit: 04/02/2012  . Smokeless tobacco: Never Used  . Alcohol use No     Allergies   Augmentin [amoxicillin-pot clavulanate]   Review of Systems Review of Systems  + sore throat + cough No pleuritic pain No wheezing + sneezing No nasal congestion ? post-nasal drainage No sinus pain/pressure No itchy/red eyes No earache No hemoptysis No SOB No fever, + chills No nausea No vomiting No abdominal pain No diarrhea No urinary symptoms No skin rash + fatigue No myalgias + headache Used OTC meds without relief    Physical Exam Triage Vital Signs ED Triage Vitals  Enc Vitals Group     BP 01/07/16 1327 107/71     Pulse Rate 01/07/16 1327 78     Resp 01/07/16 1327 16     Temp 01/07/16 1327 98.4 F (36.9 C)     Temp Source 01/07/16 1327 Oral     SpO2 01/07/16 1327 97 %     Weight 01/07/16 1327 251 lb (113.9 kg)     Height 01/07/16 1327 5\' 2"  (1.575 m)     Head Circumference --      Peak Flow --      Pain Score 01/07/16 1328 0     Pain Loc --      Pain Edu? --      Excl. in [place]? --    No data found.   Updated Vital Signs BP 107/71 (BP Location: Left Arm)   Pulse 78   Temp 98.4 F (36.9 C) (Oral)   Resp 16   Ht 5\' 2"  (1.575 m)   Wt 251 lb (113.9 kg)   SpO2 97%   BMI 45.91 kg/m   Visual Acuity Right Eye Distance:   Left Eye Distance:   Bilateral Distance:    Right Eye  Near:   Left Eye Near:    Bilateral Near:     Physical Exam Nursing notes and Vital Signs reviewed. Appearance:  Patient appears stated age, and in no acute distress Eyes:  Pupils are equal, round, and reactive to light and accomodation.  Extraocular movement is intact.  Conjunctivae are not inflamed  Ears:  Canals normal.  Tympanic membranes normal.  Nose:  Mildly congested turbinates.  No sinus tenderness.  Pharynx:  Normal Neck:  Supple.  Tender enlarged posterior/lateral nodes are palpated bilaterally  Lungs:  Clear to auscultation.  Breath sounds are equal.  Moving air well. Heart:  Regular rate and rhythm without murmurs, rubs, or gallops.  Abdomen:  Nontender without masses or hepatosplenomegaly.  Bowel sounds are present.  No CVA or flank tenderness.  Extremities:  No edema.  Skin:  No rash present.    UC Treatments / Results  Labs (all labs ordered are listed, but only abnormal results are displayed) Labs Reviewed - No data to display  EKG  EKG Interpretation None       Radiology Dg Chest 2 View  Result Date: 01/07/2016 CLINICAL DATA:  Cough for 3 weeks EXAM: CHEST  2 VIEW COMPARISON:  None. FINDINGS: The heart size and mediastinal contours are within normal limits. Both lungs are clear. The visualized skeletal structures are unremarkable. IMPRESSION: No active cardiopulmonary disease. Electronically Signed   By: Kathreen Devoid   On: 01/07/2016 14:20    Procedures Procedures (including critical care time)  Medications Ordered in UC Medications - No data to display   Initial Impression / Assessment and Plan / UC Course  I have reviewed the triage vital signs and the nursing notes.  Pertinent labs & imaging results that were available during my care of the patient were reviewed by me and considered in my medical decision making (see chart for details).  Clinical Course    Begin Z-pak for atypical coverage.  Prescription written for Benzonatate [facility]) to  take at bedtime for night-time cough.  Take plain guaifenesin (1200mg  extended release tabs such as Mucinex) twice daily, with plenty of water, for cough and congestion. Get adequate rest.   Stop all antihistamines for now, and other non-prescription cough/cold preparations.   Follow-up with family doctor if not improving about10 days.      Final Clinical Impressions(s) / UC Diagnoses   Final diagnoses:  Bronchitis    New Prescriptions New Prescriptions   AZITHROMYCIN (ZITHROMAX Z-PAK) 250 MG TABLET    Take 2 tabs today; then begin one tab once daily for 4 more days.   BENZONATATE (TESSALON) 200 MG CAPSULE    Take 1 capsule (200 mg total) by mouth at bedtime. Take as needed for cough     Kandra Nicolas, MD 01/10/16 [phone]

## 2016-01-07 NOTE — ED Triage Notes (Signed)
Pt c/o nonproductive cough, runny nose and sneezing x 3 wks. Denies fever. No OTC meds.

## 2016-01-20 ENCOUNTER — Other Ambulatory Visit: Payer: Self-pay

## 2016-01-20 DIAGNOSIS — G894 Chronic pain syndrome: Secondary | ICD-10-CM

## 2016-01-20 MED ORDER — TRAMADOL-ACETAMINOPHEN 37.5-325 MG PO TABS: 1.0000 | ORAL_TABLET | Freq: Three times a day (TID) | ORAL | 0 refills | Status: DC | PRN

## 2016-02-03 ENCOUNTER — Telehealth: Payer: Self-pay

## 2016-02-03 DIAGNOSIS — G894 Chronic pain syndrome: Secondary | ICD-10-CM

## 2016-02-03 MED ORDER — TRAMADOL-ACETAMINOPHEN 37.5-325 MG PO TABS: 1.0000 | ORAL_TABLET | Freq: Three times a day (TID) | ORAL | 0 refills | Status: DC | PRN

## 2016-02-03 MED ORDER — PREGABALIN 100 MG PO CAPS: 100.0000 mg | ORAL_CAPSULE | Freq: Two times a day (BID) | ORAL | 0 refills | Status: DC

## 2016-02-03 NOTE — Telephone Encounter (Signed)
Rx in box. 

## 2016-02-03 NOTE — Telephone Encounter (Signed)
Pt left VM asking if she can increase her dose of Lyrica. Also would like a refill of her Ultracet. Please advise.

## 2016-02-04 ENCOUNTER — Other Ambulatory Visit: Payer: Self-pay | Admitting: Sports Medicine

## 2016-02-04 DIAGNOSIS — G43809 Other migraine, not intractable, without status migrainosus: Secondary | ICD-10-CM

## 2016-02-14 ENCOUNTER — Other Ambulatory Visit: Payer: Self-pay | Admitting: Obstetrics & Gynecology

## 2016-02-21 ENCOUNTER — Other Ambulatory Visit: Payer: Self-pay | Admitting: Sports Medicine

## 2016-02-21 DIAGNOSIS — G43809 Other migraine, not intractable, without status migrainosus: Secondary | ICD-10-CM

## 2016-02-23 DIAGNOSIS — K439 Ventral hernia without obstruction or gangrene: Secondary | ICD-10-CM

## 2016-02-23 HISTORY — DX: Ventral hernia without obstruction or gangrene: K43.9

## 2016-02-27 ENCOUNTER — Other Ambulatory Visit: Payer: Self-pay | Admitting: Sports Medicine

## 2016-03-04 DIAGNOSIS — K449 Diaphragmatic hernia without obstruction or gangrene: Secondary | ICD-10-CM | POA: Insufficient documentation

## 2016-03-08 ENCOUNTER — Other Ambulatory Visit: Payer: Self-pay | Admitting: Sports Medicine

## 2016-03-08 DIAGNOSIS — G894 Chronic pain syndrome: Secondary | ICD-10-CM

## 2016-03-12 ENCOUNTER — Other Ambulatory Visit: Payer: Self-pay

## 2016-03-12 DIAGNOSIS — G894 Chronic pain syndrome: Secondary | ICD-10-CM

## 2016-03-12 MED ORDER — TRAMADOL-ACETAMINOPHEN 37.5-325 MG PO TABS: 1.0000 | ORAL_TABLET | Freq: Three times a day (TID) | ORAL | 0 refills | Status: DC | PRN

## 2016-04-09 ENCOUNTER — Other Ambulatory Visit: Payer: Self-pay | Admitting: Sports Medicine

## 2016-04-09 DIAGNOSIS — G894 Chronic pain syndrome: Secondary | ICD-10-CM

## 2016-04-16 ENCOUNTER — Emergency Department
Admission: EM | Admit: 2016-04-16 | Discharge: 2016-04-16 | Disposition: A | Discharge status: Home / Self Care | Payer: Commercial Managed Care - HMO | Source: Home / Self Care | Attending: Family Medicine | Admitting: Family Medicine

## 2016-04-16 DIAGNOSIS — R69 Illness, unspecified: Secondary | ICD-10-CM | POA: Diagnosis not present

## 2016-04-16 DIAGNOSIS — J111 Influenza due to unidentified influenza virus with other respiratory manifestations: Secondary | ICD-10-CM

## 2016-04-16 DIAGNOSIS — R110 Nausea: Secondary | ICD-10-CM

## 2016-04-16 MED ORDER — OSELTAMIVIR PHOSPHATE 75 MG PO CAPS: 75.0000 mg | ORAL_CAPSULE | Freq: Two times a day (BID) | ORAL | 0 refills | Status: DC

## 2016-04-16 NOTE — Discharge Instructions (Signed)
°  You may take 400-600mg Ibuprofen (Motrin) every 6-8 hours for fever and pain  °Alternate with Tylenol  °You may take 500mg Tylenol every 4-6 hours as needed for fever and pain  °Follow-up with your primary care provider next week for recheck of symptoms if not improving.  °Be sure to drink plenty of fluids and rest, at least 8hrs of sleep a night, preferably more while you are sick. °Return urgent care or go to closest ER if you cannot keep down fluids/signs of dehydration, fever not reducing with Tylenol, difficulty breathing/wheezing, stiff neck, worsening condition, or other concerns (see below)  ° °Oseltamivir (Tamiflu) is an antiviral medication that can help decrease symptoms of the flu by about 1 days and lessen severity of symptoms.  It is best to start this medication within first 48 hours of symptoms, however, some studies have shown relief even after the 48 hour time frame.  This medication may cause stomach upset including nausea, vomiting and diarrhea.  It may also cause dizziness or hallucinations in children.  To help prevent stomach upset, you may take this medication with food.  If you are still having unwanted symptoms, you may stop taking this medication as it is not as important to finish the entire course like antibiotics.  If you have questions/concerns please call our office or follow up with your primary care provider.   ° °

## 2016-04-16 NOTE — ED Provider Notes (Signed)
CSN: [id number]     Arrival date & time 04/16/16  13 History   First MD Initiated Contact with Patient 04/16/16 1527     Chief Complaint  Patient presents with  . Generalized Body Aches  . Fever  . Headache   (Consider location/radiation/quality/duration/timing/severity/associated sxs/prior Treatment) HPI  Sara Wolfe is a 56 y.o. female presenting to UC with c/o sudden onset nausea, body aches, fatigue, and temp of 99*F yesterday.  "all of it" is most bothersome for the pt. She reports hx of gastric bypass 1 month ago so she is unable to vomit. She did call her surgeon yesterday but they did not believe current symptoms were related to her recent surgery.  They did not recommend closer f/u or labs.  Denies diarrhea. She has been able to eat and drink some today.  Other at work have been sick. She did get the flu vaccine this year. Denies cough or congestion.    Past Medical History:  Diagnosis Date  . GERD (gastroesophageal reflux disease)   . Hot flashes, menopausal   . Medical history non-contributory   . PONV (postoperative nausea and vomiting)    Past Surgical History:  Procedure Laterality Date  . ANTERIOR CERVICAL DECOMP/DISCECTOMY FUSION N/A 10/04/2013   Procedure: ANTERIOR CERVICAL DECOMPRESSION/DISCECTOMY FUSION 3 LEVELS;  Surgeon: Sinclair Ship, MD;  Location: [facility];  Service: Orthopedics;  Laterality: N/A;  Anterior cervical decompression fusion cervical 4-5, cervical 5-6, cervical 6-7 with instrumentation and allograft  . ANTERIOR CERVICAL DECOMP/DISCECTOMY FUSION N/A 04/11/2014   Procedure: ANTERIOR CERVICAL DECOMPRESSION/DISCECTOMY FUSION 1 LEVEL;  Surgeon: Sinclair Ship, MD;  Location: [facility];  Service: Orthopedics;  Laterality: N/A;  Anterior cervical decompression fusion, cervical 7-thoracic 1 with instrumentation and allograft  . FOOT SURGERY    . GASTRIC BYPASS    . TUBAL LIGATION     History reviewed. No pertinent family history. Social History   Substance Use Topics  . Smoking status: Light Tobacco Smoker    Packs/day: 0.25    Years: 2.00    Types: Cigarettes    Last attempt to quit: 04/02/2012  . Smokeless tobacco: Never Used  . Alcohol use No   OB History    Gravida Para Term Preterm AB Living   2 2 2     2    SAB TAB Ectopic Multiple Live Births                 Review of Systems  Constitutional: Positive for fatigue and fever. Negative for chills.  HENT: Negative for congestion, ear pain, sore throat, trouble swallowing and voice change.   Respiratory: Negative for cough and shortness of breath.   Cardiovascular: Negative for chest pain and palpitations.  Gastrointestinal: Positive for nausea. Negative for abdominal pain, diarrhea and vomiting.  Musculoskeletal: Positive for arthralgias, back pain and myalgias.  Skin: Negative for rash.  Neurological: Negative for dizziness, light-headedness and headaches.    Allergies  Augmentin [amoxicillin-pot clavulanate]  Home Medications   Prior to Admission medications   Medication Sig Start Date End Date Taking? Authorizing Provider  AMBULATORY NON FORMULARY MEDICATION Continuous positive airway pressure (CPAP) machine set at 21 cm of H2O pressure, with all supplemental supplies as needed. 09/29/15   Silverio Decamp, MD  azithromycin (ZITHROMAX Z-PAK) 250 MG tablet Take 2 tabs today; then begin one tab once daily for 4 more days. 01/07/16   Kandra Nicolas, MD  benzonatate (TESSALON) 200 MG capsule Take 1 capsule (200 mg total) by  mouth at bedtime. Take as needed for cough 01/07/16   Kandra Nicolas, MD  furosemide (LASIX) 40 MG tablet Take 1 tablet (40 mg total) by mouth daily. 11/20/15   Silverio Decamp, MD  LYRICA 100 MG capsule TAKE ONE CAPSULE TWICE A DAY 04/09/16   Silverio Decamp, MD  meloxicam (MOBIC) 15 MG tablet One tab PO qAM with breakfast for 2 weeks, then daily prn pain. 09/11/15   Silverio Decamp, MD  oseltamivir (TAMIFLU) 75 MG capsule Take  1 capsule (75 mg total) by mouth every 12 (twelve) hours. 04/16/16   Noland Fordyce, PA-C  rizatriptan (MAXALT-MLT) 10 MG disintegrating tablet TAKE 1 TABLET (10 MG TOTAL) BY MOUTH AS NEEDED FOR MIGRAINE. MAY REPEAT IN 2 HOURS IF NEEDED 02/21/16   Silverio Decamp, MD  spironolactone (ALDACTONE) 25 MG tablet Take 1 tablet (25 mg total) by mouth daily. 11/20/15   Silverio Decamp, MD  topiramate (TOPAMAX) 50 MG tablet TAKE 1 TABLET (50 MG TOTAL) BY MOUTH DAILY. 02/04/16   Silverio Decamp, MD  torsemide (DEMADEX) 20 MG tablet Take 1 tablet (20 mg total) by mouth daily. 11/26/15   Kandra Nicolas, MD  traMADol-acetaminophen (ULTRACET) 37.5-325 MG tablet Take 1 tablet by mouth every 8 (eight) hours as needed for severe pain. 03/12/16   Silverio Decamp, MD  venlafaxine XR (EFFEXOR-XR) 150 MG 24 hr capsule Take 1 capsule (150 mg total) by mouth daily with breakfast. 05/16/15   Silverio Decamp, MD  Vitamin D, Ergocalciferol, (DRISDOL) 50000 units CAPS capsule TAKE 1 CAPSULE (50,000 UNITS TOTAL) BY MOUTH EVERY 7 (SEVEN) DAYS. 02/26/16   Emily Filbert, MD   Meds Ordered and Administered this Visit  Medications - No data to display  BP 111/77 (BP Location: Left Arm)   Pulse 73   Temp 98.4 F (36.9 C) (Oral)   Ht 5\' 2"  (1.575 m)   Wt 219 lb (99.3 kg)   SpO2 98%   BMI 40.06 kg/m  No data found.   Physical Exam  Constitutional: She is oriented to person, place, and time. She appears well-developed and well-nourished. No distress.  HENT:  Head: Normocephalic and atraumatic.  Right Ear: Tympanic membrane normal.  Left Ear: Tympanic membrane normal.  Nose: Nose normal.  Mouth/Throat: Uvula is midline, oropharynx is clear and moist and mucous membranes are normal.  Eyes: EOM are normal.  Neck: Normal range of motion. Neck supple.  Cardiovascular: Normal rate and regular rhythm.   Pulmonary/Chest: Effort normal and breath sounds normal. No stridor. No respiratory distress. She has  no wheezes. She has no rales.  Abdominal: Soft. She exhibits no distension and no mass. There is no tenderness. There is no rebound and no guarding.  Musculoskeletal: Normal range of motion.  Lymphadenopathy:    She has no cervical adenopathy.  Neurological: She is alert and oriented to person, place, and time.  Skin: Skin is warm and dry. She is not diaphoretic.  Psychiatric: She has a normal mood and affect. Her behavior is normal.  Nursing note and vitals reviewed.   Urgent Care Course     Procedures (including critical care time)  Labs Review Labs Reviewed - No data to display  Imaging Review No results found.    MDM   1. Influenza-like illness   2. Nausea    Symptoms sound c/w viral illness possible influenza vs gastroenteritis.  Will hold off on flu test due to possibility of false negative. Pt would like  to try empiric treatment for influenza. She has phenergan at home as well as another antinausea medication from recent surgery that she has not tried yet as she just now recalls she has some.  Encouraged fluids and rest. F/u with PCP in 1 week if not improving, sooner if worsening.    Noland Fordyce, PA-C 04/16/16 1624

## 2016-04-16 NOTE — ED Triage Notes (Signed)
Started yesterday morning with nausea, fatigue.  Had a fever last evening.  Has muscle aches and nausea today.

## 2016-05-13 ENCOUNTER — Other Ambulatory Visit: Payer: Self-pay | Admitting: Sports Medicine

## 2016-05-13 DIAGNOSIS — M5416 Radiculopathy, lumbar region: Secondary | ICD-10-CM

## 2016-05-13 DIAGNOSIS — G894 Chronic pain syndrome: Secondary | ICD-10-CM

## 2016-05-15 ENCOUNTER — Other Ambulatory Visit: Payer: Self-pay | Admitting: Sports Medicine

## 2016-05-17 ENCOUNTER — Emergency Department
Admission: EM | Admit: 2016-05-17 | Discharge: 2016-05-17 | Disposition: A | Discharge status: Home / Self Care | Payer: Commercial Managed Care - HMO | Source: Home / Self Care | Attending: Family Medicine | Admitting: Family Medicine

## 2016-05-17 ENCOUNTER — Encounter: Payer: Self-pay | Admitting: *Deleted

## 2016-05-17 DIAGNOSIS — J069 Acute upper respiratory infection, unspecified: Secondary | ICD-10-CM | POA: Diagnosis not present

## 2016-05-17 DIAGNOSIS — B9789 Other viral agents as the cause of diseases classified elsewhere: Secondary | ICD-10-CM | POA: Diagnosis not present

## 2016-05-17 DIAGNOSIS — H66003 Acute suppurative otitis media without spontaneous rupture of ear drum, bilateral: Secondary | ICD-10-CM

## 2016-05-17 MED ORDER — BENZONATATE 200 MG PO CAPS
ORAL_CAPSULE | ORAL | 0 refills | Status: DC
Start: 2016-05-17 — End: 2016-08-05

## 2016-05-17 MED ORDER — AMOXICILLIN 875 MG PO TABS: 875.0000 mg | ORAL_TABLET | Freq: Two times a day (BID) | ORAL | 0 refills | Status: DC

## 2016-05-17 NOTE — ED Provider Notes (Signed)
Sara Wolfe CARE    CSN: [id number] Arrival date & time: 05/17/16  0904     History   Chief Complaint Chief Complaint  Patient presents with  . Diarrhea  . Otalgia  . Cough    HPI Sara Wolfe is a 56 y.o. female.   Five days ago patient developed nausea/vomiting and diarrhea that lasted only one day.  The next day she developed a non-productive cough, fatigue, headache, myalgias, chills, and left earache.  Last night she developed cough and increasing sore throat.   The history is provided by the patient.    Past Medical History:  Diagnosis Date  . GERD (gastroesophageal reflux disease)   . Hot flashes, menopausal   . Medical history non-contributory   . PONV (postoperative nausea and vomiting)     Patient Active Problem List   Diagnosis Date Noted  . Anasarca 11/10/2015  . Chronic pain syndrome 10/16/2015  . Hyperlipidemia 05/20/2015  . Migraine headache 05/16/2015  . Obstructive sleep apnea 05/16/2015  . Lateral epicondylitis of left elbow 05/09/2015  . Dyspepsia 05/09/2015  . Left lumbar radiculitis 04/11/2014  . Annual physical exam 02/21/2014  . Obesity 02/21/2014  . S/P cervical spinal fusion 10/04/2013  . Hot flash, menopausal 12/27/2011    Past Surgical History:  Procedure Laterality Date  . ANTERIOR CERVICAL DECOMP/DISCECTOMY FUSION N/A 10/04/2013   Procedure: ANTERIOR CERVICAL DECOMPRESSION/DISCECTOMY FUSION 3 LEVELS;  Surgeon: Sinclair Ship, MD;  Location: [facility];  Service: Orthopedics;  Laterality: N/A;  Anterior cervical decompression fusion cervical 4-5, cervical 5-6, cervical 6-7 with instrumentation and allograft  . ANTERIOR CERVICAL DECOMP/DISCECTOMY FUSION N/A 04/11/2014   Procedure: ANTERIOR CERVICAL DECOMPRESSION/DISCECTOMY FUSION 1 LEVEL;  Surgeon: Sinclair Ship, MD;  Location: [facility];  Service: Orthopedics;  Laterality: N/A;  Anterior cervical decompression fusion, cervical 7-thoracic 1 with instrumentation and  allograft  . FOOT SURGERY    . GASTRIC BYPASS    . TUBAL LIGATION      OB History    Gravida Para Term Preterm AB Living   2 2 2     2    SAB TAB Ectopic Multiple Live Births                   Home Medications    Prior to Admission medications   Medication Sig Start Date End Date Taking? Authorizing Provider  methocarbamol (ROBAXIN) 500 MG tablet TAKE 1 TABLET 4 TIMES A DAY 05/16/16  Yes Silverio Decamp, MD  pregabalin (LYRICA) 100 MG capsule Take 100 mg by mouth 2 (two) times daily.   Yes Historical Provider, MD  rizatriptan (MAXALT-MLT) 10 MG disintegrating tablet TAKE 1 TABLET (10 MG TOTAL) BY MOUTH AS NEEDED FOR MIGRAINE. MAY REPEAT IN 2 HOURS IF NEEDED 02/21/16  Yes Silverio Decamp, MD  topiramate (TOPAMAX) 50 MG tablet TAKE 1 TABLET (50 MG TOTAL) BY MOUTH DAILY. 02/04/16  Yes Silverio Decamp, MD  traMADol-acetaminophen (ULTRACET) 37.5-325 MG tablet TAKE 1 TABLET EVERY 8 HOURS AS NEEDED SEVERE PAIN 05/13/16  Yes Silverio Decamp, MD  venlafaxine XR (EFFEXOR-XR) 150 MG 24 hr capsule TAKE 1 CAPSULE (150 MG TOTAL) BY MOUTH DAILY WITH BREAKFAST. 05/13/16  Yes Silverio Decamp, MD  amoxicillin (AMOXIL) 875 MG tablet Take 1 tablet (875 mg total) by mouth 2 (two) times daily. 05/17/16   Kandra Nicolas, MD  benzonatate (TESSALON) 200 MG capsule Take one cap by mouth at bedtime as needed for cough.  May repeat in 4 to  6 hours 05/17/16   Kandra Nicolas, MD    Family History History reviewed. No pertinent family history.  Social History Social History  Substance Use Topics  . Smoking status: Light Tobacco Smoker    Packs/day: 0.25    Years: 2.00    Types: Cigarettes    Last attempt to quit: 04/02/2012  . Smokeless tobacco: Never Used  . Alcohol use No     Allergies   Augmentin [amoxicillin-pot clavulanate]   Review of Systems Review of Systems + sore throat + cough No pleuritic pain No wheezing + nasal congestion + post-nasal drainage No sinus  pain/pressure No itchy/red eyes + earache No hemoptysis No SOB No fever, + chills No nausea No vomiting No abdominal pain No diarrhea No urinary symptoms No skin rash + fatigue + myalgias + headache Used OTC meds without relief   Physical Exam Triage Vital Signs ED Triage Vitals  Enc Vitals Group     BP 05/17/16 0945 130/87     Pulse Rate 05/17/16 0945 78     Resp 05/17/16 0945 16     Temp 05/17/16 0945 98.5 F (36.9 C)     Temp Source 05/17/16 0945 Oral     SpO2 05/17/16 0945 97 %     Weight 05/17/16 0946 200 lb (90.7 kg)     Height 05/17/16 0946 5\' 6"  (1.676 m)     Head Circumference --      Peak Flow --      Pain Score 05/17/16 0952 0     Pain Loc --      Pain Edu? --      Excl. in [place]? --    No data found.   Updated Vital Signs BP 130/87 (BP Location: Left Arm)   Pulse 78   Temp 98.5 F (36.9 C) (Oral)   Resp 16   Ht 5\' 6"  (1.676 m)   Wt 200 lb (90.7 kg)   SpO2 97%   BMI 32.28 kg/m   Visual Acuity Right Eye Distance:   Left Eye Distance:   Bilateral Distance:    Right Eye Near:   Left Eye Near:    Bilateral Near:     Physical Exam Nursing notes and Vital Signs reviewed. Appearance:  Patient appears stated age, and in no acute distress Eyes:  Pupils are equal, round, and reactive to light and accomodation.  Extraocular movement is intact.  Conjunctivae are not inflamed  Ears:  Canals normal.  Left tympanic membrane erythematous and bulging.  Right tympanic membrane erythematous. Nose:  Congested turbinates.  No sinus tenderness.   Pharynx:  Normal Neck:  Supple.  Tender enlarged posterior/lateral nodes are palpated bilaterally.  Tonsillar nodes also tender to palpation bilaterally. Lungs:  Clear to auscultation.  Breath sounds are equal.  Moving air well. Heart:  Regular rate and rhythm without murmurs, rubs, or gallops.  Abdomen:  Nontender without masses or hepatosplenomegaly.  Bowel sounds are present.  No CVA or flank tenderness.    Extremities:  No edema.  Skin:  No rash present.    UC Treatments / Results  Labs (all labs ordered are listed, but only abnormal results are displayed) Labs Reviewed - No data to display  EKG  EKG Interpretation None       Radiology No results found.  Procedures Procedures (including critical care time)  Medications Ordered in UC Medications - No data to display   Initial Impression / Assessment and Plan / UC Course  I have  reviewed the triage vital signs and the nursing notes.  Pertinent labs & imaging results that were available during my care of the patient were reviewed by me and considered in my medical decision making (see chart for details).    Begin amoxicillin.   Prescription written for Benzonatate [facility]) to take at bedtime for night-time cough.  Take plain guaifenesin (1200mg  extended release tabs such as Mucinex) twice daily, with plenty of water, for cough and congestion.  May add Pseudoephedrine (30mg , one or two every 4 to 6 hours) for sinus congestion.  Get adequate rest.   May use Afrin nasal spray (or generic oxymetazoline) each morning for about 5 days and then discontinue.  Also recommend using saline nasal spray several times daily and saline nasal irrigation (AYR is a common brand).  Use Flonase nasal spray each morning after using Afrin nasal spray and saline nasal irrigation. Try warm salt water gargles for sore throat.  Stop all antihistamines for now, and other non-prescription cough/cold preparations. May take Ibuprofen 200mg , 4 tabs every 8 hours with food for chest/sternum discomfort.   Follow-up with family doctor if not improving about10 days.    Final Clinical Impressions(s) / UC Diagnoses   Final diagnoses:  Viral URI with cough  Acute suppurative otitis media of both ears without spontaneous rupture of tympanic membranes, recurrence not specified    New Prescriptions New Prescriptions   AMOXICILLIN (AMOXIL) 875 MG TABLET     Take 1 tablet (875 mg total) by mouth 2 (two) times daily.   BENZONATATE (TESSALON) 200 MG CAPSULE    Take one cap by mouth at bedtime as needed for cough.  May repeat in 4 to 6 hours     Kandra Nicolas, MD 05/30/16 2052

## 2016-05-17 NOTE — ED Triage Notes (Signed)
Pt c/o diarrhea, bilateral ear ache and nonproductive cough x 4 days. Denies fever. Had gastric bypass surgery 03/15/16.

## 2016-05-17 NOTE — Discharge Instructions (Signed)
Take plain guaifenesin (1200mg  extended release tabs such as Mucinex) twice daily, with plenty of water, for cough and congestion.  May add Pseudoephedrine (30mg , one or two every 4 to 6 hours) for sinus congestion.  Get adequate rest.   May use Afrin nasal spray (or generic oxymetazoline) each morning for about 5 days and then discontinue.  Also recommend using saline nasal spray several times daily and saline nasal irrigation (AYR is a common brand).  Use Flonase nasal spray each morning after using Afrin nasal spray and saline nasal irrigation. Try warm salt water gargles for sore throat.  Stop all antihistamines for now, and other non-prescription cough/cold preparations. May take Ibuprofen 200mg , 4 tabs every 8 hours with food for chest/sternum discomfort.   Follow-up with family doctor if not improving about10 days.

## 2016-06-04 ENCOUNTER — Other Ambulatory Visit: Payer: Self-pay | Admitting: Sports Medicine

## 2016-07-16 ENCOUNTER — Encounter: Payer: Self-pay | Admitting: Sports Medicine

## 2016-07-16 ENCOUNTER — Other Ambulatory Visit: Payer: Self-pay | Admitting: Sports Medicine

## 2016-08-05 ENCOUNTER — Encounter: Payer: Self-pay | Admitting: *Deleted

## 2016-08-05 ENCOUNTER — Emergency Department
Admission: EM | Admit: 2016-08-05 | Discharge: 2016-08-05 | Disposition: A | Discharge status: Home / Self Care | Payer: 59 | Source: Home / Self Care | Attending: Family Medicine | Admitting: Family Medicine

## 2016-08-05 DIAGNOSIS — R197 Diarrhea, unspecified: Secondary | ICD-10-CM

## 2016-08-05 LAB — POCT CBC W AUTO DIFF (K'VILLE URGENT CARE)

## 2016-08-05 LAB — BASIC METABOLIC PANEL
BUN: 20 mg/dL (ref 7–25)
CO2: 22 mmol/L (ref 20–31)
CREATININE: 0.62 mg/dL (ref 0.50–1.05)
Calcium: 8.6 mg/dL (ref 8.6–10.4)
Chloride: 112 mmol/L — ABNORMAL HIGH (ref 98–110)
GLUCOSE: 92 mg/dL (ref 65–99)
Potassium: 3.7 mmol/L (ref 3.5–5.3)
Sodium: 142 mmol/L (ref 135–146)

## 2016-08-05 MED ORDER — ONDANSETRON 4 MG PO TBDP
4.0000 mg | ORAL_TABLET | Freq: Once | ORAL | Status: AC
  Administered 2016-08-05: 4 mg via ORAL

## 2016-08-05 MED ORDER — ONDANSETRON 4 MG PO TBDP: ORAL_TABLET | ORAL | 0 refills | Status: DC

## 2016-08-05 NOTE — Discharge Instructions (Signed)
Begin clear liquids (Pedialyte while having diarrhea) until improved, then advance to a BRAT diet (Bananas, Rice, Applesauce, Toast).  Then gradually resume a regular diet when tolerated.  Avoid milk products until well.  ° °If symptoms become significantly worse during the night or over the weekend, proceed to the local emergency room. ° °

## 2016-08-05 NOTE — ED Provider Notes (Signed)
[name] CARE    CSN: [id number] Arrival date & time: 08/05/16  0903     History   Chief Complaint Chief Complaint  Patient presents with  . Diarrhea  . Nausea    HPI Sara Wolfe is a 56 y.o. female.   Patient reports that she developed mild nausea after eating at a restaurant night before last.  Yesterday she awoke with fatigue, headache, low grade fever, and stools that were more loose than usual.  She has had a persistent feeling of mild nausea without vomiting.  She had gastric bypass earlier this year and normally has stools that are somewhat loose.  She denies abdominal pain.  No urinary symptoms, and urination has been normal.  Denies recent foreign travel, or drinking untreated water in a wilderness environment.  She denies recent antibiotic use.    The history is provided by the patient.  Diarrhea  Quality:  Watery Severity:  Mild Onset quality:  Gradual Duration:  1 day Timing:  Sporadic Progression:  Unchanged Relieved by:  None tried Worsened by:  Nothing Ineffective treatments:  None tried Associated symptoms: chills, fever and headaches   Associated symptoms: no abdominal pain, no arthralgias, no recent cough, no diaphoresis, no myalgias, no URI and no vomiting   Risk factors: no recent antibiotic use, no sick contacts, no suspicious food intake and no travel to endemic areas     Past Medical History:  Diagnosis Date  . GERD (gastroesophageal reflux disease)   . Hot flashes, menopausal   . Medical history non-contributory   . PONV (postoperative nausea and vomiting)     Patient Active Problem List   Diagnosis Date Noted  . Anasarca 11/10/2015  . Chronic pain syndrome 10/16/2015  . Hyperlipidemia 05/20/2015  . Migraine headache 05/16/2015  . Obstructive sleep apnea 05/16/2015  . Lateral epicondylitis of left elbow 05/09/2015  . Dyspepsia 05/09/2015  . Left lumbar radiculitis 04/11/2014  . Annual physical exam 02/21/2014  . Obesity  02/21/2014  . S/P cervical spinal fusion 10/04/2013  . Hot flash, menopausal 12/27/2011    Past Surgical History:  Procedure Laterality Date  . ANTERIOR CERVICAL DECOMP/DISCECTOMY FUSION N/A 10/04/2013   Procedure: ANTERIOR CERVICAL DECOMPRESSION/DISCECTOMY FUSION 3 LEVELS;  Surgeon: Sinclair Ship, MD;  Location: [facility];  Service: Orthopedics;  Laterality: N/A;  Anterior cervical decompression fusion cervical 4-5, cervical 5-6, cervical 6-7 with instrumentation and allograft  . ANTERIOR CERVICAL DECOMP/DISCECTOMY FUSION N/A 04/11/2014   Procedure: ANTERIOR CERVICAL DECOMPRESSION/DISCECTOMY FUSION 1 LEVEL;  Surgeon: Sinclair Ship, MD;  Location: [place] [facility];  Service: Orthopedics;  Laterality: N/A;  Anterior cervical decompression fusion, cervical 7-thoracic 1 with instrumentation and allograft  . FOOT SURGERY    . GASTRIC BYPASS    . TUBAL LIGATION      OB History    Gravida Para Term Preterm AB Living   2 2 2     2    SAB TAB Ectopic Multiple Live Births                   Home Medications    Prior to Admission medications   Medication Sig Start Date End Date Taking? Authorizing Provider  LYRICA 100 MG capsule TAKE ONE CAPSULE TWICE A DAY 07/17/16   Silverio Decamp, MD  methocarbamol (ROBAXIN) 500 MG tablet TAKE 1 TABLET 4 TIMES A DAY 05/16/16   Silverio Decamp, MD  ondansetron (ZOFRAN ODT) 4 MG disintegrating tablet Take one tab by mouth Q6hr prn nausea.  Dissolve under tongue. 08/05/16   Kandra Nicolas, MD  rizatriptan (MAXALT-MLT) 10 MG disintegrating tablet TAKE 1 TABLET (10 MG TOTAL) BY MOUTH AS NEEDED FOR MIGRAINE. MAY REPEAT IN 2 HOURS IF NEEDED 02/21/16   Silverio Decamp, MD  topiramate (TOPAMAX) 50 MG tablet TAKE 1 TABLET (50 MG TOTAL) BY MOUTH DAILY. 02/04/16   Silverio Decamp, MD  traMADol-acetaminophen (ULTRACET) 37.5-325 MG tablet TAKE 1 TABLET EVERY 8 HOURS AS NEEDED SEVERE PAIN 05/13/16   Silverio Decamp, MD  venlafaxine XR  (EFFEXOR-XR) 150 MG 24 hr capsule TAKE 1 CAPSULE (150 MG TOTAL) BY MOUTH DAILY WITH BREAKFAST. 05/13/16   Silverio Decamp, MD    Family History History reviewed. No pertinent family history.  Social History Social History  Substance Use Topics  . Smoking status: Light Tobacco Smoker    Packs/day: 0.25    Years: 2.00    Types: Cigarettes    Last attempt to quit: 04/02/2012  . Smokeless tobacco: Never Used  . Alcohol use No     Allergies   Augmentin [amoxicillin-pot clavulanate]   Review of Systems Review of Systems  Constitutional: Positive for chills and fever. Negative for diaphoresis.  Gastrointestinal: Positive for diarrhea and nausea. Negative for abdominal pain, blood in stool and vomiting.  Musculoskeletal: Negative for arthralgias and myalgias.  Neurological: Positive for headaches.  All other systems reviewed and are negative.    Physical Exam Triage Vital Signs ED Triage Vitals [08/05/16 0921]  Enc Vitals Group     BP 108/76     Pulse Rate 61     Resp      Temp 99 F (37.2 C)     Temp Source Oral     SpO2 97 %     Weight 170 lb 3.2 oz (77.2 kg)     Height      Head Circumference      Peak Flow      Pain Score 0     Pain Loc      Pain Edu?      Excl. in [place]?    No data found.   Updated Vital Signs BP 108/76 (BP Location: Left Arm)   Pulse 61   Temp 99 F (37.2 C) (Oral)   Wt 170 lb 3.2 oz (77.2 kg)   SpO2 97%   BMI 27.47 kg/m   Visual Acuity Right Eye Distance:   Left Eye Distance:   Bilateral Distance:    Right Eye Near:   Left Eye Near:    Bilateral Near:     Physical Exam Nursing notes and Vital Signs reviewed. Appearance:  Patient appears stated age, and in no acute distress.    Eyes:  Pupils are equal, round, and reactive to light and accomodation.  Extraocular movement is intact.  Conjunctivae are not inflamed   Pharynx:  Normal; moist mucous membranes  Neck:  Supple.  No adenopathy Lungs:  Clear to auscultation.   Breath sounds are equal.  Moving air well. Heart:  Regular rate and rhythm without murmurs, rubs, or gallops.  Abdomen:  Nontender without masses or hepatosplenomegaly.  Bowel sounds are present.  No CVA or flank tenderness.  Extremities:  No edema.  Skin:  No rash present.     UC Treatments / Results  Labs (all labs ordered are listed, but only abnormal results are displayed) Labs Reviewed  BASIC METABOLIC PANEL  POCT CBC W AUTO DIFF ([place]):  WBC 7.7; LY 32.0; MO 3.4;  GR 64.6; Hgb 13.3; Platelets 282     EKG  EKG Interpretation None       Radiology No results found.  Procedures Procedures (including critical care time)  Medications Ordered in UC Medications  ondansetron (ZOFRAN-ODT) disintegrating tablet 4 mg (4 mg Oral Given 08/05/16 1005)     Initial Impression / Assessment and Plan / UC Course  I have reviewed the triage vital signs and the nursing notes.  Pertinent labs & imaging results that were available during my care of the patient were reviewed by me and considered in my medical decision making (see chart for details).    Normal WBC reassuring.  BMP pending. Suspect viral gastroenteritis. Administered Zofran ODT 4mg  PO; given Rx for same Begin clear liquids (Pedialyte while having diarrhea) until improved, then advance to a [employer] (Bananas, Rice, Applesauce, Toast).  Then gradually resume a regular diet when tolerated.  Avoid milk products until well.     If symptoms become significantly worse during the night or over the weekend, proceed to the local emergency room.     Final Clinical Impressions(s) / UC Diagnoses   Final diagnoses:  Diarrhea, unspecified type    New Prescriptions New Prescriptions   ONDANSETRON (ZOFRAN ODT) 4 MG DISINTEGRATING TABLET    Take one tab by mouth Q6hr prn nausea.  Dissolve under tongue.     Kandra Nicolas, MD 08/05/16 1016

## 2016-08-05 NOTE — ED Triage Notes (Signed)
Patient c/o Nausea and diarrhea x yesterday. No vomiting. H/o gastric bypass 4 months ago.

## 2016-08-06 ENCOUNTER — Telehealth: Payer: Self-pay

## 2016-08-06 NOTE — Telephone Encounter (Signed)
Left message on VM to call if questions or concerns.

## 2016-08-09 ENCOUNTER — Other Ambulatory Visit: Payer: Self-pay | Admitting: Sports Medicine

## 2016-08-09 DIAGNOSIS — G43809 Other migraine, not intractable, without status migrainosus: Secondary | ICD-10-CM

## 2016-08-11 DIAGNOSIS — Z9884 Bariatric surgery status: Secondary | ICD-10-CM | POA: Diagnosis not present

## 2016-08-11 DIAGNOSIS — K912 Postsurgical malabsorption, not elsewhere classified: Secondary | ICD-10-CM | POA: Diagnosis not present

## 2016-08-18 ENCOUNTER — Ambulatory Visit: Payer: Self-pay | Admitting: Sports Medicine

## 2016-08-19 ENCOUNTER — Ambulatory Visit: Payer: 59

## 2016-08-19 ENCOUNTER — Ambulatory Visit (INDEPENDENT_AMBULATORY_CARE_PROVIDER_SITE_OTHER): Payer: 59 | Admitting: Sports Medicine

## 2016-08-19 ENCOUNTER — Ambulatory Visit (INDEPENDENT_AMBULATORY_CARE_PROVIDER_SITE_OTHER): Payer: 59

## 2016-08-19 DIAGNOSIS — Z981 Arthrodesis status: Secondary | ICD-10-CM

## 2016-08-19 DIAGNOSIS — G4733 Obstructive sleep apnea (adult) (pediatric): Secondary | ICD-10-CM | POA: Diagnosis not present

## 2016-08-19 DIAGNOSIS — G43809 Other migraine, not intractable, without status migrainosus: Secondary | ICD-10-CM

## 2016-08-19 DIAGNOSIS — G894 Chronic pain syndrome: Secondary | ICD-10-CM | POA: Diagnosis not present

## 2016-08-19 DIAGNOSIS — R1011 Right upper quadrant pain: Secondary | ICD-10-CM

## 2016-08-19 DIAGNOSIS — M5416 Radiculopathy, lumbar region: Secondary | ICD-10-CM

## 2016-08-19 DIAGNOSIS — S2231XA Fracture of one rib, right side, initial encounter for closed fracture: Secondary | ICD-10-CM | POA: Insufficient documentation

## 2016-08-19 LAB — CBC
HCT: 41.6 % (ref 35.0–45.0)
Hemoglobin: 13.3 g/dL (ref 11.7–15.5)
MCH: 30.9 pg (ref 27.0–33.0)
MCHC: 32 g/dL (ref 32.0–36.0)
MCV: 96.7 fL (ref 80.0–100.0)
MPV: 10.1 fL (ref 7.5–12.5)
Platelets: 269 10*3/uL (ref 140–400)
RBC: 4.3 MIL/uL (ref 3.80–5.10)
RDW: 14.7 % (ref 11.0–15.0)
WBC: 7.9 10*3/uL (ref 3.8–10.8)

## 2016-08-19 MED ORDER — TOPIRAMATE 50 MG PO TABS: 50.0000 mg | ORAL_TABLET | Freq: Every day | ORAL | 1 refills | Status: DC

## 2016-08-19 MED ORDER — TRAMADOL-ACETAMINOPHEN 37.5-325 MG PO TABS: 1.0000 | ORAL_TABLET | Freq: Two times a day (BID) | ORAL | 3 refills | Status: DC | PRN

## 2016-08-19 MED ORDER — VENLAFAXINE HCL ER 150 MG PO CP24: 150.0000 mg | ORAL_CAPSULE | Freq: Every day | ORAL | 3 refills | Status: DC

## 2016-08-19 MED ORDER — PREGABALIN 150 MG PO CAPS: 150.0000 mg | ORAL_CAPSULE | Freq: Two times a day (BID) | ORAL | 3 refills | Status: DC

## 2016-08-19 NOTE — Assessment & Plan Note (Signed)
HIDA scan, CBC, CMP, lipase, amylase. Patient is getting some biliary emesis. CT abdomen and pelvis was unremarkable so avoiding ultrasound for now.

## 2016-08-19 NOTE — Assessment & Plan Note (Signed)
80 pound weight loss with sleeve gastrectomy, new sleep study.

## 2016-08-19 NOTE — Assessment & Plan Note (Addendum)
Post C4-T1 fusion, now with persistence of periscapular back pain, this likely is from her cervical spine. Checking a new set of x-rays.  Cervical spine x-rays do show some increased motion at the C7-T1 level that is concerning for incomplete fusion. 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 If there is persistent pain after a few weeks of conservative treatment we can consider a new MRI followed by facet joint injections, if facet joint injections provided no relief then she would need to speak her spine surgeon regarding the integrity of the fusion at C7-T1.

## 2016-08-19 NOTE — Progress Notes (Signed)
  Subjective:    CC: Multiple issues  HPI: Sara Wolfe is post sleeve gastrectomy, she's lost 80 pounds.  She had a previous diagnosis of obstructive sleep apnea and would like to retest, tells me many of her symptoms have resolved. This would allow her to stop CPAP.  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 She is having recurrence of pain in the left and right upper back. Nothing overtly radicular at this time.  Abdominal pain: Right upper quadrant, occasional radiation to the right scapula, unclear as to whether this is coming from her ACDF or her gallbladder, she does get occasional biliary-type emesis. Had a recent abdominal CT that was negative.  Needs refills on various other medications.  Past medical history:  Negative.  See flowsheet/record as well for more information.  Surgical history: Negative.  See flowsheet/record as well for more information.  Family history: Negative.  See flowsheet/record as well for more information.  Social history: Negative.  See flowsheet/record as well for more information.  Allergies, and medications have been entered into the medical record, reviewed, and no changes needed.   Review of Systems: No fevers, chills, night sweats, weight loss, chest pain, or shortness of breath.   Objective:    General: Well Developed, well nourished, and in no acute distress.  Neuro: Alert and oriented x3, extra-ocular muscles intact, sensation grossly intact.  HEENT: Normocephalic, atraumatic, pupils equal round reactive to light, neck supple, no masses, no lymphadenopathy, thyroid nonpalpable.  Skin: Warm and dry, no rashes. Cardiac: Regular rate and rhythm, no murmurs rubs or gallops, no lower extremity edema.  Respiratory: Clear to auscultation bilaterally. Not using accessory muscles, speaking in full sentences. Abdomen: Soft, nontender, nondistended, normal bowel sounds, no palpable masses, no guarding, rigidity, rebound  tenderness.  No costovertebral angle pain.  Impression and Recommendations:    Obstructive sleep apnea 80 pound weight loss with sleeve gastrectomy, new sleep study.  S/P cervical spinal fusion Post C7-T1 fusion, now with persistence of periscapular back pain, this likely is from her cervical spine. Checking a new set of x-rays.  Abdominal pain, right upper quadrant HIDA scan, CBC, CMP, lipase, amylase. Patient is getting some biliary emesis. CT abdomen and pelvis was unremarkable so avoiding ultrasound for now.  I spent 40 minutes with this patient, greater than 50% was face-to-face time counseling regarding the above diagnoses

## 2016-08-20 LAB — AMYLASE: Amylase: 32 U/L (ref 21–101)

## 2016-08-20 LAB — COMPREHENSIVE METABOLIC PANEL
ALT: 11 U/L (ref 6–29)
AST: 14 U/L (ref 10–35)
Alkaline Phosphatase: 56 U/L (ref 33–130)
BUN: 17 mg/dL (ref 7–25)
Glucose, Bld: 88 mg/dL (ref 65–99)
Total Bilirubin: 0.5 mg/dL (ref 0.2–1.2)

## 2016-08-20 LAB — LIPASE: Lipase: 17 U/L (ref 7–60)

## 2016-08-20 LAB — COMPREHENSIVE METABOLIC PANEL WITH GFR
Albumin: 4 g/dL (ref 3.6–5.1)
CO2: 24 mmol/L (ref 20–31)
Calcium: 9.1 mg/dL (ref 8.6–10.4)
Chloride: 110 mmol/L (ref 98–110)
Creat: 0.61 mg/dL (ref 0.50–1.05)
Potassium: 4.5 mmol/L (ref 3.5–5.3)
Sodium: 142 mmol/L (ref 135–146)
Total Protein: 6.1 g/dL (ref 6.1–8.1)

## 2016-08-26 DIAGNOSIS — Z9884 Bariatric surgery status: Secondary | ICD-10-CM | POA: Diagnosis not present

## 2016-08-26 DIAGNOSIS — K909 Intestinal malabsorption, unspecified: Secondary | ICD-10-CM | POA: Diagnosis not present

## 2016-08-31 DIAGNOSIS — M546 Pain in thoracic spine: Secondary | ICD-10-CM | POA: Diagnosis not present

## 2016-09-06 DIAGNOSIS — M546 Pain in thoracic spine: Secondary | ICD-10-CM | POA: Diagnosis not present

## 2016-09-07 ENCOUNTER — Encounter: Payer: Self-pay | Admitting: Sports Medicine

## 2016-09-10 DIAGNOSIS — G4730 Sleep apnea, unspecified: Secondary | ICD-10-CM | POA: Diagnosis not present

## 2016-09-10 DIAGNOSIS — M546 Pain in thoracic spine: Secondary | ICD-10-CM | POA: Diagnosis not present

## 2016-09-14 DIAGNOSIS — M546 Pain in thoracic spine: Secondary | ICD-10-CM | POA: Diagnosis not present

## 2016-09-16 DIAGNOSIS — M546 Pain in thoracic spine: Secondary | ICD-10-CM | POA: Diagnosis not present

## 2016-09-27 DIAGNOSIS — M546 Pain in thoracic spine: Secondary | ICD-10-CM | POA: Diagnosis not present

## 2016-10-19 ENCOUNTER — Telehealth: Payer: Self-pay

## 2016-10-19 NOTE — Telephone Encounter (Signed)
Pt left VM asking for the results of her home sleep study. Please advise.

## 2016-10-19 NOTE — Telephone Encounter (Signed)
Sleep apnea is still present with an AHI of 15.7, recommended CPAP pressure is 5 cm

## 2016-10-20 MED ORDER — AMBULATORY NON FORMULARY MEDICATION: 0 refills | Status: DC

## 2016-10-20 NOTE — Telephone Encounter (Signed)
Pt notified, is in need of a new order to be sent to [facility]. Please assist.

## 2016-10-20 NOTE — Telephone Encounter (Signed)
Rx in box. 

## 2016-11-29 ENCOUNTER — Encounter: Payer: Self-pay | Admitting: Sports Medicine

## 2016-11-29 ENCOUNTER — Ambulatory Visit (INDEPENDENT_AMBULATORY_CARE_PROVIDER_SITE_OTHER): Payer: 59 | Admitting: Sports Medicine

## 2016-11-29 DIAGNOSIS — G43809 Other migraine, not intractable, without status migrainosus: Secondary | ICD-10-CM | POA: Diagnosis not present

## 2016-11-29 DIAGNOSIS — Z903 Acquired absence of stomach [part of]: Secondary | ICD-10-CM | POA: Diagnosis not present

## 2016-11-29 DIAGNOSIS — G894 Chronic pain syndrome: Secondary | ICD-10-CM | POA: Diagnosis not present

## 2016-11-29 DIAGNOSIS — Z23 Encounter for immunization: Secondary | ICD-10-CM | POA: Diagnosis not present

## 2016-11-29 MED ORDER — PREGABALIN 200 MG PO CAPS: 200.0000 mg | ORAL_CAPSULE | Freq: Two times a day (BID) | ORAL | 3 refills | Status: DC

## 2016-11-29 MED ORDER — TOPIRAMATE 100 MG PO TABS: 100.0000 mg | ORAL_TABLET | Freq: Every day | ORAL | 3 refills | Status: DC

## 2016-11-29 NOTE — Assessment & Plan Note (Signed)
Fantastic weight loss of 100 pounds over the past year. She is going to [place] for abdominoplasty and mammoplasty. I have asked her to be cautious about overseas plastic surgery.

## 2016-11-29 NOTE — Assessment & Plan Note (Signed)
Doing well with occasional Ultracet, Lyrica. Increasing Lyrica to 200 mg twice a day, current dose does not seem to be lasting quite long enough.

## 2016-11-29 NOTE — Assessment & Plan Note (Signed)
Migraine headaches every day, increasing Topamax to 100 mg nightly.

## 2016-11-29 NOTE — Progress Notes (Signed)
  Subjective:    CC:  Follow-up  HPI: Migraine headaches: Initially were controlled with Topamax, now having worsening of headaches.  Chronic pain syndrome: Well-controlled traditionally with Lyrica, needs to go up on the dose, finds as though it doesn't last quite as long.  Post gastric sleeve: Has lost 100 pounds over the past year, she is going to [place] in [place] for abdominoplasty and mammoplasty.  Past medical history:  Negative.  See flowsheet/record as well for more information.  Surgical history: Negative.  See flowsheet/record as well for more information.  Family history: Negative.  See flowsheet/record as well for more information.  Social history: Negative.  See flowsheet/record as well for more information.  Allergies, and medications have been entered into the medical record, reviewed, and no changes needed.   Review of Systems: No fevers, chills, night sweats, weight loss, chest pain, or shortness of breath.   Objective:    General: Well Developed, well nourished, and in no acute distress.  Neuro: Alert and oriented x3, extra-ocular muscles intact, sensation grossly intact.  HEENT: Normocephalic, atraumatic, pupils equal round reactive to light, neck supple, no masses, no lymphadenopathy, thyroid nonpalpable.  Skin: Warm and dry, no rashes. Cardiac: Regular rate and rhythm, no murmurs rubs or gallops, no lower extremity edema.  Respiratory: Clear to auscultation bilaterally. Not using accessory muscles, speaking in full sentences.  Impression and Recommendations:    Migraine headache Migraine headaches every day, increasing Topamax to 100 mg nightly.  Chronic pain syndrome Doing well with occasional Ultracet, Lyrica. Increasing Lyrica to 200 mg twice a day, current dose does not seem to be lasting quite long enough.  History of sleeve gastrectomy Fantastic weight loss of 100 pounds over the past year. She is going to [place] for abdominoplasty and  mammoplasty. I have asked her to be cautious about overseas plastic surgery.  ___________________________________________ Gwen Her. [name], M.D., ABFM., CAQSM. Primary Care and [place] Instructor of [place] [employer] [facility] [employer]

## 2016-12-07 ENCOUNTER — Telehealth: Payer: Self-pay | Admitting: *Deleted

## 2016-12-07 NOTE — Telephone Encounter (Signed)
Pre Authorization sent to cover my meds.[id number]

## 2016-12-09 NOTE — Telephone Encounter (Signed)
Response back from insurance company in regard to prior auth request is : Denied.  Current plan approved combo opioid products are covered up to 40 tablets per month and. So basically she may have to pay out of pocket for additional 20 so she can have #60.Left message on patient's vm and insurance will be mailing a letter to about this

## 2016-12-15 ENCOUNTER — Emergency Department (INDEPENDENT_AMBULATORY_CARE_PROVIDER_SITE_OTHER)
Admission: EM | Admit: 2016-12-15 | Discharge: 2016-12-15 | Disposition: A | Discharge status: Home / Self Care | Payer: 59 | Source: Home / Self Care | Attending: Family Medicine | Admitting: Family Medicine

## 2016-12-15 ENCOUNTER — Encounter: Payer: Self-pay | Admitting: Emergency Medicine

## 2016-12-15 DIAGNOSIS — R5383 Other fatigue: Secondary | ICD-10-CM

## 2016-12-15 DIAGNOSIS — R531 Weakness: Secondary | ICD-10-CM

## 2016-12-15 DIAGNOSIS — Z9884 Bariatric surgery status: Secondary | ICD-10-CM

## 2016-12-15 LAB — POCT CBC W AUTO DIFF (K'VILLE URGENT CARE)

## 2016-12-15 NOTE — ED Triage Notes (Signed)
Patient reports extreme fatigue since 3 days ago; denies other symptoms.

## 2016-12-15 NOTE — ED Provider Notes (Signed)
[name]    CSN: [id number] Arrival date & time: 12/15/16  1527     History   Chief Complaint Chief Complaint  Patient presents with  . Fatigue    HPI Sara Wolfe is a 56 y.o. female.   HPI Sara Wolfe is a 56 y.o. female presenting to UC with c/o worsening fatigue over the last 3 days but states it has worsened over the last one week. She notes it is "extreme."  She tries to get 8 hours of sleep at night but is currently working 60 hours a week over the last few months, 12 hour shifts that require prolonged standing.  She also reports having gastric bypass in [place] in January 2018.  She was instructed to take a daily vitamin but due to stomach upset, she has not been taking them as suggested.  Denies abdominal pain, urinary symptoms, fever, chills, n/v/d. Denies hx of thyroid problems.  No recent medication changes.  She did see her PCP about 2 weeks ago for migraines and chronic pain but was not having the fatigue at that time.  Pt concerned her vitamins are low due to not taking her vitamins as suggested.  She has been able to eat and drink okay.  She has f/u with her PCP on Friday, 12/17/16 but felt like she would not be able to make it through work today or tomorrow due to the fatigue.    Past Medical History:  Diagnosis Date  . GERD (gastroesophageal reflux disease)   . Hot flashes, menopausal   . Medical history non-contributory   . PONV (postoperative nausea and vomiting)     Patient Active Problem List   Diagnosis Date Noted  . Abdominal pain, right upper quadrant 08/19/2016  . Chronic pain syndrome 10/16/2015  . Hyperlipidemia 05/20/2015  . Migraine headache 05/16/2015  . Obstructive sleep apnea 05/16/2015  . Lateral epicondylitis of left elbow 05/09/2015  . Left lumbar radiculitis 04/11/2014  . Annual physical exam 02/21/2014  . 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 S/P cervical spinal fusion 10/04/2013  . Hot flash, menopausal  12/27/2011    Past Surgical History:  Procedure Laterality Date  . ANTERIOR CERVICAL DECOMP/DISCECTOMY FUSION N/A 10/04/2013   Procedure: ANTERIOR CERVICAL DECOMPRESSION/DISCECTOMY FUSION 3 LEVELS;  Surgeon: Sinclair Ship, MD;  Location: [facility];  Service: Orthopedics;  Laterality: N/A;  Anterior cervical decompression fusion cervical 4-5, cervical 5-6, cervical 6-7 with instrumentation and allograft  . ANTERIOR CERVICAL DECOMP/DISCECTOMY FUSION N/A 04/11/2014   Procedure: ANTERIOR CERVICAL DECOMPRESSION/DISCECTOMY FUSION 1 LEVEL;  Surgeon: Sinclair Ship, MD;  Location: [facility];  Service: Orthopedics;  Laterality: N/A;  Anterior cervical decompression fusion, cervical 7-thoracic 1 with instrumentation and allograft  . FOOT SURGERY    . GASTRIC BYPASS    . TUBAL LIGATION      OB History    Gravida Para Term Preterm AB Living   2 2 2     2    SAB TAB Ectopic Multiple Live Births                   Home Medications    Prior to Admission medications   Medication Sig Start Date End Date Taking? Authorizing Provider  AMBULATORY NON FORMULARY MEDICATION Continuous positive airway pressure (CPAP) machine set at 5 cm of H2O pressure (or autoPAP if available), with all supplemental supplies as needed.  To [employer]. AHI=15 10/20/16   Silverio Decamp, MD  pregabalin (LYRICA) 200 MG capsule Take 1  capsule (200 mg total) by mouth 2 (two) times daily. 11/29/16   Silverio Decamp, MD  rizatriptan (MAXALT-MLT) 10 MG disintegrating tablet TAKE 1 TABLET (10 MG TOTAL) BY MOUTH AS NEEDED FOR MIGRAINE. MAY REPEAT IN 2 HOURS IF NEEDED 02/21/16   Silverio Decamp, MD  topiramate (TOPAMAX) 100 MG tablet Take 1 tablet (100 mg total) by mouth at bedtime. 11/29/16   Silverio Decamp, MD  traMADol-acetaminophen (ULTRACET) 37.5-325 MG tablet Take 1 tablet by mouth 2 (two) times daily as needed. 08/19/16   Silverio Decamp, MD  venlafaxine XR (EFFEXOR-XR) 150 MG 24 hr capsule Take 1  capsule (150 mg total) by mouth daily with breakfast. 08/19/16   Silverio Decamp, MD    Family History No family history on file.  Social History Social History  Substance Use Topics  . Smoking status: Light Tobacco Smoker    Packs/day: 0.25    Years: 2.00    Types: Cigarettes    Last attempt to quit: 04/02/2012  . Smokeless tobacco: Never Used  . Alcohol use No     Allergies   Augmentin [amoxicillin-pot clavulanate]   Review of Systems Review of Systems  Constitutional: Positive for fatigue. Negative for chills and fever.  HENT: Negative for congestion, ear pain, sore throat, trouble swallowing and voice change.   Respiratory: Negative for cough and shortness of breath.   Cardiovascular: Negative for chest pain, palpitations and leg swelling.  Gastrointestinal: Negative for abdominal pain, diarrhea, nausea and vomiting.  Musculoskeletal: Negative for arthralgias, back pain and myalgias.  Skin: Negative for rash.  Neurological: Positive for weakness ( generalized). Negative for dizziness, light-headedness and headaches.     Physical Exam Triage Vital Signs ED Triage Vitals [12/15/16 1542]  Enc Vitals Group     BP 116/75     Pulse Rate (!) 59     Resp 16     Temp 98.5 F (36.9 C)     Temp Source Oral     SpO2 97 %     Weight 152 lb (68.9 kg)     Height 5\' 2"  (1.575 m)     Head Circumference      Peak Flow      Pain Score      Pain Loc      Pain Edu?      Excl. in [place]?    No data found.   Updated Vital Signs BP 116/75 (BP Location: Left Arm)   Pulse (!) 59   Temp 98.5 F (36.9 C) (Oral)   Resp 16   Ht 5\' 2"  (1.575 m)   Wt 152 lb (68.9 kg)   SpO2 97%   BMI 27.80 kg/m   Visual Acuity Right Eye Distance:   Left Eye Distance:   Bilateral Distance:    Right Eye Near:   Left Eye Near:    Bilateral Near:     Physical Exam  Constitutional: She is oriented to person, place, and time. She appears well-developed and well-nourished. No distress.    HENT:  Head: Normocephalic and atraumatic.  Mouth/Throat: Oropharynx is clear and moist.  Eyes: Pupils are equal, round, and reactive to light. Conjunctivae and EOM are normal. No scleral icterus.  Neck: Normal range of motion. Neck supple.  Cardiovascular: Normal rate and regular rhythm.   Pulmonary/Chest: Effort normal and breath sounds normal. No respiratory distress. She has no wheezes. She has no rales.  Abdominal: Soft. Bowel sounds are normal. She exhibits no distension and no  mass. There is no tenderness. There is no rebound and no guarding. No hernia.  Musculoskeletal: Normal range of motion.  Neurological: She is alert and oriented to person, place, and time. No cranial nerve deficit.  Skin: Skin is warm and dry. She is not diaphoretic.  Psychiatric: She has a normal mood and affect. Her behavior is normal.  Nursing note and vitals reviewed.    UC Treatments / Results  Labs (all labs ordered are listed, but only abnormal results are displayed) Labs Reviewed  COMPLETE METABOLIC PANEL WITH GFR  TSH  B12 AND FOLATE PANEL  VITAMIN D 25 HYDROXY (VIT D DEFICIENCY, FRACTURES)  POCT CBC W AUTO DIFF ([place])    EKG  EKG Interpretation None       Radiology No results found.  Procedures Procedures (including critical care time)  Medications Ordered in UC Medications - No data to display   Initial Impression / Assessment and Plan / UC Course  I have reviewed the triage vital signs and the nursing notes.  Pertinent labs & imaging results that were available during my care of the patient were reviewed by me and considered in my medical decision making (see chart for details).     Pt c/o fatigue and generalized weakness. Concerned it is due to not taking recommended vitamins after having gastric bypass in January 2018.   Pt appears well overall. Normal vitals.  CBC: WNL CMP, TSH, B12 and Folate panel and Vitamin D labs pending.  Pt has f/u on  12/17/16 with PCP, encouraged to keep appointment Work note provided for today and tomorrow.   Final Clinical Impressions(s) / UC Diagnoses   Final diagnoses:  Weakness  Fatigue, unspecified type  History of gastric bypass    New Prescriptions Discharge Medication List as of 12/15/2016  4:32 PM       Controlled Substance Prescriptions  Controlled Substance Registry consulted? Not Applicable   Tyrell Antonio 12/15/16 1824

## 2016-12-16 ENCOUNTER — Other Ambulatory Visit: Payer: Self-pay | Admitting: Sports Medicine

## 2016-12-16 LAB — TSH: TSH: 1.13 mIU/L (ref 0.40–4.50)

## 2016-12-16 LAB — COMPLETE METABOLIC PANEL WITH GFR
AG Ratio: 1.6 (calc) (ref 1.0–2.5)
ALT: 11 U/L (ref 6–29)
AST: 13 U/L (ref 10–35)
Albumin: 3.5 g/dL — ABNORMAL LOW (ref 3.6–5.1)
Alkaline phosphatase (APISO): 63 U/L (ref 33–130)
BUN: 15 mg/dL (ref 7–25)
CO2: 22 mmol/L (ref 20–32)
Calcium: 8.6 mg/dL (ref 8.6–10.4)
Chloride: 115 mmol/L — ABNORMAL HIGH (ref 98–110)
Creat: 0.62 mg/dL (ref 0.50–1.05)
GFR, Est African American: 116 mL/min/{1.73_m2} (ref >=60)
GFR, Est Non African American: 100 mL/min/{1.73_m2} (ref >=60)
Globulin: 2.2 g/dL (calc) (ref 1.9–3.7)
Glucose, Bld: 82 mg/dL (ref 65–99)
Potassium: 4.4 mmol/L (ref 3.5–5.3)
Sodium: 146 mmol/L (ref 135–146)
Total Bilirubin: 0.4 mg/dL (ref 0.2–1.2)
Total Protein: 5.7 g/dL — ABNORMAL LOW (ref 6.1–8.1)

## 2016-12-16 LAB — B12 AND FOLATE PANEL
Folate: 3.4 ng/mL — ABNORMAL LOW
Vitamin B-12: 183 pg/mL — ABNORMAL LOW (ref 200–1100)

## 2016-12-16 LAB — VITAMIN D 25 HYDROXY (VIT D DEFICIENCY, FRACTURES): Vit D, 25-Hydroxy: 24 ng/mL — ABNORMAL LOW (ref 30–100)

## 2016-12-16 MED ORDER — AMBULATORY NON FORMULARY MEDICATION: 0 refills | Status: DC

## 2016-12-16 MED ORDER — AMBULATORY NON FORMULARY MEDICATION: 0 refills | Status: AC

## 2016-12-16 NOTE — Telephone Encounter (Signed)
Fax #: [phone] 

## 2016-12-16 NOTE — Telephone Encounter (Signed)
rx changed, in box.

## 2016-12-16 NOTE — Telephone Encounter (Signed)
Received call from Maudie Mercury with [facility]. States the new CPAP order received was for a setting of 5cm of H2O. Pt has been using a setting of 21cm of H2O. Questions the sudden decrease. If Provider would like it to auto titrate, it should say 5-10cm of H2O. Routing.

## 2016-12-16 NOTE — Addendum Note (Signed)
Addended by: Silverio Decamp on: 12/16/2016 05:40 PM   Modules accepted: Orders

## 2016-12-17 ENCOUNTER — Encounter: Payer: Self-pay | Admitting: Sports Medicine

## 2016-12-17 ENCOUNTER — Ambulatory Visit (INDEPENDENT_AMBULATORY_CARE_PROVIDER_SITE_OTHER): Payer: 59 | Admitting: Sports Medicine

## 2016-12-17 ENCOUNTER — Telehealth: Payer: Self-pay | Admitting: Emergency Medicine

## 2016-12-17 DIAGNOSIS — E538 Deficiency of other specified B group vitamins: Secondary | ICD-10-CM | POA: Diagnosis not present

## 2016-12-17 MED ORDER — CYANOCOBALAMIN 1000 MCG/ML IJ SOLN
1000.0000 ug | Freq: Once | INTRAMUSCULAR | Status: AC
  Administered 2016-12-17: 1000 ug via INTRAMUSCULAR

## 2016-12-17 MED ORDER — FOLIC ACID 1 MG PO TABS: 5.0000 mg | ORAL_TABLET | Freq: Every day | ORAL | 3 refills | Status: DC

## 2016-12-17 MED ORDER — VITAMIN D (ERGOCALCIFEROL) 1.25 MG (50000 UNIT) PO CAPS: 50000.0000 [IU] | ORAL_CAPSULE | ORAL | 0 refills | Status: DC

## 2016-12-17 NOTE — Progress Notes (Signed)
  Subjective:    CC: Fatigue  HPI: Sara Wolfe is doing well post sleeve gastrectomy.  She has lost a great deal of weight.  Unfortunately started complaining of vague and progressive fatigue.  She was seen in urgent care and tested, labs revealed low B12.  On further questioning she is stumbing, has memory loss, paresthesias in lower extremities.  Hasnt started B12 repletion yet.  Past medical history:  Negative.  See flowsheet/record as well for more information.  Surgical history: Negative.  See flowsheet/record as well for more information.  Family history: Negative.  See flowsheet/record as well for more information.  Social history: Negative.  See flowsheet/record as well for more information.  Allergies, and medications have been entered into the medical record, reviewed, and no changes needed.   Review of Systems: No fevers, chills, night sweats, weight loss, chest pain, or shortness of breath.   Objective:    General: Well Developed, well nourished, and in no acute distress.  Neuro: Alert and oriented x3, extra-ocular muscles intact, sensation grossly intact.  HEENT: Normocephalic, atraumatic, pupils equal round reactive to light, neck supple, no masses, no lymphadenopathy, thyroid nonpalpable.  Skin: Warm and dry, no rashes. Cardiac: Regular rate and rhythm, no murmurs rubs or gallops, no lower extremity edema.  Respiratory: Clear to auscultation bilaterally. Not using accessory muscles, speaking in full sentences.  Impression and Recommendations:    Vitamin B 12 deficiency Post gastric sleeve, vitamin B12 levels in the 100s, expected paresthesias, forgetfulness, fatigue. We will do vitamin B12 1000 mcg intramuscular weekly for 1 month, and then monthly.   I'd like to see her back in 1 month to recheck levels. Because of her new gastric anatomy she will be unable to absorb sufficient vitamin B12 orally. She also had low folate and vitamin D, these will be repleted as well  orally.  I spent 25 minutes with this patient, greater than 50% was face to face time counseling regarding the above and below diagnoses. ___________________________________________ Gwen Her. [name], M.D., ABFM., CAQSM. Primary Care and [place] Instructor of [place] [employer] [facility] [employer]

## 2016-12-17 NOTE — Telephone Encounter (Signed)
Pt has 3PM appointment with her PCP today at [place] her to keep appointment.  Dr. [name] should review most recent labs from pt's last UC visit to help guide her care.

## 2016-12-17 NOTE — Patient Instructions (Signed)
Vitamin B12 Deficiency Vitamin B12 deficiency occurs when the body does not have enough vitamin B12. Vitamin B12 is an important vitamin. The body needs vitamin B12:  To make red blood cells.  To make DNA. This is the genetic material inside cells.  To help the nerves work properly so they can carry messages from the brain to the body.  Vitamin B12 deficiency can cause various health problems, such as a low red blood cell count (anemia) or nerve damage. What are the causes? This condition may be caused by:  Not eating enough foods that contain vitamin B12.  Not having enough stomach acid and digestive fluids to properly absorb vitamin B12 from the food that you eat.  Certain digestive system diseases that make it hard to absorb vitamin B12. These diseases include Crohn disease, chronic pancreatitis, and cystic fibrosis.  Pernicious anemia. This is a condition in which the body does not make enough of a protein (intrinsic factor), resulting in too few red blood cells.  Having a surgery in which part of the stomach or small intestine is removed.  Taking certain medicines that make it hard for the body to absorb vitamin B12. These medicines include: ? Heartburn medicine (antacids and proton pump inhibitors). ? An antibiotic medicine called neomycin. ? Some medicines that are used to treat diabetes, tuberculosis, gout, or high cholesterol.  What increases the risk? The following factors may make you more likely to develop a B12 deficiency:  Being older than age 50.  Eating a vegetarian or vegan diet, especially while you are pregnant.  Eating a poor diet while you are pregnant.  Taking certain drugs.  Having alcoholism.  What are the signs or symptoms? In some cases, there are no symptoms of this condition. If the condition leads to anemia or nerve damage, various symptoms can occur, such as:  Weakness.  Fatigue.  Loss of appetite.  Weight loss.  Numbness or tingling  in your hands and feet.  Redness and burning of the tongue.  Confusion or memory problems.  Depression.  Sensory problems, such as color blindness, ringing in the ears, or loss of taste.  Diarrhea or constipation.  Trouble walking.  If anemia is severe, symptoms can include:  Shortness of breath.  Dizziness.  Rapid heart rate (tachycardia).  How is this diagnosed? This condition may be diagnosed with a blood test to measure the level of vitamin B12 in your blood. You may have other tests to help find the cause of your vitamin B12 deficiency. These tests may include:  A complete blood count (CBC). This is a group of tests that measure certain characteristics of blood cells.  A blood test to measure intrinsic factor.  An endoscopy. In this procedure, a thin tube with a camera on the end is used to look into your stomach or intestines.  How is this treated? Treatment for this condition depends on the cause. Common treatment options include:  Changing your eating and drinking habits, such as: ? Eating more foods that contain vitamin B12. ? Drinking less alcohol or no alcohol.  Taking vitamin B12 supplements. Your health care provider will tell you which dosage is best for you.  Getting vitamin B12 injections.  Follow these instructions at home:  Take supplements only as told by your health care provider. Follow the directions carefully.  Get any injections that are prescribed by your health care provider.  Do not miss your appointments.  Eat lots of healthy foods that contain vitamin B12.   Ask your health care provider if you should work with a dietitian. Foods that contain vitamin B12 include: ? Meat. ? Meat from birds (poultry). ? Fish. ? Eggs. ? Cereal and dairy products that are fortified. This means that vitamin B12 has been added to the food. Check the label on the package to see if the food is fortified.  Do not abuse alcohol.  Keep all follow-up visits as  told by your health care provider. This is important. Contact a health care provider if:  Your symptoms come back. Get help right away if:  You develop shortness of breath.  You have chest pain.  You become dizzy or you lose consciousness. This information is not intended to replace advice given to you by your health care provider. Make sure you discuss any questions you have with your health care provider. Document Released: 05/03/2011 Document Revised: 07/23/2015 Document Reviewed: 06/26/2014 Elsevier Interactive Patient Education  2018 Elsevier Inc.  

## 2016-12-17 NOTE — Assessment & Plan Note (Signed)
Post gastric sleeve, vitamin B12 levels in the 100s, expected paresthesias, forgetfulness, fatigue. We will do vitamin B12 1000 mcg intramuscular weekly for 1 month, and then monthly.   I'd like to see her back in 1 month to recheck levels. Because of her new gastric anatomy she will be unable to absorb sufficient vitamin B12 orally. She also had low folate and vitamin D, these will be repleted as well orally.

## 2016-12-23 ENCOUNTER — Telehealth: Payer: Self-pay

## 2016-12-23 DIAGNOSIS — G4733 Obstructive sleep apnea (adult) (pediatric): Secondary | ICD-10-CM | POA: Diagnosis not present

## 2016-12-23 NOTE — Telephone Encounter (Signed)
Pt left VM stating she is still very fatigued and would like to know if her work note can be extended? Please advise.

## 2016-12-23 NOTE — Telephone Encounter (Signed)
Thats not a problem but how long would she like?  It can take weeks to months for the fatigue to resolve when vitamin B12 deficiency is treated.

## 2016-12-24 ENCOUNTER — Encounter: Payer: Self-pay | Admitting: Sports Medicine

## 2016-12-24 ENCOUNTER — Ambulatory Visit (INDEPENDENT_AMBULATORY_CARE_PROVIDER_SITE_OTHER): Payer: 59 | Admitting: Sports Medicine

## 2016-12-24 VITALS — BP 104/54 | HR 67 | Wt 151.0 lb

## 2016-12-24 DIAGNOSIS — E538 Deficiency of other specified B group vitamins: Secondary | ICD-10-CM | POA: Diagnosis not present

## 2016-12-24 MED ORDER — CYANOCOBALAMIN 1000 MCG/ML IJ SOLN
1000.0000 ug | Freq: Once | INTRAMUSCULAR | Status: AC
  Administered 2016-12-24: 1000 ug via INTRAMUSCULAR

## 2016-12-24 NOTE — Telephone Encounter (Signed)
Pt would like to know if you will extend for 2 weeks?

## 2016-12-24 NOTE — Progress Notes (Signed)
   Subjective:    Patient ID: Sara Wolfe, female    DOB: 05-19-1960, 56 y.o.   MRN: [id number]  HPI  Eusebia Grulke is here for a vitamin B 12 injection. Denies muscle cramps, weakness or irregular heart rate.   Review of Systems     Objective:   Physical Exam        Assessment & Plan:  Patient tolerated injection well without complications. Patient advised to schedule next injection 7 days from today.

## 2016-12-24 NOTE — Telephone Encounter (Signed)
Letter in box. 

## 2016-12-27 ENCOUNTER — Other Ambulatory Visit (HOSPITAL_COMMUNITY): Payer: Self-pay | Admitting: Obstetrics & Gynecology

## 2016-12-27 DIAGNOSIS — Z1231 Encounter for screening mammogram for malignant neoplasm of breast: Secondary | ICD-10-CM

## 2016-12-31 ENCOUNTER — Ambulatory Visit (INDEPENDENT_AMBULATORY_CARE_PROVIDER_SITE_OTHER): Payer: 59 | Admitting: Sports Medicine

## 2016-12-31 VITALS — BP 109/71 | HR 58 | Wt 155.0 lb

## 2016-12-31 DIAGNOSIS — E538 Deficiency of other specified B group vitamins: Secondary | ICD-10-CM

## 2016-12-31 MED ORDER — CYANOCOBALAMIN 1000 MCG/ML IJ SOLN
1000.0000 ug | Freq: Once | INTRAMUSCULAR | Status: AC
  Administered 2016-12-31: 1000 ug via INTRAMUSCULAR

## 2016-12-31 NOTE — Progress Notes (Signed)
   Subjective:    Patient ID: Sara Wolfe, female    DOB: [birth year], 56 y.o.   MRN: [id number]  HPI  Sara Wolfe is here for a vitamin B 12 injection. Denies muscle cramps, weakness or irregular heart rate.   Review of Systems     Objective:   Physical Exam        Assessment & Plan:  Patient tolerated injection well without complications. Patient advised to schedule next injection 7 days from today.

## 2017-01-04 ENCOUNTER — Ambulatory Visit: Payer: 59 | Admitting: Sports Medicine

## 2017-01-04 ENCOUNTER — Ambulatory Visit (INDEPENDENT_AMBULATORY_CARE_PROVIDER_SITE_OTHER): Payer: 59 | Admitting: Obstetrics and Gynecology

## 2017-01-04 ENCOUNTER — Encounter: Payer: Self-pay | Admitting: Sports Medicine

## 2017-01-04 VITALS — BP 101/60 | HR 58 | Resp 16 | Ht 61.0 in | Wt 156.0 lb

## 2017-01-04 DIAGNOSIS — Z981 Arthrodesis status: Secondary | ICD-10-CM | POA: Diagnosis not present

## 2017-01-04 DIAGNOSIS — Z01419 Encounter for gynecological examination (general) (routine) without abnormal findings: Secondary | ICD-10-CM | POA: Diagnosis not present

## 2017-01-04 MED ORDER — ESTRADIOL 0.1 MG/GM VA CREA: TOPICAL_CREAM | VAGINAL | 12 refills | Status: DC

## 2017-01-04 MED ORDER — DULOXETINE HCL 30 MG PO CPEP: 30.0000 mg | ORAL_CAPSULE | Freq: Every day | ORAL | 3 refills | Status: DC

## 2017-01-04 MED ORDER — VENLAFAXINE HCL ER 37.5 MG PO CP24: ORAL_CAPSULE | ORAL | 0 refills | Status: DC

## 2017-01-04 NOTE — Progress Notes (Signed)
Subjective:     Sara Wolfe is a 56 y.o. female G2P2 with BMI 29 who d is here for a comprehensive physical exam. The patient reports no problems. Patient is sexually active. She denies any vaginal bleeding. She denies any pelvic pain or abnormal discharge. She reports some vaginal dryness with intercourse and does not like using KY jelly. Patient is without any other complaints  Past Medical History:  Diagnosis Date  . GERD (gastroesophageal reflux disease)   . Hot flashes, menopausal   . Medical history non-contributory   . PONV (postoperative nausea and vomiting)    Past Surgical History:  Procedure Laterality Date  . FOOT SURGERY    . GASTRIC BYPASS    . TUBAL LIGATION     No family history on file.  Social History   Socioeconomic History  . Marital status: Married    Spouse name: Not on file  . Number of children: Not on file  . Years of education: Not on file  . Highest education level: Not on file  Social Needs  . Financial resource strain: Not on file  . Food insecurity - worry: Not on file  . Food insecurity - inability: Not on file  . Transportation needs - medical: Not on file  . Transportation needs - non-medical: Not on file  Occupational History  . Not on file  Tobacco Use  . Smoking status: Light Tobacco Smoker    Packs/day: 0.25    Years: 2.00    Pack years: 0.50    Types: Cigarettes    Last attempt to quit: 04/02/2012    Years since quitting: 4.7  . Smokeless tobacco: Never Used  Substance and Sexual Activity  . Alcohol use: No  . Drug use: No  . Sexual activity: Yes    Birth control/protection: Post-menopausal  Other Topics Concern  . Not on file  Social History Narrative  . Not on file   Health Maintenance  Topic Date Due  . MAMMOGRAM  12/01/2017  . PAP SMEAR  12/02/2018  . TETANUS/TDAP  02/23/2020  . COLONOSCOPY  10/29/2025  . INFLUENZA VACCINE  Completed  . Hepatitis C Screening  Completed  . HIV Screening  Completed     Review of  Systems Pertinent items are noted in HPI.   Objective:  Blood pressure 101/60, pulse (!) 58, resp. rate 16, height 5\' 1"  (1.549 m), weight 156 lb (70.8 kg).     GENERAL: Well-developed, well-nourished female in no acute distress.  HEENT: Normocephalic, atraumatic. Sclerae anicteric.  NECK: Supple. Normal thyroid.  LUNGS: Clear to auscultation bilaterally.  HEART: Regular rate and rhythm. BREASTS: Symmetric in size. No palpable masses or lymphadenopathy, skin changes, or nipple drainage. ABDOMEN: Soft, nontender, nondistended. No organomegaly. PELVIC: Normal external female genitalia. Vagina is pink and rugated.  Normal discharge. Normal appearing cervix. Uterus is normal in size. No adnexal mass or tenderness. EXTREMITIES: No cyanosis, clubbing, or edema, 2+ distal pulses.    Assessment:    Healthy female exam.      Plan:    Patient declined pap smear Patient scheduled for mammogram on 11/16 Patient advised to continue breast and vulva monthly exam Rx estrace cream provided RTC prn See After Visit Summary for Counseling Recommendations

## 2017-01-04 NOTE — Progress Notes (Signed)
  Subjective:    CC: Shoulder and back pain  HPI: This is a pleasant 56 year old female, she has a history of a C4-T1 fusion, she has had on and off radicular pain around the right periscapular region, we did x-rays that showed solid fusion, she subsequently had an MRI with Dr. Lynann Bologna, supposedly this was negative.  Unfortunately continues to have paresthesias.  She is on Lyrica 200 mg twice a day, Effexor 150 daily.  Agreeable to switch to Cymbalta.  Past medical history:  Negative.  See flowsheet/record as well for more information.  Surgical history: Negative.  See flowsheet/record as well for more information.  Family history: Negative.  See flowsheet/record as well for more information.  Social history: Negative.  See flowsheet/record as well for more information.  Allergies, and medications have been entered into the medical record, reviewed, and no changes needed.   Review of Systems: No fevers, chills, night sweats, weight loss, chest pain, or shortness of breath.   Objective:    General: Well Developed, well nourished, and in no acute distress.  Neuro: Alert and oriented x3, extra-ocular muscles intact, sensation grossly intact.  HEENT: Normocephalic, atraumatic, pupils equal round reactive to light, neck supple, no masses, no lymphadenopathy, thyroid nonpalpable.  Skin: Warm and dry, no rashes. Cardiac: Regular rate and rhythm, no murmurs rubs or gallops, no lower extremity edema.  Respiratory: Clear to auscultation bilaterally. Not using accessory muscles, speaking in full sentences.  Impression and Recommendations:    S/P cervical spinal fusion Status post C4-T1 fusion, right periscapular radicular symptoms, x-rays were negative, per patient report she had a cervical spine MRI that was unrevealing, I have asked her to get me these results. She will continue with Lyrica 200 mg twice a day, I am going to add Cymbalta 30 mg daily, return in 1 month, if insufficient relief at  the follow-up visit we will do multiple trigger point injections as well as increase her Cymbalta to 60 mg. Because of this we are going to also do a down taper on Effexor, because Effexor has a very difficult down taper regimen we are going to do 75 mg for a week then 37.5 mg for a week then 37.5 mg every other day and then stop.  I spent 25 minutes with this patient, greater than 50% was face-to-face time counseling regarding the above diagnoses ___________________________________________ Gwen Her. [name], M.D., ABFM., CAQSM. Primary Care and [place] Instructor of [place] [facility] [employer]

## 2017-01-04 NOTE — Assessment & Plan Note (Signed)
Status post C4-T1 fusion, right periscapular radicular symptoms, x-rays were negative, per patient report she had a cervical spine MRI that was unrevealing, I have asked her to get me these results. She will continue with Lyrica 200 mg twice a day, I am going to add Cymbalta 30 mg daily, return in 1 month, if insufficient relief at the follow-up visit we will do multiple trigger point injections as well as increase her Cymbalta to 60 mg. Because of this we are going to also do a down taper on Effexor, because Effexor has a very difficult down taper regimen we are going to do 75 mg for a week then 37.5 mg for a week then 37.5 mg every other day and then stop.

## 2017-01-06 ENCOUNTER — Ambulatory Visit: Payer: Self-pay

## 2017-01-06 ENCOUNTER — Ambulatory Visit: Payer: Self-pay | Admitting: Obstetrics & Gynecology

## 2017-01-07 ENCOUNTER — Ambulatory Visit (INDEPENDENT_AMBULATORY_CARE_PROVIDER_SITE_OTHER): Payer: 59

## 2017-01-07 ENCOUNTER — Encounter: Payer: Self-pay | Admitting: Sports Medicine

## 2017-01-07 ENCOUNTER — Ambulatory Visit (INDEPENDENT_AMBULATORY_CARE_PROVIDER_SITE_OTHER): Payer: 59 | Admitting: Sports Medicine

## 2017-01-07 ENCOUNTER — Ambulatory Visit: Payer: Self-pay

## 2017-01-07 VITALS — BP 111/67 | HR 65 | Resp 16 | Wt 154.0 lb

## 2017-01-07 DIAGNOSIS — E538 Deficiency of other specified B group vitamins: Secondary | ICD-10-CM

## 2017-01-07 DIAGNOSIS — M542 Cervicalgia: Secondary | ICD-10-CM | POA: Diagnosis not present

## 2017-01-07 DIAGNOSIS — Z1231 Encounter for screening mammogram for malignant neoplasm of breast: Secondary | ICD-10-CM

## 2017-01-07 DIAGNOSIS — Z981 Arthrodesis status: Secondary | ICD-10-CM | POA: Diagnosis not present

## 2017-01-07 MED ORDER — CYANOCOBALAMIN 1000 MCG/ML IJ SOLN
1000.0000 ug | Freq: Once | INTRAMUSCULAR | Status: AC
  Administered 2017-01-07: 1000 ug via INTRAMUSCULAR

## 2017-01-07 NOTE — Progress Notes (Signed)
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  At the last visit we asked her to give [place] copies of her MRI, it was a thoracic spine MRI from an outside facility, she has not had a repeat cervical spine MRI since her surgery.  This will be ordered today.  In addition I added a down taper on Effexor and added Cymbalta, she is doing well on this.  She really more needs a work note today.  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  Past medical history:  Negative.  See flowsheet/record as well for more information.  Surgical history: Negative.  See flowsheet/record as well for more information.  Family history: Negative.  See flowsheet/record as well for more information.  Social history: Negative.  See flowsheet/record as well for more information.  Allergies, and medications have been entered into the medical record, reviewed, and no changes needed.   Review of Systems: No fevers, chills, night sweats, weight loss, chest pain, or shortness of breath.   Objective:    General: Well Developed, well nourished, and in no acute distress.  Neuro: Alert and oriented x3, extra-ocular muscles intact, sensation grossly intact.  HEENT: Normocephalic, atraumatic, pupils equal round reactive to light, neck supple, no masses, no lymphadenopathy, thyroid nonpalpable.  Skin: Warm and dry, no rashes. Cardiac: Regular rate and rhythm, no murmurs rubs or gallops, no lower extremity edema.  Respiratory: Clear to auscultation bilaterally. Not using accessory muscles, speaking in full sentences. Neck: Negative spurling's Expected limitations in range of motion to flexion, extension, side bending, good rotation. Grip strength and sensation normal in bilateral hands Strength  good C4 to T1 distribution No sensory change to C4 to T1 Reflexes normal tender to palpation across the medial periscapular muscles on the right including the trapezius, levator scapulae, as well as rhomboideus minor and major.  Procedure:  Injection of #2 right periscapular trigger points Consent obtained and verified. Time-out conducted. Noted no overlying erythema, induration, or other signs of local infection. Skin prepped in a sterile fashion. Topical analgesic spray: Ethyl chloride. Completed without difficulty. Meds: I injected a total of 1 cc kenalog 40, 2 cc lidocaine, 2 cc bupivacaine spread out between 2 trigger points on the rhomboid major and minor immediately parascapular. Pain immediately improved suggesting accurate placement of the medication. Advised to call if fevers/chills, erythema, induration, drainage, or persistent bleeding.  Impression and Recommendations:    S/P cervical spinal fusion Jeanice is back somewhat early, she needs a note keeping her out of work until not this coming Monday but the Monday afterwards. She is post C4-T1 fusion with right periscapular radicular pain. She did have a thoracic spine MRI but no cervical spine MRI so we are going to order this with contrast, she will again continue Lyrica 200 mg twice a day, I have down tapered her from Effexor and she is starting Cymbalta, I am going to do multiple trigger point injections today along the painful area. I would like to still see her back in about a month. I am ordering the cervical spine MRI with contrast as we are going to probably need to do a cervical epidural.  ___________________________________________ Gwen Her. [name], M.D., ABFM., CAQSM. Primary Care and [place] Instructor of [place]  of [employer] of Medicine

## 2017-01-07 NOTE — Assessment & Plan Note (Signed)
Sara Wolfe is back somewhat early, she needs a note keeping her out of work until not this coming Monday but the Monday afterwards. She is post C4-T1 fusion with right periscapular radicular pain. She did have a thoracic spine MRI but no cervical spine MRI so we are going to order this with contrast, she will again continue Lyrica 200 mg twice a day, I have down tapered her from Effexor and she is starting Cymbalta, I am going to do multiple trigger point injections today along the painful area. I would like to still see her back in about a month. I am ordering the cervical spine MRI with contrast as we are going to probably need to do a cervical epidural.

## 2017-01-17 ENCOUNTER — Encounter: Payer: Self-pay | Admitting: Sports Medicine

## 2017-01-17 ENCOUNTER — Ambulatory Visit (INDEPENDENT_AMBULATORY_CARE_PROVIDER_SITE_OTHER): Payer: 59 | Admitting: Sports Medicine

## 2017-01-17 VITALS — BP 115/75 | HR 64 | Resp 18 | Wt 157.0 lb

## 2017-01-17 DIAGNOSIS — G894 Chronic pain syndrome: Secondary | ICD-10-CM

## 2017-01-17 DIAGNOSIS — E538 Deficiency of other specified B group vitamins: Secondary | ICD-10-CM

## 2017-01-17 DIAGNOSIS — Z981 Arthrodesis status: Secondary | ICD-10-CM | POA: Diagnosis not present

## 2017-01-17 DIAGNOSIS — R471 Dysarthria and anarthria: Secondary | ICD-10-CM | POA: Insufficient documentation

## 2017-01-17 DIAGNOSIS — R51 Headache: Secondary | ICD-10-CM

## 2017-01-17 DIAGNOSIS — R29818 Other symptoms and signs involving the nervous system: Secondary | ICD-10-CM

## 2017-01-17 DIAGNOSIS — R519 Headache, unspecified: Secondary | ICD-10-CM

## 2017-01-17 MED ORDER — DULOXETINE HCL 60 MG PO CPEP: 60.0000 mg | ORAL_CAPSULE | Freq: Every day | ORAL | 3 refills | Status: DC

## 2017-01-17 MED ORDER — PREGABALIN 200 MG PO CAPS: 200.0000 mg | ORAL_CAPSULE | Freq: Two times a day (BID) | ORAL | 3 refills | Status: DC

## 2017-01-17 MED ORDER — TRAMADOL-ACETAMINOPHEN 37.5-325 MG PO TABS: 1.0000 | ORAL_TABLET | Freq: Two times a day (BID) | ORAL | 3 refills | Status: DC | PRN

## 2017-01-17 MED ORDER — METHOCARBAMOL 500 MG PO TABS: 500.0000 mg | ORAL_TABLET | Freq: Three times a day (TID) | ORAL | 3 refills | Status: DC

## 2017-01-17 MED ORDER — CYANOCOBALAMIN 1000 MCG/ML IJ SOLN
1000.0000 ug | Freq: Once | INTRAMUSCULAR | Status: AC
  Administered 2017-01-17: 1000 ug via INTRAMUSCULAR

## 2017-01-17 NOTE — Assessment & Plan Note (Signed)
Status post C4-T1 fusion with right periscapular radicular pain, trigger point injections were only minimally efficacious. Awaiting cervical spine MRI. I do anticipate cervical epidural on the right. Continue Lyrica 200 twice a day, we started his Cymbalta, increasing to 60 mg daily. Refilling Robaxin, Ultracet. I will keep her out of work for another 2 weeks until we can get the MRI and the epidural.

## 2017-01-17 NOTE — Progress Notes (Addendum)
  Subjective:    CC: Follow-up multiple issues  HPI: Cervical radiculitis: Right periscapular, history of a C4-T1 fusion, we are awaiting new cervical spine MRI for interventional planning.  Trigger point injections were ineffective at the last visit.  She needs refills on Lyrica, Ultracet, Robaxin, would like to go up on her Cymbalta, understands is only been 2-1/2 weeks.  Vitamin B12 deficiency: Confirmed on lab work, did have some peripheral neuropathy, post gastric sleeve.  Has done much better since we started B12 parenteral injections.  Unfortunately is having episodes of word finding difficulty, as well as severe headaches.  Past medical history:  Negative.  See flowsheet/record as well for more information.  Surgical history: Negative.  See flowsheet/record as well for more information.  Family history: Negative.  See flowsheet/record as well for more information.  Social history: Negative.  See flowsheet/record as well for more information.  Allergies, and medications have been entered into the medical record, reviewed, and no changes needed.   Review of Systems: No fevers, chills, night sweats, weight loss, chest pain, or shortness of breath.   Objective:    General: Well Developed, well nourished, and in no acute distress.  Neuro: Alert and oriented x3, extra-ocular muscles intact, sensation grossly intact.  Cranial nerves II through XII are intact, motor, sensory, coordinative functions are intact for the most part with the exceptions of intermittent word finding difficulties. HEENT: Normocephalic, atraumatic, pupils equal round reactive to light, neck supple, no masses, no lymphadenopathy, thyroid nonpalpable.  Skin: Warm and dry, no rashes. Cardiac: Regular rate and rhythm, no murmurs rubs or gallops, no lower extremity edema.  Respiratory: Clear to auscultation bilaterally. Not using accessory muscles, speaking in full sentences.  Impression and Recommendations:    S/P  cervical spinal fusion Status post C4-T1 fusion with right periscapular radicular pain, trigger point injections were only minimally efficacious. Awaiting cervical spine MRI. I do anticipate cervical epidural on the right. Continue Lyrica 200 twice a day, we started his Cymbalta, increasing to 60 mg daily. Refilling Robaxin, Ultracet. I will keep her out of work for another 2 weeks until we can get the MRI and the epidural.  Vitamin B 12 deficiency B12 injection today and then monthly afterwards. She is post sleeve gastrectomy and will probably not be able to absorb her B12 orally.   Dysarthria Sure as to whether this is secondary to her B12 deficiency or a focal lesion in the brain. Adding a brain MRI with and without contrast.  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 I would like neurology to weigh in.  I spent 40 minutes with this patient, greater than 50% was face-to-face time counseling regarding the above diagnoses ___________________________________________ Gwen Her. [name], M.D., ABFM., CAQSM. Primary Care and [place] Instructor of [place] of [facility] of [facility]

## 2017-01-17 NOTE — Assessment & Plan Note (Signed)
B12 injection today and then monthly afterwards. She is post sleeve gastrectomy and will probably not be able to absorb her B12 orally.

## 2017-01-17 NOTE — Assessment & Plan Note (Addendum)
Sure as to whether this is secondary to her B12 deficiency or a focal lesion in the brain. Adding a brain MRI with and without contrast.  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 I would like neurology to weigh in.

## 2017-01-20 DIAGNOSIS — B079 Viral wart, unspecified: Secondary | ICD-10-CM | POA: Diagnosis not present

## 2017-01-20 DIAGNOSIS — M21612 Bunion of left foot: Secondary | ICD-10-CM | POA: Diagnosis not present

## 2017-01-20 DIAGNOSIS — M79672 Pain in left foot: Secondary | ICD-10-CM | POA: Diagnosis not present

## 2017-01-22 ENCOUNTER — Other Ambulatory Visit: Payer: Self-pay | Admitting: Sports Medicine

## 2017-01-22 DIAGNOSIS — Z981 Arthrodesis status: Secondary | ICD-10-CM

## 2017-01-24 ENCOUNTER — Ambulatory Visit (INDEPENDENT_AMBULATORY_CARE_PROVIDER_SITE_OTHER): Payer: 59

## 2017-01-24 DIAGNOSIS — R51 Headache: Secondary | ICD-10-CM

## 2017-01-24 DIAGNOSIS — R29818 Other symptoms and signs involving the nervous system: Secondary | ICD-10-CM

## 2017-01-24 DIAGNOSIS — R471 Dysarthria and anarthria: Secondary | ICD-10-CM

## 2017-01-24 DIAGNOSIS — R519 Headache, unspecified: Secondary | ICD-10-CM

## 2017-01-24 DIAGNOSIS — Z981 Arthrodesis status: Secondary | ICD-10-CM

## 2017-01-24 DIAGNOSIS — M542 Cervicalgia: Secondary | ICD-10-CM | POA: Diagnosis not present

## 2017-01-24 DIAGNOSIS — M4802 Spinal stenosis, cervical region: Secondary | ICD-10-CM

## 2017-01-24 MED ORDER — GADOBENATE DIMEGLUMINE 529 MG/ML IV SOLN
14.0000 mL | Freq: Once | INTRAVENOUS | Status: AC | PRN
  Administered 2017-01-24: 14 mL via INTRAVENOUS

## 2017-01-27 ENCOUNTER — Telehealth: Payer: Self-pay | Admitting: Sports Medicine

## 2017-01-27 ENCOUNTER — Encounter: Payer: Self-pay | Admitting: Sports Medicine

## 2017-01-27 NOTE — Telephone Encounter (Signed)
Patient came in requesting a note to be released back to work on December 17th. She stated she needs this asap due to the impending weather. I will fax the letter to her employer once you get it completed. Thanks so much!

## 2017-01-27 NOTE — Progress Notes (Signed)
Letter in box. 

## 2017-01-27 NOTE — Addendum Note (Signed)
Addended by: Silverio Decamp on: 01/27/2017 12:52 PM   Modules accepted: Orders

## 2017-02-01 ENCOUNTER — Telehealth: Payer: Self-pay | Admitting: *Deleted

## 2017-02-01 ENCOUNTER — Ambulatory Visit: Payer: Self-pay | Admitting: Sports Medicine

## 2017-02-01 NOTE — Telephone Encounter (Signed)
LVM on home number letting her know appt cx tomorrow d/t inclement weather, office closed. Asked her to call back tomorrow afternoon to r/s her appt. Gave [facility] phone number.   I called cell number. Spoke with pt. Advised I also left message on home number, disregard. I r/s her appt for tomorrow to 02/07/17 at 9am, check in 830am. She verbalized understanding.

## 2017-02-02 ENCOUNTER — Ambulatory Visit: Payer: Self-pay | Admitting: Neurology

## 2017-02-04 NOTE — Telephone Encounter (Signed)
I will bring you the letter.

## 2017-02-04 NOTE — Telephone Encounter (Signed)
Letter faxed to ATTN: Dawn at [phone].

## 2017-02-04 NOTE — Telephone Encounter (Signed)
Pt left VM to have work note extended until Jan 2nd. Please assist.

## 2017-02-06 ENCOUNTER — Other Ambulatory Visit: Payer: Self-pay | Admitting: Sports Medicine

## 2017-02-06 DIAGNOSIS — E538 Deficiency of other specified B group vitamins: Secondary | ICD-10-CM

## 2017-02-07 ENCOUNTER — Ambulatory Visit: Payer: 59 | Admitting: Neurology

## 2017-02-07 ENCOUNTER — Encounter: Payer: Self-pay | Admitting: Neurology

## 2017-02-07 VITALS — BP 101/70 | HR 60 | Ht 62.0 in | Wt 154.0 lb

## 2017-02-07 DIAGNOSIS — R413 Other amnesia: Secondary | ICD-10-CM | POA: Diagnosis not present

## 2017-02-07 NOTE — Patient Instructions (Signed)
   REduce the topamax to 50 mg at night.

## 2017-02-07 NOTE — Progress Notes (Signed)
Reason for visit: Memory disturbance, word finding problems  Referring physician: Dr. Renelda Mom Sara Wolfe is a 56 y.o. female  History of present illness:  Ms. Sara Wolfe is a 56 year old right-handed white female with a history of a fibromyalgia type syndrome and migraine headaches.  The patient has been placed on Lyrica and Topamax.  The doses of both these medications have been increased recently, the patient went from 150 to 200 mg twice daily with the Lyrica within the last 2 months, and the Topamax was increased from 50 mg to 100 mg at night.  Around that time she began having some problems with word finding issues, difficulty with directions with driving.  The patient has also noted that when driving she seems to swerve in and out of the lane, she has difficulty staying in her own lane.  She has been noted to have a vitamin B12 deficiency, she has a prior history of a gastric bypass procedure.  The patient has been on B12 supplementation, she feels better in general but her word finding problems have not improved.  The patient does note some slight gait instability, she denies any numbness or weakness of the extremities.  She denies issues controlling the bowels or the bladder.  She denies any problems keeping up with finances or keeping up with medications or appointments.  She is sent to this office for an evaluation.  MRI of the brain has been done, this was reviewed online and appears to be essentially normal.  Past Medical History:  Diagnosis Date  . GERD (gastroesophageal reflux disease)   . Hot flashes, menopausal   . Medical history non-contributory   . PONV (postoperative nausea and vomiting)     Past Surgical History:  Procedure Laterality Date  . ANTERIOR CERVICAL DECOMP/DISCECTOMY FUSION N/A 10/04/2013   Procedure: ANTERIOR CERVICAL DECOMPRESSION/DISCECTOMY FUSION 3 LEVELS;  Surgeon: Sinclair Ship, MD;  Location: [facility];  Service: Orthopedics;  Laterality: N/A;   Anterior cervical decompression fusion cervical 4-5, cervical 5-6, cervical 6-7 with instrumentation and allograft  . ANTERIOR CERVICAL DECOMP/DISCECTOMY FUSION N/A 04/11/2014   Procedure: ANTERIOR CERVICAL DECOMPRESSION/DISCECTOMY FUSION 1 LEVEL;  Surgeon: Sinclair Ship, MD;  Location: [facility];  Service: Orthopedics;  Laterality: N/A;  Anterior cervical decompression fusion, cervical 7-thoracic 1 with instrumentation and allograft  . FOOT SURGERY    . GASTRIC BYPASS    . TUBAL LIGATION      Family History  Problem Relation Age of Onset  . Colon cancer Sister     Social history:  reports that she has been smoking cigarettes.  She has a 0.50 pack-year smoking history. she has never used smokeless tobacco. She reports that she does not drink alcohol or use drugs.  Medications:  Prior to Admission medications   Medication Sig Start Date End Date Taking? Authorizing Provider  AMBULATORY NON FORMULARY MEDICATION Continuous positive airway pressure (CPAP) machine autopap 5-21 cm of H2O pressure (or autoPAP if available), with all supplemental supplies as needed.  To [employer]. AHI=15 12/16/16  Yes Silverio Decamp, MD  cyanocobalamin (,VITAMIN B-12,) 1000 MCG/ML injection Inject 1000 mcg weekly intramuscular for 1 month then monthly thereafter 12/17/16  Yes Silverio Decamp, MD  DULoxetine (CYMBALTA) 60 MG capsule Take 1 capsule (60 mg total) by mouth daily. 01/17/17  Yes Silverio Decamp, MD  estradiol (ESTRACE) 0.1 MG/GM vaginal cream Apply 1 gram per vagina every night for 2 weeks, then apply three times a week 01/04/17  Yes Constant,  Peggy, MD  folic acid (FOLVITE) 1 MG tablet Take 5 tablets (5 mg total) by mouth daily. 12/17/16  Yes Silverio Decamp, MD  methocarbamol (ROBAXIN) 500 MG tablet Take 1 tablet (500 mg total) by mouth 3 (three) times daily. 01/17/17  Yes Silverio Decamp, MD  pregabalin (LYRICA) 200 MG capsule Take 1 capsule (200 mg total) by mouth 2  (two) times daily. 01/17/17  Yes Silverio Decamp, MD  rizatriptan (MAXALT-MLT) 10 MG disintegrating tablet TAKE 1 TABLET (10 MG TOTAL) BY MOUTH AS NEEDED FOR MIGRAINE. MAY REPEAT IN 2 HOURS IF NEEDED 02/21/16  Yes Silverio Decamp, MD  topiramate (TOPAMAX) 100 MG tablet Take 1 tablet (100 mg total) by mouth at bedtime. 11/29/16  Yes Silverio Decamp, MD  traMADol-acetaminophen (ULTRACET) 37.5-325 MG tablet Take 1 tablet by mouth 2 (two) times daily as needed. 01/17/17  Yes Silverio Decamp, MD      Allergies  Allergen Reactions  . Augmentin [Amoxicillin-Pot Clavulanate] Diarrhea    ROS:  Out of a complete 14 system review of symptoms, the patient complains only of the following symptoms, and all other reviewed systems are negative.  Palpitations of the heart Ringing in the ears Snoring Feeling cold Allergies, runny nose Confusion, headache, numbness, dizziness  Blood pressure 101/70, pulse 60, height 5\' 2"  (1.575 m), weight 154 lb (69.9 kg).  Physical Exam  General: The patient is alert and cooperative at the time of the examination.  Eyes: Pupils are equal, round, and reactive to light. Discs are flat bilaterally.  Neck: The neck is supple, no carotid bruits are noted.  Respiratory: The respiratory examination is clear.  Cardiovascular: The cardiovascular examination reveals a regular rate and rhythm, no obvious murmurs or rubs are noted.  Skin: Extremities are without significant edema.  Neurologic Exam  Mental status: The patient is alert and oriented x 3 at the time of the examination. The patient has apparent normal recent and remote memory, with an apparently normal attention span and concentration ability.  Mini-Mental status examination done today shows a total score of 30/30.  Cranial nerves: Facial symmetry is present. There is good sensation of the face to pinprick and soft touch bilaterally. The strength of the facial muscles and the  muscles to head turning and shoulder shrug are normal bilaterally. Speech is well enunciated, no aphasia or dysarthria is noted. Extraocular movements are full. Visual fields are full. The tongue is midline, and the patient has symmetric elevation of the soft palate. No obvious hearing deficits are noted.  Motor: The motor testing reveals 5 over 5 strength of all 4 extremities. Good symmetric motor tone is noted throughout.  Sensory: Sensory testing is intact to pinprick, soft touch, vibration sensation, and position sense on all 4 extremities. No evidence of extinction is noted.  Coordination: Cerebellar testing reveals good finger-nose-finger and heel-to-shin bilaterally.  Gait and station: Gait is normal. Tandem gait is normal. Romberg is negative. No drift is seen.  Reflexes: Deep tendon reflexes are symmetric and normal bilaterally. Toes are downgoing bilaterally.    MR brain 01/24/17:  IMPRESSION: 1. Largely unremarkable brain MRI. No cause of the patient's symptoms identified. 2. Possible single chronic microhemorrhage in the left cerebral white matter.  * MRI scan images were reviewed online. I agree with the written report.    Assessment/Plan:  1.  Reported memory disturbance, word finding problems  The patient has gone up on several medications including Lyrica and Topamax.  Topamax in particular may  cause difficulty with cognitive processing and word finding problems.  This is the likely source of her cognitive issues reported above.  We will reduce the dose to 50 mg at night.  The patient will follow-up in 6 months.  The patient will contact our office if she believes that her cognitive issues are worsening over time.  The Lyrica may be the etiology of the mild gait instability and difficulty with driving.  Jill Alexanders MD 02/07/2017 9:17 AM  [facility] [address] [place], [place] [address]  Phone [phone] Fax  [phone]

## 2017-02-11 ENCOUNTER — Ambulatory Visit (INDEPENDENT_AMBULATORY_CARE_PROVIDER_SITE_OTHER): Payer: 59 | Admitting: Sports Medicine

## 2017-02-11 VITALS — BP 114/76 | HR 65 | Resp 16 | Wt 154.0 lb

## 2017-02-11 DIAGNOSIS — Z981 Arthrodesis status: Secondary | ICD-10-CM | POA: Diagnosis not present

## 2017-02-11 DIAGNOSIS — E538 Deficiency of other specified B group vitamins: Secondary | ICD-10-CM

## 2017-02-11 DIAGNOSIS — R413 Other amnesia: Secondary | ICD-10-CM | POA: Diagnosis not present

## 2017-02-11 DIAGNOSIS — M5416 Radiculopathy, lumbar region: Secondary | ICD-10-CM | POA: Diagnosis not present

## 2017-02-11 MED ORDER — CYANOCOBALAMIN 1000 MCG/ML IJ SOLN
1000.0000 ug | Freq: Once | INTRAMUSCULAR | Status: AC
  Administered 2017-02-11: 1000 ug via INTRAMUSCULAR

## 2017-02-11 MED ORDER — ACETAMINOPHEN ER 650 MG PO TBCR: 650.0000 mg | EXTENDED_RELEASE_TABLET | Freq: Three times a day (TID) | ORAL | 3 refills | Status: DC | PRN

## 2017-02-11 MED ORDER — TRAMADOL HCL 50 MG PO TABS: ORAL_TABLET | ORAL | 0 refills | Status: DC

## 2017-02-11 NOTE — Assessment & Plan Note (Signed)
There are some degenerative changes of the bottom of the fusion construct. Continue Lyrica, Cymbalta, Robaxin. Epidural on the right side at C7-T1 if able. Return 1 month after epidural to evaluate response.

## 2017-02-11 NOTE — Assessment & Plan Note (Signed)
Switching from Ultracet to tramadol 3 times daily and arthritis strength Tylenol 3 times daily.

## 2017-02-11 NOTE — Progress Notes (Signed)
  Subjective:    CC: Multiple issues  HPI: Memory loss: Resolved with discontinuation of Topamax  Neck pain: Post multilevel cervical fusion, has not yet heard back regarding her cervical epidural  Left lumbar radiculitis: Minimal effect from Ultracet, wondering if we can go up on the dose.  Past medical history:  Negative.  See flowsheet/record as well for more information.  Surgical history: Negative.  See flowsheet/record as well for more information.  Family history: Negative.  See flowsheet/record as well for more information.  Social history: Negative.  See flowsheet/record as well for more information.  Allergies, and medications have been entered into the medical record, reviewed, and no changes needed.   (To billers/coders, pertinent past medical, social, surgical, family history can be found in problem list, if problem list is marked as reviewed then this indicates that past medical, social, surgical, family history was also reviewed)  Review of Systems: No fevers, chills, night sweats, weight loss, chest pain, or shortness of breath.   Objective:    General: Well Developed, well nourished, and in no acute distress.  Neuro: Alert and oriented x3, extra-ocular muscles intact, sensation grossly intact.  HEENT: Normocephalic, atraumatic, pupils equal round reactive to light, neck supple, no masses, no lymphadenopathy, thyroid nonpalpable.  Skin: Warm and dry, no rashes. Cardiac: Regular rate and rhythm, no murmurs rubs or gallops, no lower extremity edema.  Respiratory: Clear to auscultation bilaterally. Not using accessory muscles, speaking in full sentences.  Impression and Recommendations:    S/P cervical spinal fusion There are some degenerative changes of the bottom of the fusion construct. Continue Lyrica, Cymbalta, Robaxin. Epidural on the right side at C7-T1 if able. Return 1 month after epidural to evaluate response.  Memory change Improvement in word finding  difficulties with discontinuation of Topamax. Brain MRI was negative.  Left lumbar radiculitis Switching from Ultracet to tramadol 3 times daily and arthritis strength Tylenol 3 times daily.  ___________________________________________ Gwen Her. [name], M.D., ABFM., CAQSM. Primary Care and [place] Instructor of [place] of [facility] of Medicine

## 2017-02-11 NOTE — Assessment & Plan Note (Signed)
Improvement in word finding difficulties with discontinuation of Topamax. Brain MRI was negative.

## 2017-02-18 ENCOUNTER — Telehealth: Payer: Self-pay

## 2017-02-18 NOTE — Telephone Encounter (Signed)
Sara Wolfe called and states she needs her work note extended to the 45 th of January. Please advise.

## 2017-02-21 ENCOUNTER — Other Ambulatory Visit: Payer: Self-pay | Admitting: Sports Medicine

## 2017-02-21 DIAGNOSIS — E538 Deficiency of other specified B group vitamins: Secondary | ICD-10-CM

## 2017-02-21 NOTE — Telephone Encounter (Signed)
Done

## 2017-02-21 NOTE — Telephone Encounter (Signed)
[address] for note. Does she need until 7th or 14th?

## 2017-02-21 NOTE — Telephone Encounter (Signed)
Appointment changed to this Friday instead of the 11 th. So she can return earlier. Letter on you desk.

## 2017-02-21 NOTE — Telephone Encounter (Signed)
Dr. Darene Lamer should be back on the 7th and he can make the call.  When does current work note run out now?

## 2017-02-21 NOTE — Telephone Encounter (Signed)
She needs the note today. She needs to be out until the 7 th. The appointment for her injection appointment changed to Friday.

## 2017-02-21 NOTE — Telephone Encounter (Signed)
Patient needs to extend her out of work note until after the neck injection. Until January 14 th.

## 2017-02-25 ENCOUNTER — Ambulatory Visit
Admission: RE | Admit: 2017-02-25 | Discharge: 2017-02-25 | Disposition: A | Discharge status: Home / Self Care | Payer: 59 | Source: Ambulatory Visit | Attending: Sports Medicine | Admitting: Sports Medicine

## 2017-02-25 ENCOUNTER — Telehealth: Payer: Self-pay | Admitting: Sports Medicine

## 2017-02-25 DIAGNOSIS — M47813 Spondylosis without myelopathy or radiculopathy, cervicothoracic region: Secondary | ICD-10-CM | POA: Diagnosis not present

## 2017-02-25 DIAGNOSIS — Z9884 Bariatric surgery status: Secondary | ICD-10-CM | POA: Diagnosis not present

## 2017-02-25 DIAGNOSIS — K912 Postsurgical malabsorption, not elsewhere classified: Secondary | ICD-10-CM | POA: Diagnosis not present

## 2017-02-25 MED ORDER — TRIAMCINOLONE ACETONIDE 40 MG/ML IJ SUSP (RADIOLOGY)
60.0000 mg | Freq: Once | INTRAMUSCULAR | Status: AC
  Administered 2017-02-25: 60 mg via EPIDURAL

## 2017-02-25 MED ORDER — IOPAMIDOL (ISOVUE-M 300) INJECTION 61%
1.0000 mL | Freq: Once | INTRAMUSCULAR | Status: AC | PRN
  Administered 2017-02-25: 1 mL via EPIDURAL

## 2017-02-25 NOTE — Telephone Encounter (Signed)
Pt called. She is going back to work on Monday and she needs a Work Note before 3:00.

## 2017-02-25 NOTE — Discharge Instructions (Signed)

## 2017-02-28 NOTE — Telephone Encounter (Signed)
Pt advised.

## 2017-02-28 NOTE — Telephone Encounter (Signed)
Letter in my box, please remind her that we need at least 24 hours to respond to messages.  It also looks like Luvenia Starch took care of this last week.

## 2017-03-01 NOTE — Telephone Encounter (Signed)
Thank you :)

## 2017-03-02 ENCOUNTER — Other Ambulatory Visit: Payer: Self-pay

## 2017-03-02 DIAGNOSIS — Z981 Arthrodesis status: Secondary | ICD-10-CM

## 2017-03-02 MED ORDER — DULOXETINE HCL 60 MG PO CPEP: 60.0000 mg | ORAL_CAPSULE | Freq: Every day | ORAL | 0 refills | Status: DC

## 2017-03-08 ENCOUNTER — Telehealth: Payer: Self-pay

## 2017-03-08 MED ORDER — GABAPENTIN 800 MG PO TABS: 800.0000 mg | ORAL_TABLET | Freq: Three times a day (TID) | ORAL | 3 refills | Status: DC

## 2017-03-08 NOTE — Telephone Encounter (Signed)
Short course of gabapentin called in, call back mid February to get Lyrica refilled.

## 2017-03-08 NOTE — Telephone Encounter (Signed)
Pt left VM asking for a refill of Lyrica. Checked her chart and called pharmacy and they stated she can't get a refill until 03/26/17. Called pt to let her know and she stated that she lost the rx after having it filled and now she's completely out. Pt would like to know if she can get some gabapentin or something for the pain since she's out of Lyrica. Please advise.

## 2017-03-09 ENCOUNTER — Ambulatory Visit (INDEPENDENT_AMBULATORY_CARE_PROVIDER_SITE_OTHER): Payer: 59 | Admitting: Physician Assistant

## 2017-03-09 VITALS — BP 121/68 | HR 61

## 2017-03-09 DIAGNOSIS — Z9884 Bariatric surgery status: Secondary | ICD-10-CM | POA: Diagnosis not present

## 2017-03-09 DIAGNOSIS — G4730 Sleep apnea, unspecified: Secondary | ICD-10-CM | POA: Diagnosis not present

## 2017-03-09 DIAGNOSIS — K912 Postsurgical malabsorption, not elsewhere classified: Secondary | ICD-10-CM | POA: Diagnosis not present

## 2017-03-09 DIAGNOSIS — E538 Deficiency of other specified B group vitamins: Secondary | ICD-10-CM | POA: Diagnosis not present

## 2017-03-09 MED ORDER — CYANOCOBALAMIN 1000 MCG/ML IJ SOLN
1000.0000 ug | Freq: Once | INTRAMUSCULAR | Status: AC
  Administered 2017-03-09: 1000 ug via INTRAMUSCULAR

## 2017-03-09 NOTE — Progress Notes (Signed)
Pt came into clinic today for her vitamin B12 injection. Denies muscle cramps, weakness or irregular heart rate. Denies any complications associated with the injection. Pt tolerated injection in her left deltoid well, no immediate complications. Advised to return in one month for next shot, verbalized understanding.

## 2017-03-11 ENCOUNTER — Ambulatory Visit: Payer: Self-pay

## 2017-03-26 ENCOUNTER — Other Ambulatory Visit: Payer: Self-pay | Admitting: Sports Medicine

## 2017-03-26 DIAGNOSIS — G43809 Other migraine, not intractable, without status migrainosus: Secondary | ICD-10-CM

## 2017-03-29 ENCOUNTER — Other Ambulatory Visit: Payer: Self-pay | Admitting: Sports Medicine

## 2017-03-29 DIAGNOSIS — M5416 Radiculopathy, lumbar region: Secondary | ICD-10-CM

## 2017-04-07 ENCOUNTER — Encounter: Payer: Self-pay | Admitting: *Deleted

## 2017-04-07 ENCOUNTER — Other Ambulatory Visit: Payer: Self-pay

## 2017-04-07 ENCOUNTER — Emergency Department
Admission: EM | Admit: 2017-04-07 | Discharge: 2017-04-07 | Disposition: A | Discharge status: Home / Self Care | Payer: 59 | Source: Home / Self Care | Attending: Family Medicine | Admitting: Family Medicine

## 2017-04-07 DIAGNOSIS — R110 Nausea: Secondary | ICD-10-CM

## 2017-04-07 DIAGNOSIS — R197 Diarrhea, unspecified: Secondary | ICD-10-CM

## 2017-04-07 NOTE — ED Triage Notes (Signed)
Pt c/o diarrhea, HA, light headed, and nausea x last night. Denies fever or vomiting. Last episode of diarrhea this AM.

## 2017-04-07 NOTE — ED Provider Notes (Signed)
[name]    CSN: [id number] Arrival date & time: 04/07/17  1247     History   Chief Complaint Chief Complaint  Patient presents with  . Diarrhea  . Headache    HPI Sara Wolfe is a 57 y.o. female.   HPI  Sara Wolfe is a 57 y.o. female presenting to UC with c/o nausea without vomiting and associated diarrhea described as loose to watery stool without blood or mucous.  She has had 2 episodes of loose stool this morning.  Mild lower abdominal cramping but denies pain at this time.  Denies urinary symptoms. She has been able to keep down water. She has not tried anything for her symptoms.  She did have to leave work early last night due to feeling bad. She is requesting a work note today.  She notes a family member's children had similar symptoms last week. No recent travel.  Denies fever, chills, cough, congestion or body aches.   Past Medical History:  Diagnosis Date  . GERD (gastroesophageal reflux disease)   . Hot flashes, menopausal   . Medical history non-contributory   . PONV (postoperative nausea and vomiting)     Patient Active Problem List   Diagnosis Date Noted  . Memory change 02/07/2017  . Dysarthria 01/17/2017  . Vitamin B 12 deficiency 12/17/2016  . Abdominal pain, right upper quadrant 08/19/2016  . Chronic pain syndrome 10/16/2015  . Hyperlipidemia 05/20/2015  . Migraine headache 05/16/2015  . Obstructive sleep apnea 05/16/2015  . Lateral epicondylitis of left elbow 05/09/2015  . Left lumbar radiculitis 04/11/2014  . Annual physical exam 02/21/2014  . 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 S/P cervical spinal fusion 10/04/2013  . Hot flash, menopausal 12/27/2011    Past Surgical History:  Procedure Laterality Date  . ANTERIOR CERVICAL DECOMP/DISCECTOMY FUSION N/A 10/04/2013   Procedure: ANTERIOR CERVICAL DECOMPRESSION/DISCECTOMY FUSION 3 LEVELS;  Surgeon: Sinclair Ship, MD;  Location: [facility];  Service: Orthopedics;   Laterality: N/A;  Anterior cervical decompression fusion cervical 4-5, cervical 5-6, cervical 6-7 with instrumentation and allograft  . ANTERIOR CERVICAL DECOMP/DISCECTOMY FUSION N/A 04/11/2014   Procedure: ANTERIOR CERVICAL DECOMPRESSION/DISCECTOMY FUSION 1 LEVEL;  Surgeon: Sinclair Ship, MD;  Location: [facility];  Service: Orthopedics;  Laterality: N/A;  Anterior cervical decompression fusion, cervical 7-thoracic 1 with instrumentation and allograft  . FOOT SURGERY    . GASTRIC BYPASS    . TUBAL LIGATION      OB History    Gravida Para Term Preterm AB Living   2 2 2     2    SAB TAB Ectopic Multiple Live Births                   Home Medications    Prior to Admission medications   Medication Sig Start Date End Date Taking? Authorizing Provider  acetaminophen (TYLENOL) 650 MG CR tablet Take 1 tablet (650 mg total) by mouth every 8 (eight) hours as needed for pain. 02/11/17   Silverio Decamp, MD  AMBULATORY NON FORMULARY MEDICATION Continuous positive airway pressure (CPAP) machine autopap 5-21 cm of H2O pressure (or autoPAP if available), with all supplemental supplies as needed.  To [facility]. AHI=15 12/16/16   Silverio Decamp, MD  cyanocobalamin (,VITAMIN B-12,) 1000 MCG/ML injection Inject 1000 mcg weekly intramuscular for 1 month then monthly thereafter 12/17/16   Silverio Decamp, MD  DULoxetine (CYMBALTA) 60 MG capsule Take 1 capsule (60 mg total) by mouth daily. 03/02/17  Silverio Decamp, MD  estradiol (ESTRACE) 0.1 MG/GM vaginal cream Apply 1 gram per vagina every night for 2 weeks, then apply three times a week 01/04/17   Constant, Peggy, MD  folic acid (FOLVITE) 1 MG tablet TAKE 5 TABLETS (5 MG TOTAL) BY MOUTH DAILY. 02/21/17   Silverio Decamp, MD  gabapentin (NEURONTIN) 800 MG tablet Take 1 tablet (800 mg total) by mouth 3 (three) times daily. 03/08/17   Silverio Decamp, MD  methocarbamol (ROBAXIN) 500 MG tablet Take 1 tablet (500 mg total)  by mouth 3 (three) times daily. 01/17/17   Silverio Decamp, MD  pregabalin (LYRICA) 200 MG capsule Take 1 capsule (200 mg total) by mouth 2 (two) times daily. 01/17/17   Silverio Decamp, MD  rizatriptan (MAXALT-MLT) 10 MG disintegrating tablet TAKE 1 TABLET (10 MG TOTAL) BY MOUTH AS NEEDED FOR MIGRAINE. MAY REPEAT IN 2 HOURS IF NEEDED 02/21/16   Silverio Decamp, MD  topiramate (TOPAMAX) 100 MG tablet TAKE 1 TABLET BY MOUTH EVERYDAY AT BEDTIME 03/28/17   Silverio Decamp, MD  traMADol (ULTRAM) 50 MG tablet TAKE 1 TABLET BY MOUTH EVERY 8 HOURS 03/29/17   Silverio Decamp, MD    Family History Family History  Problem Relation Age of Onset  . Colon cancer Sister     Social History Social History   Tobacco Use  . Smoking status: Light Tobacco Smoker    Packs/day: 0.25    Years: 2.00    Pack years: 0.50    Types: Cigarettes    Last attempt to quit: 04/02/2012    Years since quitting: 5.0  . Smokeless tobacco: Never Used  Substance Use Topics  . Alcohol use: No  . Drug use: No     Allergies   Augmentin [amoxicillin-pot clavulanate]   Review of Systems Review of Systems  Constitutional: Negative for chills, fatigue and fever.  Gastrointestinal: Positive for diarrhea and nausea. Negative for abdominal pain and vomiting.  Genitourinary: Negative for dysuria, frequency and urgency.  Musculoskeletal: Negative for back pain and myalgias.  Neurological: Positive for headaches (mild). Negative for dizziness and light-headedness.     Physical Exam Triage Vital Signs ED Triage Vitals  Enc Vitals Group     BP 04/07/17 1317 119/82     Pulse Rate 04/07/17 1317 70     Resp 04/07/17 1317 16     Temp 04/07/17 1317 98.5 F (36.9 C)     Temp Source 04/07/17 1317 Oral     SpO2 04/07/17 1317 98 %     Weight 04/07/17 1318 151 lb (68.5 kg)     Height 04/07/17 1318 5\' 2"  (1.575 m)     Head Circumference --      Peak Flow --      Pain Score 04/07/17 1318 0      Pain Loc --      Pain Edu? --      Excl. in [place]? --    No data found.  Updated Vital Signs BP 119/82 (BP Location: Right Arm)   Pulse 70   Temp 98.5 F (36.9 C) (Oral)   Resp 16   Ht 5\' 2"  (1.575 m)   Wt 151 lb (68.5 kg)   SpO2 98%   BMI 27.62 kg/m   Visual Acuity Right Eye Distance:   Left Eye Distance:   Bilateral Distance:    Right Eye Near:   Left Eye Near:    Bilateral Near:     Physical  Exam  Constitutional: She is oriented to person, place, and time. She appears well-developed and well-nourished.  Non-toxic appearance. She does not appear ill. No distress.  HENT:  Head: Normocephalic and atraumatic.  Mouth/Throat: Oropharynx is clear and moist.  Eyes: EOM are normal.  Neck: Normal range of motion. Neck supple.  Cardiovascular: Normal rate and regular rhythm.  Pulmonary/Chest: Effort normal and breath sounds normal. No stridor. No respiratory distress. She has no wheezes. She has no rales.  Abdominal: Soft. Bowel sounds are normal. She exhibits no distension and no mass. There is no tenderness. There is no guarding.  Musculoskeletal: Normal range of motion.  Neurological: She is alert and oriented to person, place, and time.  Skin: Skin is warm and dry.  Psychiatric: She has a normal mood and affect. Her behavior is normal.  Nursing note and vitals reviewed.    UC Treatments / Results  Labs (all labs ordered are listed, but only abnormal results are displayed) Labs Reviewed - No data to display  EKG  EKG Interpretation None       Radiology No results found.  Procedures Procedures (including critical care time)  Medications Ordered in UC Medications - No data to display   Initial Impression / Assessment and Plan / UC Course  I have reviewed the triage vital signs and the nursing notes.  Pertinent labs & imaging results that were available during my care of the patient were reviewed by me and considered in my medical decision making (see chart  for details).     Hx and exam c/w viral GI illness Benign abdominal exam Home care instructions provided She notes she does have zofran at home. Declined new refill Encouraged rest and good hydration. F/u with PCP as needed    Final Clinical Impressions(s) / UC Diagnoses   Final diagnoses:  Nausea without vomiting  Diarrhea of presumed infectious origin    ED Discharge Orders    None       Controlled Substance Prescriptions [place] Controlled Substance Registry consulted? Not Applicable   Tyrell Antonio 04/07/17 1339

## 2017-04-11 ENCOUNTER — Ambulatory Visit (INDEPENDENT_AMBULATORY_CARE_PROVIDER_SITE_OTHER): Payer: 59 | Admitting: Sports Medicine

## 2017-04-11 VITALS — BP 105/56 | HR 67

## 2017-04-11 DIAGNOSIS — E538 Deficiency of other specified B group vitamins: Secondary | ICD-10-CM | POA: Diagnosis not present

## 2017-04-11 MED ORDER — CYANOCOBALAMIN 1000 MCG/ML IJ SOLN
1000.0000 ug | Freq: Once | INTRAMUSCULAR | Status: AC
  Administered 2017-04-11: 1000 ug via INTRAMUSCULAR

## 2017-04-11 NOTE — Progress Notes (Signed)
Pt came into clinic today for her vitamin B12 injection. Denies muscle cramps, weakness or irregular heart rate. Denies any complications associated with the injection. Pt tolerated injection in her right deltoid well, no immediate complications. Advised to return in one month for next shot, verbalized understanding. Pt was seen recently in the urgent care for nausea, vomiting, diarrhea. She reports she is feeling better but is having some lightheadedness. I advised Pt to increase her fluid intake. Her husband is seeing PCP today for an appt, so PCP advised of complaint. No further questions.

## 2017-04-26 ENCOUNTER — Other Ambulatory Visit: Payer: Self-pay | Admitting: Sports Medicine

## 2017-04-26 DIAGNOSIS — M5416 Radiculopathy, lumbar region: Secondary | ICD-10-CM

## 2017-05-03 ENCOUNTER — Other Ambulatory Visit: Payer: Self-pay | Admitting: Sports Medicine

## 2017-05-03 DIAGNOSIS — Z981 Arthrodesis status: Secondary | ICD-10-CM

## 2017-05-04 ENCOUNTER — Telehealth: Payer: Self-pay | Admitting: Sports Medicine

## 2017-05-04 DIAGNOSIS — E538 Deficiency of other specified B group vitamins: Secondary | ICD-10-CM

## 2017-05-04 NOTE — Telephone Encounter (Signed)
Pt called and stated she needs a cbc panel ordered for her surgery she is having in a month. She would like to get this done on Friday if possible. Also, she is not due for her b12 until Monday the 18th but would it be ok for her to get it Friday the 15th. Thanks

## 2017-05-05 NOTE — Telephone Encounter (Signed)
Called pt to let her know. Thanks

## 2017-05-05 NOTE — Telephone Encounter (Signed)
Orders placed.  Also yes OK to get B12 done early.

## 2017-05-06 ENCOUNTER — Ambulatory Visit (INDEPENDENT_AMBULATORY_CARE_PROVIDER_SITE_OTHER): Payer: 59 | Admitting: Sports Medicine

## 2017-05-06 VITALS — BP 116/64 | HR 60

## 2017-05-06 DIAGNOSIS — E538 Deficiency of other specified B group vitamins: Secondary | ICD-10-CM | POA: Diagnosis not present

## 2017-05-06 MED ORDER — CYANOCOBALAMIN 1000 MCG/ML IJ SOLN
1000.0000 ug | Freq: Once | INTRAMUSCULAR | Status: AC
  Administered 2017-05-06: 1000 ug via INTRAMUSCULAR

## 2017-05-06 NOTE — Progress Notes (Signed)
B12 injection given LD without immediate complications.

## 2017-05-07 LAB — CBC
HCT: 38.6 % (ref 35.0–45.0)
Hemoglobin: 13.1 g/dL (ref 11.7–15.5)
MCH: 31.1 pg (ref 27.0–33.0)
MCHC: 33.9 g/dL (ref 32.0–36.0)
MCV: 91.7 fL (ref 80.0–100.0)
MPV: 10.9 fL (ref 7.5–12.5)
Platelets: 237 10*3/uL (ref 140–400)
RBC: 4.21 10*6/uL (ref 3.80–5.10)
RDW: 13.1 % (ref 11.0–15.0)
WBC: 7.2 10*3/uL (ref 3.8–10.8)

## 2017-05-27 ENCOUNTER — Other Ambulatory Visit: Payer: Self-pay | Admitting: Sports Medicine

## 2017-05-27 DIAGNOSIS — M5416 Radiculopathy, lumbar region: Secondary | ICD-10-CM

## 2017-05-27 DIAGNOSIS — Z981 Arthrodesis status: Secondary | ICD-10-CM

## 2017-05-30 ENCOUNTER — Other Ambulatory Visit: Payer: Self-pay | Admitting: Sports Medicine

## 2017-05-30 ENCOUNTER — Other Ambulatory Visit: Payer: Self-pay

## 2017-06-08 ENCOUNTER — Ambulatory Visit: Payer: Self-pay

## 2017-06-09 ENCOUNTER — Ambulatory Visit: Payer: Self-pay

## 2017-06-13 ENCOUNTER — Ambulatory Visit: Payer: 59 | Admitting: Sports Medicine

## 2017-06-13 ENCOUNTER — Encounter: Payer: Self-pay | Admitting: Sports Medicine

## 2017-06-13 VITALS — BP 112/72 | HR 68 | Ht 62.01 in | Wt 152.0 lb

## 2017-06-13 DIAGNOSIS — Z9889 Other specified postprocedural states: Secondary | ICD-10-CM | POA: Insufficient documentation

## 2017-06-13 DIAGNOSIS — E538 Deficiency of other specified B group vitamins: Secondary | ICD-10-CM | POA: Diagnosis not present

## 2017-06-13 MED ORDER — CYANOCOBALAMIN 1000 MCG/ML IJ SOLN
1000.0000 ug | Freq: Once | INTRAMUSCULAR | Status: AC
  Administered 2017-06-13: 1000 ug via INTRAMUSCULAR

## 2017-06-13 NOTE — Assessment & Plan Note (Signed)
Done in [place], sutures and staples removed today, return to see me in 3-4 weeks to reevaluate the incisions.

## 2017-06-13 NOTE — Progress Notes (Signed)
Subjective:    CC: Follow-up  HPI: Several weeks ago this 57 year old female went to [place] and had a facelift, she is here for suture removal.  I reviewed the past medical history, family history, social history, surgical history, and allergies today and no changes were needed.  Please see the problem list section below in epic for further details.  Past Medical History: Past Medical History:  Diagnosis Date  . GERD (gastroesophageal reflux disease)   . Hot flashes, menopausal   . Medical history non-contributory   . PONV (postoperative nausea and vomiting)    Past Surgical History: Past Surgical History:  Procedure Laterality Date  . ANTERIOR CERVICAL DECOMP/DISCECTOMY FUSION N/A 10/04/2013   Procedure: ANTERIOR CERVICAL DECOMPRESSION/DISCECTOMY FUSION 3 LEVELS;  Surgeon: Sinclair Ship, MD;  Location: [facility];  Service: Orthopedics;  Laterality: N/A;  Anterior cervical decompression fusion cervical 4-5, cervical 5-6, cervical 6-7 with instrumentation and allograft  . ANTERIOR CERVICAL DECOMP/DISCECTOMY FUSION N/A 04/11/2014   Procedure: ANTERIOR CERVICAL DECOMPRESSION/DISCECTOMY FUSION 1 LEVEL;  Surgeon: Sinclair Ship, MD;  Location: [facility];  Service: Orthopedics;  Laterality: N/A;  Anterior cervical decompression fusion, cervical 7-thoracic 1 with instrumentation and allograft  . FOOT SURGERY    . GASTRIC BYPASS    . TUBAL LIGATION     Social History: Social History   Socioeconomic History  . Marital status: Married    Spouse name: Not on file  . Number of children: 2  . Years of education: 59  . Highest education level: Not on file  Occupational History  . Occupation: [employer]  Social Needs  . Financial resource strain: Not on file  . Food insecurity:    Worry: Not on file    Inability: Not on file  . Transportation needs:    Medical: Not on file    Non-medical: Not on file  Tobacco Use  . Smoking status: Light Tobacco Smoker    Packs/day:  0.25    Years: 2.00    Pack years: 0.50    Types: Cigarettes    Last attempt to quit: 04/02/2012    Years since quitting: 5.2  . Smokeless tobacco: Never Used  Substance and Sexual Activity  . Alcohol use: No  . Drug use: No  . Sexual activity: Yes    Birth control/protection: Post-menopausal  Lifestyle  . Physical activity:    Days per week: Not on file    Minutes per session: Not on file  . Stress: Not on file  Relationships  . Social connections:    Talks on phone: Not on file    Gets together: Not on file    Attends religious service: Not on file    Active member of club or organization: Not on file    Attends meetings of clubs or organizations: Not on file    Relationship status: Not on file  Other Topics Concern  . Not on file  Social History Narrative   Lives with husband   Caffeine use: tea daily   Right handed    Family History: Family History  Problem Relation Age of Onset  . Colon cancer Sister    Allergies: Allergies  Allergen Reactions  . Augmentin [Amoxicillin-Pot Clavulanate] Diarrhea   Medications: See med rec.  Review of Systems: No fevers, chills, night sweats, weight loss, chest pain, or shortness of breath.   Objective:    General: Well Developed, well nourished, and in no acute distress.  Neuro: Alert and oriented x3, extra-ocular muscles  intact, sensation grossly intact.  HEENT: Normocephalic, atraumatic, pupils equal round reactive to light, neck supple, no masses, no lymphadenopathy, thyroid nonpalpable.  Skin: Warm and dry, no rashes.  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  Multiple staples and sutures were removed. Cardiac: Regular rate and rhythm, no murmurs rubs or gallops, no lower extremity edema.  Respiratory: Clear to auscultation bilaterally. Not using accessory muscles, speaking in full sentences.  Impression and Recommendations:    History of facelift Done in [place], sutures and  staples removed today, return to see me in 3-4 weeks to reevaluate the incisions.  I spent 40 minutes with this patient, greater than 50% was face-to-face time counseling regarding the above diagnoses ___________________________________________ Gwen Her. [name], M.D., ABFM., CAQSM. Primary Care and [place] Instructor of [place] [facility] of Medicine

## 2017-06-17 ENCOUNTER — Emergency Department (INDEPENDENT_AMBULATORY_CARE_PROVIDER_SITE_OTHER): Payer: 59

## 2017-06-17 ENCOUNTER — Other Ambulatory Visit: Payer: Self-pay

## 2017-06-17 ENCOUNTER — Emergency Department
Admission: EM | Admit: 2017-06-17 | Discharge: 2017-06-17 | Disposition: A | Discharge status: Home / Self Care | Payer: 59 | Source: Home / Self Care | Attending: Family Medicine | Admitting: Family Medicine

## 2017-06-17 DIAGNOSIS — R0781 Pleurodynia: Secondary | ICD-10-CM

## 2017-06-17 DIAGNOSIS — S299XXA Unspecified injury of thorax, initial encounter: Secondary | ICD-10-CM | POA: Diagnosis not present

## 2017-06-17 NOTE — ED Triage Notes (Signed)
Pt c/o l side rib discomfort since Tuesday. Hit her ribs on her garden tub and felt a "pop". Has been taking tylenol as needed for pain.

## 2017-06-17 NOTE — Discharge Instructions (Addendum)
Apply ice pack for 20 to 30 minutes, 2 to 3 times daily  Continue until pain and swelling decrease. May continue Tylenol as needed.  May also take tramadol as needed for pain.

## 2017-06-17 NOTE — ED Provider Notes (Signed)
[facility]    CSN: [id number] Arrival date & time: 06/17/17  1408     History   Chief Complaint Chief Complaint  Patient presents with  . Rib Injury    left    HPI Sara Wolfe is a 57 y.o. female.   Patient leaned over a tub three days ago and felt a popping sensation in her left lateral chest.  The pain has persisted and is worse with inspiration.  No shortness of breath.  The history is provided by the patient.  Chest Pain  Pain location:  L chest Pain quality: sharp   Pain radiates to:  Does not radiate Pain severity:  Moderate Onset quality:  Sudden Duration:  3 days Timing:  Constant Progression:  Unchanged Chronicity:  New Context: breathing, movement, raising an arm and at rest   Relieved by:  Nothing Worsened by:  Coughing, deep breathing and movement Ineffective treatments: Tylenol. Associated symptoms: no abdominal pain, no back pain, no cough, no diaphoresis, no dizziness, no fatigue, no fever, no nausea, no palpitations, no shortness of breath and no syncope     Past Medical History:  Diagnosis Date  . GERD (gastroesophageal reflux disease)   . Hot flashes, menopausal   . Medical history non-contributory   . PONV (postoperative nausea and vomiting)     Patient Active Problem List   Diagnosis Date Noted  . History of facelift 06/13/2017  . Memory change 02/07/2017  . Dysarthria 01/17/2017  . Vitamin B 12 deficiency 12/17/2016  . Abdominal pain, right upper quadrant 08/19/2016  . Chronic pain syndrome 10/16/2015  . Hyperlipidemia 05/20/2015  . Migraine headache 05/16/2015  . Obstructive sleep apnea 05/16/2015  . Lateral epicondylitis of left elbow 05/09/2015  . Left lumbar radiculitis 04/11/2014  . Annual physical exam 02/21/2014  . 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 S/P cervical spinal fusion 10/04/2013  . Hot flash, menopausal 12/27/2011    Past Surgical History:  Procedure Laterality Date  . ANTERIOR CERVICAL  DECOMP/DISCECTOMY FUSION N/A 10/04/2013   Procedure: ANTERIOR CERVICAL DECOMPRESSION/DISCECTOMY FUSION 3 LEVELS;  Surgeon: Sinclair Ship, MD;  Location: [facility];  Service: Orthopedics;  Laterality: N/A;  Anterior cervical decompression fusion cervical 4-5, cervical 5-6, cervical 6-7 with instrumentation and allograft  . ANTERIOR CERVICAL DECOMP/DISCECTOMY FUSION N/A 04/11/2014   Procedure: ANTERIOR CERVICAL DECOMPRESSION/DISCECTOMY FUSION 1 LEVEL;  Surgeon: Sinclair Ship, MD;  Location: [facility];  Service: Orthopedics;  Laterality: N/A;  Anterior cervical decompression fusion, cervical 7-thoracic 1 with instrumentation and allograft  . FOOT SURGERY    . GASTRIC BYPASS    . TUBAL LIGATION      OB History    Gravida  2   Para  2   Term  2   Preterm      AB      Living  2     SAB      TAB      Ectopic      Multiple      Live Births               Home Medications    Prior to Admission medications   Medication Sig Start Date End Date Taking? Authorizing Provider  acetaminophen (TYLENOL) 650 MG CR tablet Take 1 tablet (650 mg total) by mouth every 8 (eight) hours as needed for pain. 02/11/17   Silverio Decamp, MD  AMBULATORY NON FORMULARY MEDICATION Continuous positive airway pressure (CPAP) machine autopap 5-21 cm of H2O pressure (  or autoPAP if available), with all supplemental supplies as needed.  To [facility]. AHI=15 12/16/16   Silverio Decamp, MD  cyanocobalamin (,VITAMIN B-12,) 1000 MCG/ML injection Inject 1000 mcg weekly intramuscular for 1 month then monthly thereafter 12/17/16   Silverio Decamp, MD  DULoxetine (CYMBALTA) 60 MG capsule TAKE 1 CAPSULE BY MOUTH EVERY DAY 05/27/17   Silverio Decamp, MD  estradiol (ESTRACE) 0.1 MG/GM vaginal cream Apply 1 gram per vagina every night for 2 weeks, then apply three times a week 01/04/17   Constant, Peggy, MD  folic acid (FOLVITE) 1 MG tablet TAKE 5 TABLETS (5 MG TOTAL) BY MOUTH DAILY. 02/21/17    Silverio Decamp, MD  gabapentin (NEURONTIN) 800 MG tablet TAKE 1 TABLET BY MOUTH THREE TIMES A DAY 05/30/17   Silverio Decamp, MD  LYRICA 200 MG capsule TAKE 1 CAPSULE BY MOUTH TWICE A DAY 05/30/17   Silverio Decamp, MD  methocarbamol (ROBAXIN) 500 MG tablet Take 1 tablet (500 mg total) by mouth 3 (three) times daily. 01/17/17   Silverio Decamp, MD  rizatriptan (MAXALT-MLT) 10 MG disintegrating tablet TAKE 1 TABLET (10 MG TOTAL) BY MOUTH AS NEEDED FOR MIGRAINE. MAY REPEAT IN 2 HOURS IF NEEDED 02/21/16   Silverio Decamp, MD  traMADol (ULTRAM) 50 MG tablet TAKE 1 TABLET BY MOUTH EVERY 8 HOURS 03/29/17   Silverio Decamp, MD  traMADol (ULTRAM) 50 MG tablet TAKE 1 TABLET BY MOUTH EVERY 8 HOURS 05/27/17   Silverio Decamp, MD    Family History Family History  Problem Relation Age of Onset  . Colon cancer Sister     Social History Social History   Tobacco Use  . Smoking status: Light Tobacco Smoker    Packs/day: 0.25    Years: 2.00    Pack years: 0.50    Types: Cigarettes    Last attempt to quit: 04/02/2012    Years since quitting: 5.2  . Smokeless tobacco: Never Used  Substance Use Topics  . Alcohol use: No  . Drug use: No     Allergies   Augmentin [amoxicillin-pot clavulanate]   Review of Systems Review of Systems  Constitutional: Negative for diaphoresis, fatigue and fever.  Respiratory: Negative for cough, shortness of breath, wheezing and stridor.   Cardiovascular: Positive for chest pain. Negative for palpitations and syncope.  Gastrointestinal: Negative for abdominal pain and nausea.  Musculoskeletal: Negative for back pain.  Neurological: Negative for dizziness.  All other systems reviewed and are negative.    Physical Exam Triage Vital Signs ED Triage Vitals  Enc Vitals Group     BP 06/17/17 1445 117/78     Pulse Rate 06/17/17 1445 66     Resp 06/17/17 1445 18     Temp 06/17/17 1445 98.7 F (37.1 C)     Temp Source  06/17/17 1445 Oral     SpO2 06/17/17 1445 98 %     Weight 06/17/17 1446 156 lb (70.8 kg)     Height 06/17/17 1446 5\' 2"  (1.575 m)     Head Circumference --      Peak Flow --      Pain Score 06/17/17 1446 7     Pain Loc --      Pain Edu? --      Excl. in [place]? --    No data found.  Updated Vital Signs BP 117/78 (BP Location: Right Arm)   Pulse 66   Temp 98.7 F (37.1 C) (Oral)  Resp 18   Ht 5\' 2"  (1.575 m)   Wt 156 lb (70.8 kg)   SpO2 98%   BMI 28.53 kg/m   Visual Acuity Right Eye Distance:   Left Eye Distance:   Bilateral Distance:    Right Eye Near:   Left Eye Near:    Bilateral Near:     Physical Exam  Constitutional: She appears well-developed and well-nourished. No distress.  HENT:  Head: Normocephalic.  Right Ear: External ear normal.  Left Ear: External ear normal.  Nose: Nose normal.  Mouth/Throat: Oropharynx is clear and moist.  Eyes: Pupils are equal, round, and reactive to light.  Neck: Normal range of motion.  Cardiovascular: Normal heart sounds.  Pulmonary/Chest: Effort normal and breath sounds normal. No stridor. No respiratory distress. She has no wheezes. She has no rales. She exhibits tenderness.  Localized tenderness left ribs as noted on diagram.     Abdominal: There is no tenderness.  Musculoskeletal: She exhibits no edema or tenderness.  Neurological: She is alert.  Skin: Skin is warm and dry. No rash noted. She is not diaphoretic.  Nursing note and vitals reviewed.    UC Treatments / Results  Labs (all labs ordered are listed, but only abnormal results are displayed) Labs Reviewed - No data to display  EKG None Radiology Dg Ribs Unilateral W/chest Left  Result Date: 06/17/2017 CLINICAL DATA:  Sudden onset of left lateral rib pain after reaching 3 days ago EXAM: LEFT RIBS AND CHEST - 3+ VIEW COMPARISON:  Chest x-ray of 01/07/2016 FINDINGS: The lungs are clear. Mediastinal and hilar contours are unremarkable. The heart is within  upper limits of normal. A lower anterior cervical spine fusion plate is present. Left rib detail films show no evidence of acute left rib fracture. IMPRESSION: 1. No active lung disease. 2. Negative left rib detail. Electronically Signed   By: Ivar Drape M.D.   On: 06/17/2017 15:20    Procedures Procedures (including critical care time)  Medications Ordered in UC Medications - No data to display   Initial Impression / Assessment and Plan / UC Course  I have reviewed the triage vital signs and the nursing notes.  Pertinent labs & imaging results that were available during my care of the patient were reviewed by me and considered in my medical decision making (see chart for details).    Patient declined rib belt. Apply ice pack for 20 to 30 minutes, 2 to 3 times daily  Continue until pain and swelling decrease. May continue Tylenol as needed.  May also take tramadol as needed for pain. Followup with Family Doctor if not improved in about 2 weeks.    Final Clinical Impressions(s) / UC Diagnoses   Final diagnoses:  Rib pain on left side    ED Discharge Orders    None         Kandra Nicolas, MD 06/19/17 [phone]

## 2017-06-22 ENCOUNTER — Ambulatory Visit: Payer: 59 | Admitting: Sports Medicine

## 2017-06-22 DIAGNOSIS — Z903 Acquired absence of stomach [part of]: Secondary | ICD-10-CM | POA: Diagnosis not present

## 2017-06-22 DIAGNOSIS — Z9889 Other specified postprocedural states: Secondary | ICD-10-CM

## 2017-06-22 MED ORDER — PHENTERMINE HCL 37.5 MG PO TABS: ORAL_TABLET | ORAL | 0 refills | Status: DC

## 2017-06-22 NOTE — Assessment & Plan Note (Signed)
Wound looks good, one remaining suture was removed today.

## 2017-06-22 NOTE — Assessment & Plan Note (Signed)
Over 100 pound weight loss. We will try another course of phentermine. Return in a month.

## 2017-06-22 NOTE — Progress Notes (Signed)
Subjective:    CC: Follow-up  HPI: History of facelift: Doing extremely well, she does have a couple of additional sutures that were not removed at the last visit.  History of morbid obesity: Over 100 pound weight loss with sleeve gastrectomy, would like to lose about 15 more pounds, wondering if she can do phentermine.  I reviewed the past medical history, family history, social history, surgical history, and allergies today and no changes were needed.  Please see the problem list section below in epic for further details.  Past Medical History: Past Medical History:  Diagnosis Date  . GERD (gastroesophageal reflux disease)   . Hot flashes, menopausal   . Medical history non-contributory   . PONV (postoperative nausea and vomiting)    Past Surgical History: Past Surgical History:  Procedure Laterality Date  . ANTERIOR CERVICAL DECOMP/DISCECTOMY FUSION N/A 10/04/2013   Procedure: ANTERIOR CERVICAL DECOMPRESSION/DISCECTOMY FUSION 3 LEVELS;  Surgeon: Sinclair Ship, MD;  Location: [place];  Service: Orthopedics;  Laterality: N/A;  Anterior cervical decompression fusion cervical 4-5, cervical 5-6, cervical 6-7 with instrumentation and allograft  . ANTERIOR CERVICAL DECOMP/DISCECTOMY FUSION N/A 04/11/2014   Procedure: ANTERIOR CERVICAL DECOMPRESSION/DISCECTOMY FUSION 1 LEVEL;  Surgeon: Sinclair Ship, MD;  Location: [place];  Service: Orthopedics;  Laterality: N/A;  Anterior cervical decompression fusion, cervical 7-thoracic 1 with instrumentation and allograft  . FOOT SURGERY    . GASTRIC BYPASS    . TUBAL LIGATION     Social History: Social History   Socioeconomic History  . Marital status: Married    Spouse name: Not on file  . Number of children: 2  . Years of education: 63  . Highest education level: Not on file  Occupational History  . Occupation: [employer]  Social Needs  . Financial resource strain: Not on file  . Food insecurity:    Worry: Not on file   Inability: Not on file  . Transportation needs:    Medical: Not on file    Non-medical: Not on file  Tobacco Use  . Smoking status: Light Tobacco Smoker    Packs/day: 0.25    Years: 2.00    Pack years: 0.50    Types: Cigarettes    Last attempt to quit: 04/02/2012    Years since quitting: 5.2  . Smokeless tobacco: Never Used  Substance and Sexual Activity  . Alcohol use: No  . Drug use: No  . Sexual activity: Yes    Birth control/protection: Post-menopausal  Lifestyle  . Physical activity:    Days per week: Not on file    Minutes per session: Not on file  . Stress: Not on file  Relationships  . Social connections:    Talks on phone: Not on file    Gets together: Not on file    Attends religious service: Not on file    Active member of club or organization: Not on file    Attends meetings of clubs or organizations: Not on file    Relationship status: Not on file  Other Topics Concern  . Not on file  Social History Narrative   Lives with husband   Caffeine use: tea daily   Right handed    Family History: Family History  Problem Relation Age of Onset  . Colon cancer Sister    Allergies: Allergies  Allergen Reactions  . Augmentin [Amoxicillin-Pot Clavulanate] Diarrhea   Medications: See med rec.  Review of Systems: No fevers, chills, night sweats, weight loss, chest pain, or  shortness of breath.   Objective:    General: Well Developed, well nourished, and in no acute distress.  Neuro: Alert and oriented x3, extra-ocular muscles intact, sensation grossly intact.  HEENT: Normocephalic, atraumatic, pupils equal round reactive to light, neck supple, no masses, no lymphadenopathy, thyroid nonpalpable.  Skin: Warm and dry, no rashes.  Single additional suture above the left ear removed today.  Otherwise incisions look fantastic. Cardiac: Regular rate and rhythm, no murmurs rubs or gallops, no lower extremity edema.  Respiratory: Clear to auscultation bilaterally. Not  using accessory muscles, speaking in full sentences.  Impression and Recommendations:    History of facelift Wound looks good, one remaining suture was removed today.  History of sleeve gastrectomy Over 100 pound weight loss. We will try another course of phentermine. Return in a month.  I spent 25 minutes with this patient, greater than 50% was face-to-face time counseling regarding the above diagnoses ___________________________________________ Gwen Her. [name], M.D., ABFM., CAQSM. Primary Care and [place] Instructor of [place] of [facility] of Medicine

## 2017-06-28 ENCOUNTER — Other Ambulatory Visit: Payer: Self-pay | Admitting: Sports Medicine

## 2017-06-28 DIAGNOSIS — M5416 Radiculopathy, lumbar region: Secondary | ICD-10-CM

## 2017-07-04 ENCOUNTER — Telehealth: Payer: Self-pay | Admitting: Sports Medicine

## 2017-07-11 ENCOUNTER — Ambulatory Visit (INDEPENDENT_AMBULATORY_CARE_PROVIDER_SITE_OTHER): Payer: 59 | Admitting: Sports Medicine

## 2017-07-11 VITALS — BP 118/68 | HR 71 | Temp 99.7°F | Resp 16 | Wt 154.0 lb

## 2017-07-11 DIAGNOSIS — E538 Deficiency of other specified B group vitamins: Secondary | ICD-10-CM | POA: Diagnosis not present

## 2017-07-11 DIAGNOSIS — Z903 Acquired absence of stomach [part of]: Secondary | ICD-10-CM | POA: Diagnosis not present

## 2017-07-11 MED ORDER — PHENTERMINE HCL 37.5 MG PO TABS: ORAL_TABLET | ORAL | 0 refills | Status: DC

## 2017-07-11 MED ORDER — CYANOCOBALAMIN 1000 MCG/ML IJ SOLN
1000.0000 ug | Freq: Once | INTRAMUSCULAR | Status: AC
  Administered 2017-07-11: 1000 ug via INTRAMUSCULAR

## 2017-07-11 NOTE — Addendum Note (Signed)
Addended by: Silverio Decamp on: 07/11/2017 11:54 AM   Modules accepted: Orders, Level of Service

## 2017-07-11 NOTE — Assessment & Plan Note (Signed)
Has only lost a single additional pound, refilling phentermine, she needs to do a 4-week follow-up with me. If still has not lost much weight we are going to add Topamax again. Overall she has lost over 100 pounds post sleeve gastrectomy.

## 2017-07-11 NOTE — Progress Notes (Signed)
   Subjective:    Patient ID: Sara Wolfe, female    DOB: 03-19-1960, 57 y.o.   MRN: [id number]  HPI Patient here for Vit B12 injection; denies any adverse side effects including GI disturbances. We also are synchronizing this visit with her phentermine weight and BP check so she does not have to come twice a month: she denies palpitations, difficulty sleeping, and other problems.    Review of Systems     Objective:   Physical Exam        Assessment & Plan:  Patient tolerated B12 injection well without complications. She has lost weight and would like to continue on phentermine with rx to standing pharmacy.

## 2017-07-20 ENCOUNTER — Ambulatory Visit: Payer: Self-pay | Admitting: Sports Medicine

## 2017-07-22 ENCOUNTER — Other Ambulatory Visit: Payer: Self-pay | Admitting: Sports Medicine

## 2017-07-27 ENCOUNTER — Other Ambulatory Visit: Payer: Self-pay | Admitting: Sports Medicine

## 2017-08-08 ENCOUNTER — Ambulatory Visit (INDEPENDENT_AMBULATORY_CARE_PROVIDER_SITE_OTHER): Payer: 59 | Admitting: Sports Medicine

## 2017-08-08 ENCOUNTER — Encounter: Payer: Self-pay | Admitting: Sports Medicine

## 2017-08-08 VITALS — BP 110/71 | HR 75 | Ht 62.0 in | Wt 160.0 lb

## 2017-08-08 DIAGNOSIS — Z903 Acquired absence of stomach [part of]: Secondary | ICD-10-CM | POA: Diagnosis not present

## 2017-08-08 DIAGNOSIS — H02729 Madarosis of unspecified eye, unspecified eyelid and periocular area: Secondary | ICD-10-CM

## 2017-08-08 DIAGNOSIS — E538 Deficiency of other specified B group vitamins: Secondary | ICD-10-CM | POA: Diagnosis not present

## 2017-08-08 MED ORDER — TOPIRAMATE 50 MG PO TABS: ORAL_TABLET | ORAL | 0 refills | Status: DC

## 2017-08-08 MED ORDER — BIMATOPROST 0.03 % EX SOLN: CUTANEOUS | 12 refills | Status: DC

## 2017-08-08 MED ORDER — PHENTERMINE HCL 37.5 MG PO TABS: ORAL_TABLET | ORAL | 0 refills | Status: DC

## 2017-08-08 MED ORDER — CYANOCOBALAMIN 1000 MCG/ML IJ SOLN
1000.0000 ug | Freq: Once | INTRAMUSCULAR | Status: AC
  Administered 2017-08-08: 1000 ug via INTRAMUSCULAR

## 2017-08-08 NOTE — Assessment & Plan Note (Signed)
Trial of Latisse.

## 2017-08-08 NOTE — Progress Notes (Signed)
Subjective:    CC: Weight check  HPI: Normal returns, she is a 57 year old female, we are helping her lose weight, she lost over 100 pounds with sleeve gastrectomy, she did desire to lose a bit more weight, we have done 2 months of phentermine without significant weight loss.  She has noted increasing headaches, has been on topiramate in the past.  I reviewed the past medical history, family history, social history, surgical history, and allergies today and no changes were needed.  Please see the problem list section below in epic for further details.  Past Medical History: Past Medical History:  Diagnosis Date  . GERD (gastroesophageal reflux disease)   . Hot flashes, menopausal   . Medical history non-contributory   . PONV (postoperative nausea and vomiting)    Past Surgical History: Past Surgical History:  Procedure Laterality Date  . ANTERIOR CERVICAL DECOMP/DISCECTOMY FUSION N/A 10/04/2013   Procedure: ANTERIOR CERVICAL DECOMPRESSION/DISCECTOMY FUSION 3 LEVELS;  Surgeon: Sinclair Ship, MD;  Location: [place];  Service: Orthopedics;  Laterality: N/A;  Anterior cervical decompression fusion cervical 4-5, cervical 5-6, cervical 6-7 with instrumentation and allograft  . ANTERIOR CERVICAL DECOMP/DISCECTOMY FUSION N/A 04/11/2014   Procedure: ANTERIOR CERVICAL DECOMPRESSION/DISCECTOMY FUSION 1 LEVEL;  Surgeon: Sinclair Ship, MD;  Location: [place];  Service: Orthopedics;  Laterality: N/A;  Anterior cervical decompression fusion, cervical 7-thoracic 1 with instrumentation and allograft  . FOOT SURGERY    . GASTRIC BYPASS    . TUBAL LIGATION     Social History: Social History   Socioeconomic History  . Marital status: Married    Spouse name: Not on file  . Number of children: 2  . Years of education: 66  . Highest education level: Not on file  Occupational History  . Occupation: [employer]  Social Needs  . Financial resource strain: Not on file  . Food insecurity:   Worry: Not on file    Inability: Not on file  . Transportation needs:    Medical: Not on file    Non-medical: Not on file  Tobacco Use  . Smoking status: Light Tobacco Smoker    Packs/day: 0.25    Years: 2.00    Pack years: 0.50    Types: Cigarettes    Last attempt to quit: 04/02/2012    Years since quitting: 5.3  . Smokeless tobacco: Never Used  Substance and Sexual Activity  . Alcohol use: No  . Drug use: No  . Sexual activity: Yes    Birth control/protection: Post-menopausal  Lifestyle  . Physical activity:    Days per week: Not on file    Minutes per session: Not on file  . Stress: Not on file  Relationships  . Social connections:    Talks on phone: Not on file    Gets together: Not on file    Attends religious service: Not on file    Active member of club or organization: Not on file    Attends meetings of clubs or organizations: Not on file    Relationship status: Not on file  Other Topics Concern  . Not on file  Social History Narrative   Lives with husband   Caffeine use: tea daily   Right handed    Family History: Family History  Problem Relation Age of Onset  . Colon cancer Sister    Allergies: Allergies  Allergen Reactions  . Augmentin [Amoxicillin-Pot Clavulanate] Diarrhea   Medications: See med rec.  Review of Systems: No fevers, chills, night  sweats, weight loss, chest pain, or shortness of breath.   Objective:    General: Well Developed, well nourished, and in no acute distress.  Neuro: Alert and oriented x3, extra-ocular muscles intact, sensation grossly intact.  HEENT: Normocephalic, atraumatic, pupils equal round reactive to light, neck supple, no masses, no lymphadenopathy, thyroid nonpalpable.  Skin: Warm and dry, no rashes. Cardiac: Regular rate and rhythm, no murmurs rubs or gallops, no lower extremity edema.  Respiratory: Clear to auscultation bilaterally. Not using accessory muscles, speaking in full sentences.  Impression and  Recommendations:    History of sleeve gastrectomy Gained a bit of weight, entering the third month, refilling phentermine but adding topiramate. She is also having an increase in her migraine headaches.   Topamax will be efficacious for this as well. Overall she did lose over 100 pounds with a sleeve gastrectomy.  Eyelash scantiness Trial of Latisse.  I spent 25 minutes with this patient, greater than 50% was face-to-face time counseling regarding the above diagnoses ___________________________________________ Gwen Her. [name], M.D., ABFM., CAQSM. Primary Care and [place] Instructor of [place] of [facility] of Medicine

## 2017-08-08 NOTE — Assessment & Plan Note (Signed)
Gained a bit of weight, entering the third month, refilling phentermine but adding topiramate. She is also having an increase in her migraine headaches.   Topamax will be efficacious for this as well. Overall she did lose over 100 pounds with a sleeve gastrectomy.

## 2017-08-09 ENCOUNTER — Ambulatory Visit: Payer: 59 | Admitting: Adult Health

## 2017-08-17 ENCOUNTER — Other Ambulatory Visit: Payer: Self-pay | Admitting: Sports Medicine

## 2017-08-17 DIAGNOSIS — M5416 Radiculopathy, lumbar region: Secondary | ICD-10-CM

## 2017-08-24 ENCOUNTER — Other Ambulatory Visit: Payer: Self-pay | Admitting: Sports Medicine

## 2017-08-24 DIAGNOSIS — Z981 Arthrodesis status: Secondary | ICD-10-CM

## 2017-08-29 NOTE — Telephone Encounter (Signed)
Done

## 2017-08-31 ENCOUNTER — Other Ambulatory Visit: Payer: Self-pay | Admitting: Sports Medicine

## 2017-08-31 DIAGNOSIS — Z903 Acquired absence of stomach [part of]: Secondary | ICD-10-CM

## 2017-09-07 ENCOUNTER — Ambulatory Visit (INDEPENDENT_AMBULATORY_CARE_PROVIDER_SITE_OTHER): Payer: 59 | Admitting: Sports Medicine

## 2017-09-07 DIAGNOSIS — G894 Chronic pain syndrome: Secondary | ICD-10-CM

## 2017-09-07 DIAGNOSIS — E538 Deficiency of other specified B group vitamins: Secondary | ICD-10-CM

## 2017-09-07 DIAGNOSIS — Z903 Acquired absence of stomach [part of]: Secondary | ICD-10-CM

## 2017-09-07 MED ORDER — CYANOCOBALAMIN 1000 MCG/ML IJ SOLN
1000.0000 ug | Freq: Once | INTRAMUSCULAR | Status: AC
  Administered 2017-09-07: 1000 ug via INTRAMUSCULAR

## 2017-09-07 MED ORDER — PHENTERMINE HCL 37.5 MG PO TABS: ORAL_TABLET | ORAL | 0 refills | Status: DC

## 2017-09-07 MED ORDER — TOPIRAMATE 100 MG PO TABS: 100.0000 mg | ORAL_TABLET | Freq: Two times a day (BID) | ORAL | 1 refills | Status: DC

## 2017-09-07 MED ORDER — PREGABALIN 200 MG PO CAPS: 200.0000 mg | ORAL_CAPSULE | Freq: Three times a day (TID) | ORAL | 1 refills | Status: DC

## 2017-09-07 NOTE — Assessment & Plan Note (Signed)
B12 injection today 

## 2017-09-07 NOTE — Assessment & Plan Note (Signed)
1 pound weight loss since last month, entering the fourth month, refilling phentermine and increasing Topamax to 100 mg twice a day.

## 2017-09-07 NOTE — Assessment & Plan Note (Signed)
Increasing Lyrica to 200mg  TID. If insufficient relief we will get a new MRI and consider back injections.

## 2017-09-07 NOTE — Progress Notes (Signed)
Subjective:    CC: Weight check  HPI: Obesity: 1 pound weight loss over the last month.  B12 deficiency: Needs her B12 injection today, post gastric sleeve.  Chronic pain syndrome: Initially did well with 200 mg of Lyrica twice a day, having a bit of an increase in pain and requesting to go up to 3 times daily.  I reviewed the past medical history, family history, social history, surgical history, and allergies today and no changes were needed.  Please see the problem list section below in epic for further details.  Past Medical History: Past Medical History:  Diagnosis Date  . GERD (gastroesophageal reflux disease)   . Hot flashes, menopausal   . Medical history non-contributory   . PONV (postoperative nausea and vomiting)    Past Surgical History: Past Surgical History:  Procedure Laterality Date  . ANTERIOR CERVICAL DECOMP/DISCECTOMY FUSION N/A 10/04/2013   Procedure: ANTERIOR CERVICAL DECOMPRESSION/DISCECTOMY FUSION 3 LEVELS;  Surgeon: Sinclair Ship, MD;  Location: [place];  Service: Orthopedics;  Laterality: N/A;  Anterior cervical decompression fusion cervical 4-5, cervical 5-6, cervical 6-7 with instrumentation and allograft  . ANTERIOR CERVICAL DECOMP/DISCECTOMY FUSION N/A 04/11/2014   Procedure: ANTERIOR CERVICAL DECOMPRESSION/DISCECTOMY FUSION 1 LEVEL;  Surgeon: Sinclair Ship, MD;  Location: [place];  Service: Orthopedics;  Laterality: N/A;  Anterior cervical decompression fusion, cervical 7-thoracic 1 with instrumentation and allograft  . FOOT SURGERY    . GASTRIC BYPASS    . TUBAL LIGATION     Social History: Social History   Socioeconomic History  . Marital status: Married    Spouse name: Not on file  . Number of children: 2  . Years of education: 69  . Highest education level: Not on file  Occupational History  . Occupation: [employer]  Social Needs  . Financial resource strain: Not on file  . Food insecurity:    Worry: Not on file   Inability: Not on file  . Transportation needs:    Medical: Not on file    Non-medical: Not on file  Tobacco Use  . Smoking status: Light Tobacco Smoker    Packs/day: 0.25    Years: 2.00    Pack years: 0.50    Types: Cigarettes    Last attempt to quit: 04/02/2012    Years since quitting: 5.4  . Smokeless tobacco: Never Used  Substance and Sexual Activity  . Alcohol use: No  . Drug use: No  . Sexual activity: Yes    Birth control/protection: Post-menopausal  Lifestyle  . Physical activity:    Days per week: Not on file    Minutes per session: Not on file  . Stress: Not on file  Relationships  . Social connections:    Talks on phone: Not on file    Gets together: Not on file    Attends religious service: Not on file    Active member of club or organization: Not on file    Attends meetings of clubs or organizations: Not on file    Relationship status: Not on file  Other Topics Concern  . Not on file  Social History Narrative   Lives with husband   Caffeine use: tea daily   Right handed    Family History: Family History  Problem Relation Age of Onset  . Colon cancer Sister    Allergies: Allergies  Allergen Reactions  . Augmentin [Amoxicillin-Pot Clavulanate] Diarrhea   Medications: See med rec.  Review of Systems: No fevers, chills, night sweats, weight  loss, chest pain, or shortness of breath.   Objective:    General: Well Developed, well nourished, and in no acute distress.  Neuro: Alert and oriented x3, extra-ocular muscles intact, sensation grossly intact.  HEENT: Normocephalic, atraumatic, pupils equal round reactive to light, neck supple, no masses, no lymphadenopathy, thyroid nonpalpable.  Skin: Warm and dry, no rashes. Cardiac: Regular rate and rhythm, no murmurs rubs or gallops, no lower extremity edema.  Respiratory: Clear to auscultation bilaterally. Not using accessory muscles, speaking in full sentences.  Impression and Recommendations:    History  of sleeve gastrectomy 1 pound weight loss since last month, entering the fourth month, refilling phentermine and increasing Topamax to 100 mg twice a day.  Chronic pain syndrome Increasing Lyrica to 200mg  TID. If insufficient relief we will get a new MRI and consider back injections.  Vitamin B 12 deficiency B12 injection today. ___________________________________________ Gwen Her. [name], M.D., ABFM., CAQSM. Primary Care and [place] Instructor of [place] of [facility] of Medicine

## 2017-09-13 DIAGNOSIS — G4733 Obstructive sleep apnea (adult) (pediatric): Secondary | ICD-10-CM | POA: Diagnosis not present

## 2017-09-29 ENCOUNTER — Other Ambulatory Visit: Payer: Self-pay | Admitting: Sports Medicine

## 2017-09-29 DIAGNOSIS — Z903 Acquired absence of stomach [part of]: Secondary | ICD-10-CM

## 2017-10-02 ENCOUNTER — Other Ambulatory Visit: Payer: Self-pay | Admitting: Sports Medicine

## 2017-10-02 DIAGNOSIS — Z903 Acquired absence of stomach [part of]: Secondary | ICD-10-CM

## 2017-10-05 ENCOUNTER — Ambulatory Visit: Payer: 59 | Admitting: Sports Medicine

## 2017-10-05 ENCOUNTER — Encounter: Payer: Self-pay | Admitting: Sports Medicine

## 2017-10-05 DIAGNOSIS — E538 Deficiency of other specified B group vitamins: Secondary | ICD-10-CM

## 2017-10-05 DIAGNOSIS — M5416 Radiculopathy, lumbar region: Secondary | ICD-10-CM | POA: Diagnosis not present

## 2017-10-05 DIAGNOSIS — Z903 Acquired absence of stomach [part of]: Secondary | ICD-10-CM

## 2017-10-05 MED ORDER — PHENTERMINE HCL 37.5 MG PO TABS: ORAL_TABLET | ORAL | 0 refills | Status: DC

## 2017-10-05 MED ORDER — TOPIRAMATE 50 MG PO TABS: ORAL_TABLET | ORAL | 3 refills | Status: DC

## 2017-10-05 MED ORDER — CYANOCOBALAMIN 1000 MCG/ML IJ SOLN
1000.0000 ug | Freq: Once | INTRAMUSCULAR | Status: AC
  Administered 2017-10-05: 1000 ug via INTRAMUSCULAR

## 2017-10-05 MED ORDER — TRAMADOL HCL 50 MG PO TABS: ORAL_TABLET | ORAL | 0 refills | Status: DC

## 2017-10-05 NOTE — Assessment & Plan Note (Signed)
Refilling tramadol and setting her up for another injection, requested was C7-T1 but space was not available to pass the needle so it was done at T1-T2, this seemed to provide good relief since January.

## 2017-10-05 NOTE — Assessment & Plan Note (Signed)
History of sleeve gastrectomy, B12 shot today.

## 2017-10-05 NOTE — Assessment & Plan Note (Addendum)
4 pound weight loss with increasing to 100 mg of Topamax twice a day, and continuing phentermine. Refilling phentermine, she was having difficulty tolerating Topamax 100 twice a day, dropping back down to 50 in the morning and 100 at bedtime, entering the fifth month.

## 2017-10-05 NOTE — Progress Notes (Signed)
Subjective:    CC: Multiple issues  HPI: Obesity: History of sleeve gastrectomy, good overall weight loss, we have been helping her lose a bit more weight with phentermine and topiramate, she did well until we increased to 100 mg twice a day, she is tolerating 100 mg of Topamax at night but only 50 in the morning.  She did have an additional 4 pound weight loss.  Neck pain: Multilevel cervical fusion, back in January we did a T1-T2 interlaminar epidural, the C7-T1 interspace was not accessible due to her fusion.  Did well, now having recurrence of pain, needs a refill on tramadol.  Sleeve gastrectomy: With malabsorption syndrome, low B12, due for injection today.  I reviewed the past medical history, family history, social history, surgical history, and allergies today and no changes were needed.  Please see the problem list section below in epic for further details.  Past Medical History: Past Medical History:  Diagnosis Date  . GERD (gastroesophageal reflux disease)   . Hot flashes, menopausal   . Medical history non-contributory   . PONV (postoperative nausea and vomiting)    Past Surgical History: Past Surgical History:  Procedure Laterality Date  . ANTERIOR CERVICAL DECOMP/DISCECTOMY FUSION N/A 10/04/2013   Procedure: ANTERIOR CERVICAL DECOMPRESSION/DISCECTOMY FUSION 3 LEVELS;  Surgeon: Sinclair Ship, MD;  Location: [place];  Service: Orthopedics;  Laterality: N/A;  Anterior cervical decompression fusion cervical 4-5, cervical 5-6, cervical 6-7 with instrumentation and allograft  . ANTERIOR CERVICAL DECOMP/DISCECTOMY FUSION N/A 04/11/2014   Procedure: ANTERIOR CERVICAL DECOMPRESSION/DISCECTOMY FUSION 1 LEVEL;  Surgeon: Sinclair Ship, MD;  Location: [place];  Service: Orthopedics;  Laterality: N/A;  Anterior cervical decompression fusion, cervical 7-thoracic 1 with instrumentation and allograft  . FOOT SURGERY    . GASTRIC BYPASS    . TUBAL LIGATION     Social  History: Social History   Socioeconomic History  . Marital status: Married    Spouse name: Not on file  . Number of children: 2  . Years of education: 85  . Highest education level: Not on file  Occupational History  . Occupation: [employer]  Social Needs  . Financial resource strain: Not on file  . Food insecurity:    Worry: Not on file    Inability: Not on file  . Transportation needs:    Medical: Not on file    Non-medical: Not on file  Tobacco Use  . Smoking status: Light Tobacco Smoker    Packs/day: 0.25    Years: 2.00    Pack years: 0.50    Types: Cigarettes    Last attempt to quit: 04/02/2012    Years since quitting: 5.5  . Smokeless tobacco: Never Used  Substance and Sexual Activity  . Alcohol use: No  . Drug use: No  . Sexual activity: Yes    Birth control/protection: Post-menopausal  Lifestyle  . Physical activity:    Days per week: Not on file    Minutes per session: Not on file  . Stress: Not on file  Relationships  . Social connections:    Talks on phone: Not on file    Gets together: Not on file    Attends religious service: Not on file    Active member of club or organization: Not on file    Attends meetings of clubs or organizations: Not on file    Relationship status: Not on file  Other Topics Concern  . Not on file  Social History Narrative   Lives  with husband   Caffeine use: tea daily   Right handed    Family History: Family History  Problem Relation Age of Onset  . Colon cancer Sister    Allergies: Allergies  Allergen Reactions  . Augmentin [Amoxicillin-Pot Clavulanate] Diarrhea   Medications: See med rec.  Review of Systems: No fevers, chills, night sweats, weight loss, chest pain, or shortness of breath.   Objective:    General: Well Developed, well nourished, and in no acute distress.  Neuro: Alert and oriented x3, extra-ocular muscles intact, sensation grossly intact.  HEENT: Normocephalic, atraumatic, pupils equal round  reactive to light, neck supple, no masses, no lymphadenopathy, thyroid nonpalpable.  Skin: Warm and dry, no rashes. Cardiac: Regular rate and rhythm, no murmurs rubs or gallops, no lower extremity edema.  Respiratory: Clear to auscultation bilaterally. Not using accessory muscles, speaking in full sentences.  Impression and Recommendations:    History of sleeve gastrectomy 4 pound weight loss with increasing to 100 mg of Topamax twice a day, and continuing phentermine. Refilling phentermine, she was having difficulty tolerating Topamax 100 twice a day, dropping back down to 50 in the morning and 100 at bedtime, entering the fifth month.  Vitamin B 12 deficiency History of sleeve gastrectomy, B12 shot today.  Left lumbar radiculitis Refilling tramadol and setting her up for another injection, requested was C7-T1 but space was not available to pass the needle so it was done at T1-T2, this seemed to provide good relief since January.  I spent 25 minutes with this patient, greater than 50% was face-to-face time counseling regarding the above diagnoses ___________________________________________ Gwen Her. [name], M.D., ABFM., CAQSM. Primary Care and [place] Instructor of [place] of [facility] of Medicine

## 2017-10-18 ENCOUNTER — Ambulatory Visit: Payer: 59 | Admitting: Obstetrics & Gynecology

## 2017-10-27 ENCOUNTER — Encounter: Payer: Self-pay | Admitting: Obstetrics & Gynecology

## 2017-10-27 ENCOUNTER — Ambulatory Visit: Payer: 59 | Admitting: Obstetrics & Gynecology

## 2017-10-27 ENCOUNTER — Other Ambulatory Visit: Payer: Self-pay | Admitting: Sports Medicine

## 2017-10-27 VITALS — BP 117/76 | HR 65 | Ht 62.0 in | Wt 150.0 lb

## 2017-10-27 DIAGNOSIS — E559 Vitamin D deficiency, unspecified: Secondary | ICD-10-CM

## 2017-10-27 DIAGNOSIS — N905 Atrophy of vulva: Secondary | ICD-10-CM | POA: Diagnosis not present

## 2017-10-27 DIAGNOSIS — N816 Rectocele: Secondary | ICD-10-CM | POA: Diagnosis not present

## 2017-10-27 DIAGNOSIS — Z903 Acquired absence of stomach [part of]: Secondary | ICD-10-CM

## 2017-10-27 MED ORDER — PRASTERONE 6.5 MG VA INST: 1.0000 | VAGINAL_INSERT | Freq: Every day | VAGINAL | 12 refills | Status: DC

## 2017-10-27 NOTE — Progress Notes (Signed)
   Subjective:    Patient ID: Sara Wolfe, female    DOB: 1961/01/06, 57 y.o.   MRN: [id number]  HPI 57 yo married lady here with vaginal bulge and pressure. This is especially bothersome to her when she is having a BM. She uses coconut oil for lubricant with sex but still finds it painful.   Review of Systems     Objective:   Physical Exam Breathing, conversing, and ambulating normally Well nourished, well hydrated White female, no apparent distress Grade 1 uterine prolapse, grade 2 rectocele SEVERE atrophy (She reports that she didn't like the vaginal estrogen cream prescribed last year)      Assessment & Plan:  Symptomatic rectocele with severe atrophy Start intrarosa nightly  Rec Kegels Come back 4 weeks for re check and to discuss pessary versus posterior repair

## 2017-10-28 ENCOUNTER — Other Ambulatory Visit: Payer: Self-pay

## 2017-10-28 ENCOUNTER — Other Ambulatory Visit: Payer: Self-pay | Admitting: Obstetrics & Gynecology

## 2017-10-28 LAB — VITAMIN D 25 HYDROXY (VIT D DEFICIENCY, FRACTURES): Vit D, 25-Hydroxy: 26 ng/mL — ABNORMAL LOW (ref 30–100)

## 2017-10-28 MED ORDER — VITAMIN D (ERGOCALCIFEROL) 1.25 MG (50000 UNIT) PO CAPS: 50000.0000 [IU] | ORAL_CAPSULE | ORAL | 0 refills | Status: DC

## 2017-10-28 NOTE — Progress Notes (Signed)
Vitamin D prescribed.

## 2017-10-31 ENCOUNTER — Ambulatory Visit
Admission: RE | Admit: 2017-10-31 | Discharge: 2017-10-31 | Disposition: A | Discharge status: Home / Self Care | Payer: 59 | Source: Ambulatory Visit | Attending: Sports Medicine | Admitting: Sports Medicine

## 2017-10-31 DIAGNOSIS — M542 Cervicalgia: Secondary | ICD-10-CM | POA: Diagnosis not present

## 2017-10-31 MED ORDER — IOPAMIDOL (ISOVUE-M 300) INJECTION 61%
1.0000 mL | Freq: Once | INTRAMUSCULAR | Status: AC | PRN
  Administered 2017-10-31: 1 mL via EPIDURAL

## 2017-10-31 MED ORDER — TRIAMCINOLONE ACETONIDE 40 MG/ML IJ SUSP (RADIOLOGY)
60.0000 mg | Freq: Once | INTRAMUSCULAR | Status: AC
  Administered 2017-10-31: 60 mg via EPIDURAL

## 2017-10-31 NOTE — Discharge Instructions (Signed)

## 2017-11-01 ENCOUNTER — Other Ambulatory Visit: Payer: Self-pay | Admitting: Sports Medicine

## 2017-11-01 DIAGNOSIS — M5416 Radiculopathy, lumbar region: Secondary | ICD-10-CM

## 2017-11-02 ENCOUNTER — Ambulatory Visit (INDEPENDENT_AMBULATORY_CARE_PROVIDER_SITE_OTHER): Payer: 59 | Admitting: Sports Medicine

## 2017-11-02 ENCOUNTER — Encounter: Payer: Self-pay | Admitting: Sports Medicine

## 2017-11-02 DIAGNOSIS — Z903 Acquired absence of stomach [part of]: Secondary | ICD-10-CM

## 2017-11-02 DIAGNOSIS — E538 Deficiency of other specified B group vitamins: Secondary | ICD-10-CM

## 2017-11-02 MED ORDER — CYANOCOBALAMIN 1000 MCG/ML IJ SOLN
1000.0000 ug | Freq: Once | INTRAMUSCULAR | Status: AC
Start: 2017-11-02 — End: 2017-11-02
  Administered 2017-11-02: 1000 ug via INTRAMUSCULAR

## 2017-11-02 MED ORDER — PHENTERMINE HCL 37.5 MG PO TABS: ORAL_TABLET | ORAL | 0 refills | Status: DC

## 2017-11-02 NOTE — Assessment & Plan Note (Signed)
Additional 3 to 4 pound weight loss, entering the sixth month. She self discontinued her Topamax due to cognitive difficulties. She is post sleeve gastrectomy. She started 251 pounds, down to 151 now. We will need a new picture of her for the chart. At the next visit I will drop her to a half tab and give her a 39-month supply.

## 2017-11-02 NOTE — Progress Notes (Signed)
Subjective:    CC: Weight check  HPI: Mercadies stopped her Topamax but continues phentermine, has lost another 3 to 4 pounds.  Vitamin B12 deficiency: B12 shot today, she is post sleeve gastrectomy.  I reviewed the past medical history, family history, social history, surgical history, and allergies today and no changes were needed.  Please see the problem list section below in epic for further details.  Past Medical History: Past Medical History:  Diagnosis Date  . GERD (gastroesophageal reflux disease)   . Hot flashes, menopausal   . Medical history non-contributory   . PONV (postoperative nausea and vomiting)    Past Surgical History: Past Surgical History:  Procedure Laterality Date  . ANTERIOR CERVICAL DECOMP/DISCECTOMY FUSION N/A 10/04/2013   Procedure: ANTERIOR CERVICAL DECOMPRESSION/DISCECTOMY FUSION 3 LEVELS;  Surgeon: Sinclair Ship, MD;  Location: [place];  Service: Orthopedics;  Laterality: N/A;  Anterior cervical decompression fusion cervical 4-5, cervical 5-6, cervical 6-7 with instrumentation and allograft  . ANTERIOR CERVICAL DECOMP/DISCECTOMY FUSION N/A 04/11/2014   Procedure: ANTERIOR CERVICAL DECOMPRESSION/DISCECTOMY FUSION 1 LEVEL;  Surgeon: Sinclair Ship, MD;  Location: [place];  Service: Orthopedics;  Laterality: N/A;  Anterior cervical decompression fusion, cervical 7-thoracic 1 with instrumentation and allograft  . FACIAL COSMETIC SURGERY    . FOOT SURGERY    . GASTRIC BYPASS    . TUBAL LIGATION     Social History: Social History   Socioeconomic History  . Marital status: Married    Spouse name: Not on file  . Number of children: 2  . Years of education: 28  . Highest education level: Not on file  Occupational History  . Occupation: [employer]  Social Needs  . Financial resource strain: Not on file  . Food insecurity:    Worry: Not on file    Inability: Not on file  . Transportation needs:    Medical: Not on file    Non-medical: Not on  file  Tobacco Use  . Smoking status: Light Tobacco Smoker    Packs/day: 0.25    Years: 2.00    Pack years: 0.50    Types: Cigarettes    Last attempt to quit: 04/02/2012    Years since quitting: 5.5  . Smokeless tobacco: Never Used  Substance and Sexual Activity  . Alcohol use: No  . Drug use: No  . Sexual activity: Yes    Birth control/protection: Post-menopausal  Lifestyle  . Physical activity:    Days per week: Not on file    Minutes per session: Not on file  . Stress: Not on file  Relationships  . Social connections:    Talks on phone: Not on file    Gets together: Not on file    Attends religious service: Not on file    Active member of club or organization: Not on file    Attends meetings of clubs or organizations: Not on file    Relationship status: Not on file  Other Topics Concern  . Not on file  Social History Narrative   Lives with husband   Caffeine use: tea daily   Right handed    Family History: Family History  Problem Relation Age of Onset  . Colon cancer Sister    Allergies: Allergies  Allergen Reactions  . Augmentin [Amoxicillin-Pot Clavulanate] Diarrhea   Medications: See med rec.  Review of Systems: No fevers, chills, night sweats, weight loss, chest pain, or shortness of breath.   Objective:    General: Well Developed, well  nourished, and in no acute distress.  Neuro: Alert and oriented x3, extra-ocular muscles intact, sensation grossly intact.  HEENT: Normocephalic, atraumatic, pupils equal round reactive to light, neck supple, no masses, no lymphadenopathy, thyroid nonpalpable.  Skin: Warm and dry, no rashes. Cardiac: Regular rate and rhythm, no murmurs rubs or gallops, no lower extremity edema.  Respiratory: Clear to auscultation bilaterally. Not using accessory muscles, speaking in full sentences.  Impression and Recommendations:    History of sleeve gastrectomy Additional 3 to 4 pound weight loss, entering the sixth month. She self  discontinued her Topamax due to cognitive difficulties. She is post sleeve gastrectomy. She started 251 pounds, down to 151 now. We will need a new picture of her for the chart. At the next visit I will drop her to a half tab and give her a 37-month supply.  Vitamin B 12 deficiency Monthly B12 shots, she did have a sleeve gastrectomy, B12 shot today.  ___________________________________________ Gwen Her. [name], M.D., ABFM., CAQSM. Primary Care and [place] Instructor of [place] [facility] [employer]

## 2017-11-02 NOTE — Addendum Note (Signed)
Addended by: Beatris Ship L on: 11/02/2017 01:53 PM   Modules accepted: Orders

## 2017-11-02 NOTE — Assessment & Plan Note (Signed)
Monthly B12 shots, she did have a sleeve gastrectomy, B12 shot today.

## 2017-11-20 ENCOUNTER — Other Ambulatory Visit: Payer: Self-pay | Admitting: Sports Medicine

## 2017-11-20 DIAGNOSIS — Z981 Arthrodesis status: Secondary | ICD-10-CM

## 2017-11-21 ENCOUNTER — Ambulatory Visit: Payer: Self-pay | Admitting: Sports Medicine

## 2017-11-24 ENCOUNTER — Encounter: Payer: Self-pay | Admitting: Obstetrics & Gynecology

## 2017-11-24 ENCOUNTER — Ambulatory Visit: Payer: 59 | Admitting: Obstetrics & Gynecology

## 2017-11-24 ENCOUNTER — Other Ambulatory Visit: Payer: Self-pay | Admitting: Sports Medicine

## 2017-11-24 VITALS — BP 102/67 | HR 77 | Resp 16 | Ht 62.0 in | Wt 147.0 lb

## 2017-11-24 DIAGNOSIS — N816 Rectocele: Secondary | ICD-10-CM

## 2017-11-24 DIAGNOSIS — R6882 Decreased libido: Secondary | ICD-10-CM | POA: Diagnosis not present

## 2017-11-24 DIAGNOSIS — N905 Atrophy of vulva: Secondary | ICD-10-CM | POA: Diagnosis not present

## 2017-11-24 DIAGNOSIS — M5416 Radiculopathy, lumbar region: Secondary | ICD-10-CM

## 2017-11-24 DIAGNOSIS — Z981 Arthrodesis status: Secondary | ICD-10-CM

## 2017-11-24 MED ORDER — TESTOSTERONE 1.62 % TD GEL: 1.0000 "application " | TRANSDERMAL | 5 refills | Status: DC

## 2017-11-24 NOTE — Progress Notes (Signed)
   Subjective:    Patient ID: Sara Wolfe, female    DOB: [birth year], 57 y.o.   MRN: [id number]  HPI 57 yo married lady here for follow up of her vaginal atrophy, dyspareunia, and rectocele. She has been using Intrarosa daily and has not even noticed the vaginal bulge that initially brought her here. She has not had sex since starting the Intrarosa as her husband doesn't want to hurt her. There are no relationship issues. She has not done her Kegels. She reports that she has a daily BM.  She reports that her libido went completely away after she had gastric bypass about 18 months ago. This disappoints her and her husband.   Review of Systems     Objective:   Physical Exam Breathing, conversing, and ambulating normally Well nourished, well hydrated White female, no apparent distress Good improvement of her atrophy, still some atrophy present Her rectocele has gone from a Grade 2 to a grade 1. Her uterine prolapse has gone from Grade 1 to 0.     Assessment & Plan:  She is now asymptomatic for the rectocele. I have rec'd that she continue Intrarosa and start [place]. With regard to her absent libido, I discussed Addyi versus testosterone cream. She would like to try the test.  Come back 1 month for annual/follow up

## 2017-11-26 ENCOUNTER — Other Ambulatory Visit: Payer: Self-pay | Admitting: Sports Medicine

## 2017-11-30 ENCOUNTER — Other Ambulatory Visit: Payer: Self-pay | Admitting: Sports Medicine

## 2017-12-05 ENCOUNTER — Ambulatory Visit: Payer: Self-pay | Admitting: Sports Medicine

## 2017-12-14 ENCOUNTER — Ambulatory Visit (INDEPENDENT_AMBULATORY_CARE_PROVIDER_SITE_OTHER): Payer: 59 | Admitting: Sports Medicine

## 2017-12-14 VITALS — BP 120/68 | HR 73 | Temp 98.7°F

## 2017-12-14 DIAGNOSIS — E538 Deficiency of other specified B group vitamins: Secondary | ICD-10-CM | POA: Diagnosis not present

## 2017-12-14 MED ORDER — CYANOCOBALAMIN 1000 MCG/ML IJ SOLN
1000.0000 ug | Freq: Once | INTRAMUSCULAR | Status: AC
  Administered 2017-12-14: 1000 ug via INTRAMUSCULAR

## 2017-12-14 NOTE — Progress Notes (Signed)
Pt in today for b-12 injection. Pt denies GI upset or Dizziness.Injection given in left deltoid.Pt. Tolerated well no reaction noted.

## 2018-01-02 ENCOUNTER — Encounter: Payer: Self-pay | Admitting: Sports Medicine

## 2018-01-02 ENCOUNTER — Ambulatory Visit: Payer: 59 | Admitting: Sports Medicine

## 2018-01-02 ENCOUNTER — Ambulatory Visit: Payer: Self-pay | Admitting: Sports Medicine

## 2018-01-02 DIAGNOSIS — R1011 Right upper quadrant pain: Secondary | ICD-10-CM | POA: Diagnosis not present

## 2018-01-02 DIAGNOSIS — D2371 Other benign neoplasm of skin of right lower limb, including hip: Secondary | ICD-10-CM | POA: Diagnosis not present

## 2018-01-02 DIAGNOSIS — D2372 Other benign neoplasm of skin of left lower limb, including hip: Secondary | ICD-10-CM | POA: Diagnosis not present

## 2018-01-02 NOTE — Assessment & Plan Note (Signed)
Never got her HIDA scan done. Ordering this and ultrasound now.

## 2018-01-02 NOTE — Progress Notes (Signed)
Subjective:    CC: Abdominal pain  HPI: Sara Wolfe returns, she is a pleasant 57 year old female with right upper quadrant and epigastric colicky pain.  Worse with eating greasy foods.  We discussed this back in 2018, I ordered a HIDA scan which she never got done.  Now having persistent discomfort, no dark or tarry stools, no hematemesis.  She was having which she describes biliary emesis back in 2018.  I reviewed the past medical history, family history, social history, surgical history, and allergies today and no changes were needed.  Please see the problem list section below in epic for further details.  Past Medical History: Past Medical History:  Diagnosis Date  . GERD (gastroesophageal reflux disease)   . Hot flashes, menopausal   . Medical history non-contributory   . PONV (postoperative nausea and vomiting)    Past Surgical History: Past Surgical History:  Procedure Laterality Date  . ANTERIOR CERVICAL DECOMP/DISCECTOMY FUSION N/A 10/04/2013   Procedure: ANTERIOR CERVICAL DECOMPRESSION/DISCECTOMY FUSION 3 LEVELS;  Surgeon: Sinclair Ship, MD;  Location: [place];  Service: Orthopedics;  Laterality: N/A;  Anterior cervical decompression fusion cervical 4-5, cervical 5-6, cervical 6-7 with instrumentation and allograft  . ANTERIOR CERVICAL DECOMP/DISCECTOMY FUSION N/A 04/11/2014   Procedure: ANTERIOR CERVICAL DECOMPRESSION/DISCECTOMY FUSION 1 LEVEL;  Surgeon: Sinclair Ship, MD;  Location: [place];  Service: Orthopedics;  Laterality: N/A;  Anterior cervical decompression fusion, cervical 7-thoracic 1 with instrumentation and allograft  . FACIAL COSMETIC SURGERY    . FOOT SURGERY    . GASTRIC BYPASS    . TUBAL LIGATION     Social History: Social History   Socioeconomic History  . Marital status: Married    Spouse name: Not on file  . Number of children: 2  . Years of education: 23  . Highest education level: Not on file  Occupational History  . Occupation: [employer]    Social Needs  . Financial resource strain: Not on file  . Food insecurity:    Worry: Not on file    Inability: Not on file  . Transportation needs:    Medical: Not on file    Non-medical: Not on file  Tobacco Use  . Smoking status: Light Tobacco Smoker    Packs/day: 0.25    Years: 2.00    Pack years: 0.50    Types: Cigarettes    Last attempt to quit: 04/02/2012    Years since quitting: 5.7  . Smokeless tobacco: Never Used  Substance and Sexual Activity  . Alcohol use: No  . Drug use: No  . Sexual activity: Yes    Birth control/protection: Post-menopausal  Lifestyle  . Physical activity:    Days per week: Not on file    Minutes per session: Not on file  . Stress: Not on file  Relationships  . Social connections:    Talks on phone: Not on file    Gets together: Not on file    Attends religious service: Not on file    Active member of club or organization: Not on file    Attends meetings of clubs or organizations: Not on file    Relationship status: Not on file  Other Topics Concern  . Not on file  Social History Narrative   Lives with husband   Caffeine use: tea daily   Right handed    Family History: Family History  Problem Relation Age of Onset  . Colon cancer Sister    Allergies: Allergies  Allergen Reactions  .  Augmentin [Amoxicillin-Pot Clavulanate] Diarrhea   Medications: See med rec.  Review of Systems: No fevers, chills, night sweats, weight loss, chest pain, or shortness of breath.   Objective:    General: Well Developed, well nourished, and in no acute distress.  Neuro: Alert and oriented x3, extra-ocular muscles intact, sensation grossly intact.  HEENT: Normocephalic, atraumatic, pupils equal round reactive to light, neck supple, no masses, no lymphadenopathy, thyroid nonpalpable.  Skin: Warm and dry, no rashes. Cardiac: Regular rate and rhythm, no murmurs rubs or gallops, no lower extremity edema.  Respiratory: Clear to auscultation  bilaterally. Not using accessory muscles, speaking in full sentences.  Impression and Recommendations:    Abdominal pain, right upper quadrant Never got her HIDA scan done. Ordering this and ultrasound now.  I spent 25 minutes with this patient, greater than 50% was face-to-face time counseling regarding the above diagnoses, specifically discussing the treatment plan and filling out FMLA paperwork. ___________________________________________ Gwen Her. [name], M.D., ABFM., CAQSM. Primary Care and Sports Medicine [facility]  Adjunct Professor of [place] [employer] [facility] [employer]

## 2018-01-03 ENCOUNTER — Other Ambulatory Visit: Payer: Self-pay | Admitting: Sports Medicine

## 2018-01-03 DIAGNOSIS — M5416 Radiculopathy, lumbar region: Secondary | ICD-10-CM

## 2018-01-05 ENCOUNTER — Ambulatory Visit (INDEPENDENT_AMBULATORY_CARE_PROVIDER_SITE_OTHER): Payer: 59

## 2018-01-05 DIAGNOSIS — R1011 Right upper quadrant pain: Secondary | ICD-10-CM | POA: Diagnosis not present

## 2018-01-05 DIAGNOSIS — R1010 Upper abdominal pain, unspecified: Secondary | ICD-10-CM | POA: Diagnosis not present

## 2018-01-06 ENCOUNTER — Other Ambulatory Visit (HOSPITAL_COMMUNITY): Payer: Self-pay | Admitting: Obstetrics & Gynecology

## 2018-01-06 DIAGNOSIS — Z1239 Encounter for other screening for malignant neoplasm of breast: Secondary | ICD-10-CM

## 2018-01-10 ENCOUNTER — Ambulatory Visit (INDEPENDENT_AMBULATORY_CARE_PROVIDER_SITE_OTHER): Payer: 59 | Admitting: Advanced Practice Midwife

## 2018-01-10 ENCOUNTER — Encounter (HOSPITAL_COMMUNITY)
Admission: RE | Admit: 2018-01-10 | Discharge: 2018-01-10 | Disposition: A | Discharge status: Home / Self Care | Payer: 59 | Source: Ambulatory Visit | Attending: Sports Medicine | Admitting: Sports Medicine

## 2018-01-10 ENCOUNTER — Encounter: Payer: Self-pay | Admitting: Advanced Practice Midwife

## 2018-01-10 ENCOUNTER — Ambulatory Visit (INDEPENDENT_AMBULATORY_CARE_PROVIDER_SITE_OTHER): Payer: 59 | Admitting: Sports Medicine

## 2018-01-10 VITALS — BP 115/76 | HR 80

## 2018-01-10 VITALS — BP 117/75 | HR 86 | Resp 16 | Ht 62.0 in | Wt 143.0 lb

## 2018-01-10 DIAGNOSIS — Z124 Encounter for screening for malignant neoplasm of cervix: Secondary | ICD-10-CM | POA: Diagnosis not present

## 2018-01-10 DIAGNOSIS — E538 Deficiency of other specified B group vitamins: Secondary | ICD-10-CM | POA: Diagnosis not present

## 2018-01-10 DIAGNOSIS — N816 Rectocele: Secondary | ICD-10-CM

## 2018-01-10 DIAGNOSIS — Z1151 Encounter for screening for human papillomavirus (HPV): Secondary | ICD-10-CM

## 2018-01-10 DIAGNOSIS — R1011 Right upper quadrant pain: Secondary | ICD-10-CM | POA: Diagnosis present

## 2018-01-10 DIAGNOSIS — R6882 Decreased libido: Secondary | ICD-10-CM

## 2018-01-10 DIAGNOSIS — Z01419 Encounter for gynecological examination (general) (routine) without abnormal findings: Secondary | ICD-10-CM | POA: Diagnosis not present

## 2018-01-10 MED ORDER — CYANOCOBALAMIN 1000 MCG/ML IJ SOLN
1000.0000 ug | Freq: Once | INTRAMUSCULAR | Status: AC
  Administered 2018-01-10: 1000 ug via INTRAMUSCULAR

## 2018-01-10 MED ORDER — TECHNETIUM TC 99M MEBROFENIN IV KIT
5.0000 | PACK | Freq: Once | INTRAVENOUS | Status: AC | PRN
  Administered 2018-01-10: 5 via INTRAVENOUS

## 2018-01-10 NOTE — Progress Notes (Signed)
Subjective:     Sara Wolfe is a 57 y.o. female here for a routine exam.  Current complaints: vaginal dryness, decreased sex drive, painful sex every time.  She started treatment with progesterone cream after seeing Dr Hulan Fray but this medicine is messy so she wants to do something different.  She also reports a bulge in her rectal area when having a bowel movement.    She denies any constipation, diarrhea, or urinary symptoms. There is no incontinence.  Personal health questionnaire reviewed: yes.   Gynecologic History No LMP recorded. Patient is postmenopausal. Contraception: none Last Pap: 2017. Results were: normal Last mammogram: 2018. Results were: normal  Obstetric History OB History  Gravida Para Term Preterm AB Living  2 2 2     2   SAB TAB Ectopic Multiple Live Births               # Outcome Date GA Lbr Len/2nd Weight Sex Delivery Anes PTL Lv  2 Term           1 Term              The following portions of the patient's history were reviewed and updated as appropriate: allergies, current medications, past family history, past medical history, past social history, past surgical history and problem list.  Review of Systems Pertinent items noted in HPI and remainder of comprehensive ROS otherwise negative.    Objective:   BP 117/75   Pulse 86   Resp 16   Ht 5\' 2"  (1.575 m)   Wt 64.9 kg   BMI 26.16 kg/m    VS reviewed, nursing note reviewed,  Constitutional: well developed, well nourished, no distress HEENT: normocephalic CV: normal rate Pulm/chest wall: normal effort Breast Exam:  right breast normal without mass, skin or nipple changes or axillary nodes, left breast normal without mass, skin or nipple changes or axillary nodes Abdomen: soft Neuro: alert and oriented x 3 Skin: warm, dry Psych: affect normal Pelvic exam: Cervix pink, visually closed, without lesion, scant white creamy discharge, vaginal walls and external genitalia normal. Pt does have large bulge of  pelvic floor with valsalva.  Bimanual exam: Cervix 0/long/high, firm, anterior, neg CMT, no palpable vaginal abnormalities on exam, uterus nontender, nonenlarged, adnexa without tenderness, enlargement, or mass  Assessment/Plan:   1. Well woman exam with routine gynecological exam --Pt doing well.  Rectocele noted on exam.  No incontinence or other urinary or stool problems.  There is pain with intercourse. --Pt to f/u with Dr Hulan Fray or other OB/Gyn for surgical consult. --Referral to physical therapy for pelvic floor - Cytology - PAP( [place])  2. Rectocele   3. Decreased libido --Pt may benefit from new medications on the market for libido for women.  I am less familiar with these so message sent to Dr Hulan Fray to consult.  Will contact pt with options to treat.  Fatima Blank, CNM 2:34 PM

## 2018-01-10 NOTE — Progress Notes (Signed)
Pt came into clinic today for B12 injection. Reports no negative side effects. Denies chest pain or palpitations. Pt does question when she should have her labs rechecked, states she doesn't notice an increase in her energy level. Tolerated injection in right deltoid well, no immediate side effects. Advised to follow up in 1 month.

## 2018-01-11 ENCOUNTER — Ambulatory Visit (INDEPENDENT_AMBULATORY_CARE_PROVIDER_SITE_OTHER): Payer: 59

## 2018-01-11 DIAGNOSIS — R6882 Decreased libido: Secondary | ICD-10-CM | POA: Insufficient documentation

## 2018-01-11 DIAGNOSIS — Z1239 Encounter for other screening for malignant neoplasm of breast: Secondary | ICD-10-CM

## 2018-01-11 DIAGNOSIS — Z1231 Encounter for screening mammogram for malignant neoplasm of breast: Secondary | ICD-10-CM | POA: Diagnosis not present

## 2018-01-11 DIAGNOSIS — N816 Rectocele: Secondary | ICD-10-CM | POA: Insufficient documentation

## 2018-01-12 ENCOUNTER — Ambulatory Visit: Payer: Self-pay

## 2018-01-12 ENCOUNTER — Ambulatory Visit: Payer: Self-pay | Admitting: Family Medicine

## 2018-01-12 LAB — CYTOLOGY - PAP
ADEQUACY: ABSENT
DIAGNOSIS: NEGATIVE
HPV: NOT DETECTED

## 2018-01-23 ENCOUNTER — Telehealth: Payer: Self-pay

## 2018-01-23 DIAGNOSIS — R1011 Right upper quadrant pain: Secondary | ICD-10-CM

## 2018-01-23 NOTE — Telephone Encounter (Signed)
Patient advised.

## 2018-01-23 NOTE — Telephone Encounter (Signed)
Sara Wolfe has agreed to go see a Psychologist, sport and exercise for the gallbladder issue. Please advise.

## 2018-01-23 NOTE — Telephone Encounter (Signed)
Referral placed.

## 2018-01-24 ENCOUNTER — Encounter: Payer: Self-pay | Admitting: *Deleted

## 2018-01-28 ENCOUNTER — Emergency Department
Admission: EM | Admit: 2018-01-28 | Discharge: 2018-01-28 | Disposition: A | Discharge status: Home / Self Care | Payer: 59 | Source: Home / Self Care | Attending: Family Medicine | Admitting: Family Medicine

## 2018-01-28 ENCOUNTER — Other Ambulatory Visit: Payer: Self-pay

## 2018-01-28 DIAGNOSIS — Z0000 Encounter for general adult medical examination without abnormal findings: Secondary | ICD-10-CM

## 2018-01-28 NOTE — ED Triage Notes (Signed)
Pt c/o pulsating headache since Thurs. Also c/o dizziness and sometimes a pulsating/numbing feeling in face. No OTC meds tried. Pain 6/10

## 2018-01-28 NOTE — Discharge Instructions (Addendum)
If symptoms become significantly worse during the night or over the weekend, proceed to the local emergency room.  

## 2018-01-28 NOTE — ED Provider Notes (Signed)
[name]    CSN: [id number] Arrival date & time: 01/28/18  1128     History   Chief Complaint Chief Complaint  Patient presents with  . Headache    HPI Satia Winger is a 57 y.o. female.   Two days ago patient developed a vague sensation of numbness and flushing in her face that lasted about 10 minutes.  Associated with this was a pulsating tinnitus like sensation in her head that she has not had before (she states that she normally has a low level of chronic tinnitus). She also had a transitory numbness in her right hand without loss of strength.  Patient has a history of chronic migraines that had been controlled with Topamax, but she discontinued about 3 months ago because of adverse effects.  She reports several brief episodes of dis-equilibrium (not quite vertigo) that resolved spontaneously. Patient is s/p cervical spinal fusion with radiculopathy chronic pain in a C8 distribution, generally controlled with Lyrica.  The history is provided by the patient.    Past Medical History:  Diagnosis Date  . GERD (gastroesophageal reflux disease)   . Hot flashes, menopausal   . Medical history non-contributory   . PONV (postoperative nausea and vomiting)     Patient Active Problem List   Diagnosis Date Noted  . Rectocele 01/11/2018  . Decreased libido 01/11/2018  . Eyelash scantiness 08/08/2017  . History of facelift 06/13/2017  . Memory change 02/07/2017  . Dysarthria 01/17/2017  . Vitamin B 12 deficiency 12/17/2016  . Abdominal pain, right upper quadrant 08/19/2016  . Chronic pain syndrome 10/16/2015  . Hyperlipidemia 05/20/2015  . Migraine headache 05/16/2015  . Obstructive sleep apnea 05/16/2015  . Lateral epicondylitis of left elbow 05/09/2015  . Left lumbar radiculitis 04/11/2014  . Annual physical exam 02/21/2014  . 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 S/P cervical spinal fusion 10/04/2013  . Hot flash, menopausal 12/27/2011    Past  Surgical History:  Procedure Laterality Date  . ANTERIOR CERVICAL DECOMP/DISCECTOMY FUSION N/A 10/04/2013   Procedure: ANTERIOR CERVICAL DECOMPRESSION/DISCECTOMY FUSION 3 LEVELS;  Surgeon: Sinclair Ship, MD;  Location: [place];  Service: Orthopedics;  Laterality: N/A;  Anterior cervical decompression fusion cervical 4-5, cervical 5-6, cervical 6-7 with instrumentation and allograft  . ANTERIOR CERVICAL DECOMP/DISCECTOMY FUSION N/A 04/11/2014   Procedure: ANTERIOR CERVICAL DECOMPRESSION/DISCECTOMY FUSION 1 LEVEL;  Surgeon: Sinclair Ship, MD;  Location: [place];  Service: Orthopedics;  Laterality: N/A;  Anterior cervical decompression fusion, cervical 7-thoracic 1 with instrumentation and allograft  . FACIAL COSMETIC SURGERY    . FOOT SURGERY    . GASTRIC BYPASS    . TUBAL LIGATION      OB History    Gravida  2   Para  2   Term  2   Preterm      AB      Living  2     SAB      TAB      Ectopic      Multiple      Live Births               Home Medications    Prior to Admission medications   Medication Sig Start Date End Date Taking? Authorizing Provider  acetaminophen (TYLENOL) 650 MG CR tablet Take 1 tablet (650 mg total) by mouth every 8 (eight) hours as needed for pain. 02/11/17   Silverio Decamp, MD  AMBULATORY NON FORMULARY MEDICATION Continuous positive airway pressure (CPAP)  machine autopap 5-21 cm of H2O pressure (or autoPAP if available), with all supplemental supplies as needed.  To [employer]. AHI=15 12/16/16   Silverio Decamp, MD  bimatoprost (LATISSE) 0.03 % ophthalmic solution Place 1gtt on applicator and apply evenly along the skin of the upper eyelid at base of eyelashes qhs; use new applicator for 2nd eye 6/31/49   Silverio Decamp, MD  cyanocobalamin (,VITAMIN B-12,) 1000 MCG/ML injection Inject 1000 mcg weekly intramuscular for 1 month then monthly thereafter 12/17/16   Silverio Decamp, MD  DULoxetine (CYMBALTA) 60 MG  capsule TAKE 1 CAPSULE BY MOUTH EVERY DAY 11/24/17   Silverio Decamp, MD  folic acid (FOLVITE) 1 MG tablet TAKE 5 TABLETS (5 MG TOTAL) BY MOUTH DAILY. 02/21/17   Silverio Decamp, MD  gabapentin (NEURONTIN) 800 MG tablet TAKE 1 TABLET BY MOUTH THREE TIMES A DAY 05/30/17   Silverio Decamp, MD  methocarbamol (ROBAXIN) 500 MG tablet TAKE 1 TABLET BY MOUTH THREE TIMES A DAY 12/01/17   Silverio Decamp, MD  Prasterone (INTRAROSA) 6.5 MG INST Place 1 applicator vaginally at bedtime. 10/27/17   Emily Filbert, MD  pregabalin (LYRICA) 200 MG capsule TAKE 1 CAPSULE BY MOUTH 3 TIMES A DAY 11/28/17   Silverio Decamp, MD  rizatriptan (MAXALT-MLT) 10 MG disintegrating tablet TAKE 1 TABLET (10 MG TOTAL) BY MOUTH AS NEEDED FOR MIGRAINE. MAY REPEAT IN 2 HOURS IF NEEDED 02/21/16   Silverio Decamp, MD  Testosterone 1.62 % GEL Place 1 application onto the skin every other day. 11/24/17   Emily Filbert, MD  traMADol (ULTRAM) 50 MG tablet TAKE 1 TABLET BY MOUTH EVERY 8 HOURS AS NEEDED FOR PAIN 01/03/18   Silverio Decamp, MD  Vitamin D, Ergocalciferol, (DRISDOL) 50000 units CAPS capsule Take 1 capsule (50,000 Units total) by mouth every 7 (seven) days. 10/28/17   Emily Filbert, MD    Family History Family History  Problem Relation Age of Onset  . Colon cancer Sister     Social History Social History   Tobacco Use  . Smoking status: Light Tobacco Smoker    Packs/day: 0.25    Years: 2.00    Pack years: 0.50    Types: Cigarettes    Last attempt to quit: 04/02/2012    Years since quitting: 5.8  . Smokeless tobacco: Never Used  Substance Use Topics  . Alcohol use: No  . Drug use: No     Allergies   Augmentin [amoxicillin-pot clavulanate]   Review of Systems Review of Systems  Constitutional: Positive for fatigue. Negative for activity change, appetite change, chills, diaphoresis and fever.  HENT: Positive for tinnitus. Negative for congestion, ear discharge, ear pain,  facial swelling, hearing loss, rhinorrhea, sinus pain, sore throat, trouble swallowing and voice change.   Eyes: Negative.   Respiratory: Negative.   Cardiovascular: Negative.   Gastrointestinal: Negative.   Genitourinary: Negative.   Musculoskeletal: Negative.   Skin: Negative.   Neurological: Positive for dizziness, numbness and headaches. Negative for tremors, seizures, syncope, facial asymmetry, speech difficulty, weakness and light-headedness.  Hematological: Negative for adenopathy.     Physical Exam Triage Vital Signs ED Triage Vitals [01/28/18 1203]  Enc Vitals Group     BP 137/72     Pulse Rate 66     Resp 18     Temp 98.2 F (36.8 C)     Temp Source Oral     SpO2 98 %     Weight  148 lb (67.1 kg)     Height 5\' 2"  (1.575 m)     Head Circumference      Peak Flow      Pain Score 6     Pain Loc      Pain Edu?      Excl. in [place]?    No data found.  Updated Vital Signs BP 137/72 (BP Location: Right Arm)   Pulse 66   Temp 98.2 F (36.8 C) (Oral)   Resp 18   Ht 5\' 2"  (1.575 m)   Wt 67.1 kg   SpO2 98%   BMI 27.07 kg/m   Visual Acuity Right Eye Distance:   Left Eye Distance:   Bilateral Distance:    Right Eye Near:   Left Eye Near:    Bilateral Near:     Physical Exam Nursing notes and Vital Signs reviewed. Appearance:  Patient appears stated age, and in no acute distress Eyes:  Pupils are equal, round, and reactive to light and accomodation.  Extraocular movement is intact.  Conjunctivae are not inflamed.  Fundi are benign.  Ears:  Canals normal.  Tympanic membranes normal.  Nose:   Normal turbinates.  No sinus tenderness.   Pharynx:  Normal; tongue midline. Neck:  Supple.  No adenopathy or thyromegaly.  Carotids have normal upstrokes without bruits.  Lungs:  Clear to auscultation.  Breath sounds are equal.  Moving air well. Heart:  Regular rate and rhythm without murmurs, rubs, or gallops.  Abdomen:  Nontender without masses or hepatosplenomegaly.   Bowel sounds are present.  No CVA or flank tenderness.  Extremities:  No edema or lower leg tenderness  Skin:  No rash present.   Neurologic:  Cranial nerves 2 through 12 are normal.  Patellar, achilles, and elbow reflexes are normal.  Cerebellar function is intact (finger-to-nose and rapid alternating hand movement).  No pronator drift.  Gait and station are normal.  Grip strength symmetric bilaterally.  Romberg negative.   UC Treatments / Results  Labs (all labs ordered are listed, but only abnormal results are displayed) Labs Reviewed - No data to display  EKG None  Radiology No results found.  Procedures Procedures (including critical care time)  Medications Ordered in UC Medications - No data to display  Initial Impression / Assessment and Plan / UC Course  I have reviewed the triage vital signs and the nursing notes.  Pertinent labs & imaging results that were available during my care of the patient were reviewed by me and considered in my medical decision making (see chart for details).    Symptoms somewhat suggestive of TIA's, but patient's completely normal neurologic exam reassuring. Suspect that her hand paresthesias are a result of her cervical radiculopathy. Recommend follow-up with PCP in 3 to 4 days.   Final Clinical Impressions(s) / UC Diagnoses   Final diagnoses:  Normal neurological exam     Discharge Instructions     If symptoms become significantly worse during the night or over the weekend, proceed to the local emergency room.     ED Prescriptions    None         Kandra Nicolas, MD 01/28/18 1537

## 2018-01-29 ENCOUNTER — Telehealth: Payer: Self-pay

## 2018-01-29 NOTE — Telephone Encounter (Signed)
Pt states she is feeling better today. Advised to f/u with pcp if sxs return.

## 2018-02-01 ENCOUNTER — Ambulatory Visit: Payer: 59 | Admitting: Physical Therapy

## 2018-02-02 ENCOUNTER — Other Ambulatory Visit: Payer: Self-pay | Admitting: Sports Medicine

## 2018-02-02 MED ORDER — PREGABALIN 200 MG PO CAPS: 200.0000 mg | ORAL_CAPSULE | Freq: Three times a day (TID) | ORAL | 1 refills | Status: DC

## 2018-02-08 ENCOUNTER — Ambulatory Visit: Payer: Self-pay

## 2018-02-10 ENCOUNTER — Ambulatory Visit: Payer: Self-pay | Admitting: General Surgery

## 2018-02-10 DIAGNOSIS — R1013 Epigastric pain: Secondary | ICD-10-CM | POA: Diagnosis not present

## 2018-02-10 DIAGNOSIS — K828 Other specified diseases of gallbladder: Secondary | ICD-10-CM | POA: Diagnosis not present

## 2018-02-10 DIAGNOSIS — Z9884 Bariatric surgery status: Secondary | ICD-10-CM | POA: Diagnosis not present

## 2018-02-11 ENCOUNTER — Ambulatory Visit: Payer: Self-pay | Admitting: General Surgery

## 2018-02-11 NOTE — H&P (Signed)
Sara Wolfe Documented: 02/10/2018 1:31 PM Location: [facility] Patient #: [id number] DOB: 11-16-1960 Married / Language: Sara Wolfe / Race: White Female  History of Present Illness [name] MD; 02/11/2018 2:28 PM) The patient is a 56 year old female who presents with abdominal pain. She is referred by Dr [name] to discuss gallbladder disease. She underwent "sleeve gastric bypass" at [facility] in 02/2016. She states for over several months now she has been having colicky right upper quadrant and epigastric pain generally after eating meals. She describes it as getting sick with eating. It doesn't occur with every food. She will generally have nausea with it. It'll generally occur within 30 minutes of eating and last for an hour. She denies any fever or chills or vomiting. This is a different type of discomfort then dumping which she has had in the past. Occasionally she will also have episodes of abdominal pain along with sweating, heart racing, and diarrhea within 1 hour of eating. She states that she is taking her multivitamin is irregularly and is not taking calcium. She is getting monthly B12 injection. She has noticed a pattern of the epigastric and right upper quadrant pain with salads, sour cream and BLTs. She denies any tobacco use. She may take ibuprofen once in a while but not regularly. She last saw her bariatric surgeon about 11 months ago. An ultrasound which demonstrated no evidence of gallstones or wall thickening. Common bile duct was normal. She underwent a nuclear medicine scan which showed visualization of the gallbladder after 1 hour. Her gallbladder ejection fraction was 40%   Problem List/Past Medical Randall Hiss M. Redmond Pulling, MD; 02/11/2018 2:32 PM) BARIATRIC SURGERY STATUS [phone]) s/p sleeve-gastric bypass at [facility], 02/2017; preop weight 246 lbs BILIARY DYSKINESIA (K82.8) EPIGASTRIC PAIN (R10.13)  Past Surgical History Randall Hiss M. Redmond Pulling, MD;  02/11/2018 2:20 PM) Foot Surgery Bilateral. Gastric Bypass Spinal Surgery - Neck  Diagnostic Studies History Randall Hiss M. Redmond Pulling, MD; 02/11/2018 2:20 PM) Mammogram within last year Pap Smear 1-5 years ago  Allergies ([name], [place]; 02/10/2018 1:33 PM) No Known Drug Allergies [02/10/2018]: Allergies Reconciled  Medication History Randall Hiss M. Redmond Pulling, MD; 02/11/2018 2:32 PM) traMADol HCl (50MG  Tablet, Oral) Active. Lyrica (200MG  Capsule, Oral) Active. Methocarbamol (500MG  Tablet, Oral) Active. Medications Reconciled Carafate (1GM Tablet, 1 (one) Oral before every meal, Taken starting 02/10/2018) Active. (crush and mix with water) Protonix (40MG  Tablet DR, 1 (one) Oral two times daily, Taken starting 02/10/2018) Active. (crush and mix with water)  Social History Randall Hiss M. Redmond Pulling, MD; 02/11/2018 2:20 PM) Caffeine use Tea. Illicit drug use Remotely quit drug use. No alcohol use Tobacco use Current some day smoker.  Family History Randall Hiss M. Redmond Pulling, MD; 02/11/2018 2:20 PM) Arthritis Father, Mother. Colon Cancer Sister. Colon Polyps Mother. Hypertension Father. Migraine Headache Daughter.  Pregnancy / Birth History Randall Hiss M. Redmond Pulling, MD; 02/11/2018 2:20 PM) Age at menarche 1 years. Age of menopause 41-50 Gravida 2 Length (months) of breastfeeding 3-6 Maternal age 110-20 Para 2  Other Problems Randall Hiss M. Redmond Pulling, MD; 02/11/2018 2:32 PM) Arthritis Back Pain Gastroesophageal Reflux Disease Hemorrhoids Migraine Headache Sleep Apnea     Review of Systems Randall Hiss M. [name] MD; 02/11/2018 2:20 PM) General Not Present- Appetite Loss, Chills, Fatigue, Fever, Night Sweats, Weight Gain and Weight Loss. Skin Not Present- Change in Wart/Mole, Dryness, Hives, Jaundice, New Lesions, Non-Healing Wounds, Rash and Ulcer. HEENT Present- Ringing in the Ears and Wears glasses/contact lenses. Not Present- Earache, Hearing Loss, Hoarseness, Nose Bleed, Oral Ulcers,  Seasonal  Allergies, Sinus Pain, Sore Throat, Visual Disturbances and Yellow Eyes. Respiratory Present- Snoring. Not Present- Bloody sputum, Chronic Cough, Difficulty Breathing and Wheezing. Gastrointestinal Present- Abdominal Pain, Bloating, Change in Bowel Habits, Excessive gas, Gets full quickly at meals, Hemorrhoids, Indigestion and Nausea. Not Present- Bloody Stool, Chronic diarrhea, Constipation, Difficulty Swallowing, Rectal Pain and Vomiting. Female Genitourinary Not Present- Frequency, Nocturia, Painful Urination, Pelvic Pain and Urgency. Musculoskeletal Present- Back Pain. Not Present- Joint Pain, Joint Stiffness, Muscle Pain, Muscle Weakness and Swelling of Extremities. Neurological Present- Headaches and Numbness. Not Present- Decreased Memory, Fainting, Seizures, Tingling, Tremor, Trouble walking and Weakness. Psychiatric Not Present- Anxiety, Bipolar, Change in Sleep Pattern, Depression, Fearful and Frequent crying. Endocrine Not Present- Cold Intolerance, Excessive Hunger, Hair Changes, Heat Intolerance, Hot flashes and New Diabetes. Hematology Present- Easy Bruising. Not Present- Blood Thinners, Excessive bleeding, Gland problems, HIV and Persistent Infections.  Vitals (Tanisha A. Brown RMA; 02/10/2018 1:33 PM) 02/10/2018 1:32 PM Weight: 152.2 lb Height: 62in Body Surface Area: 1.7 m Body Mass Index: 27.84 kg/m  Temp.: 98.68F  Pulse: 73 (Regular)  BP: 122/64 (Sitting, Left Arm, Standard)      Physical Exam Randall Hiss M. Kale Rondeau MD; 02/11/2018 2:29 PM)  General Mental Status-Alert. General Appearance-Consistent with stated age. Hydration-Well hydrated. Voice-Normal. Note: overweight  Head and Neck Head-normocephalic, atraumatic with no lesions or palpable masses. Trachea-midline. Thyroid Gland Characteristics - normal size and consistency.  Eye Eyeball - Bilateral-Extraocular movements intact. Sclera/Conjunctiva - Bilateral-No scleral  icterus.  ENMT Ears -Note:nml ext ears.  Mouth and Throat -Note:lips intact.   Chest and Lung Exam Chest and lung exam reveals -quiet, even and easy respiratory effort with no use of accessory muscles and on auscultation, normal breath sounds, no adventitious sounds and normal vocal resonance. Inspection Chest Wall - Normal. Back - normal.  Breast - Did not examine.  Cardiovascular Cardiovascular examination reveals -normal heart sounds, regular rate and rhythm with no murmurs and normal pedal pulses bilaterally.  Abdomen Inspection Inspection of the abdomen reveals - No Hernias. Skin - Scar - no surgical scars. Palpation/Percussion Palpation and Percussion of the abdomen reveal - Soft, Non Tender, No Rebound tenderness, No Rigidity (guarding) and No hepatosplenomegaly. Auscultation Auscultation of the abdomen reveals - Bowel sounds normal. Note: chaperone present - Tanisha  Peripheral Vascular Upper Extremity Palpation - Pulses bilaterally normal.  Neurologic Neurologic evaluation reveals -alert and oriented x 3 with no impairment of recent or remote memory. Mental Status-Normal.  Neuropsychiatric The patient's mood and affect are described as -normal. Judgment and Insight-insight is appropriate concerning matters relevant to self.  Musculoskeletal Normal Exam - Left-Upper Extremity Strength Normal and Lower Extremity Strength Normal. Normal Exam - Right-Upper Extremity Strength Normal and Lower Extremity Strength Normal.  Lymphatic Head & Neck  General Head & Neck Lymphatics: Bilateral - Description - Normal. Axillary - Did not examine. Femoral & Inguinal - Did not examine.    Assessment & Plan Randall Hiss M. Alok Minshall MD; 02/11/2018 2:32 PM)  BILIARY DYSKINESIA (K82.8) Impression: I believe some of the patient's symptoms are consistent with gallbladder disease.  We discussed gallbladder disease. The patient was given Neurosurgeon. We  discussed non-operative and operative management. We discussed the signs & symptoms of acute cholecystitis  I discussed laparoscopic cholecystectomy with IOC in detail. The patient was given educational material as well as diagrams detailing the procedure. We discussed the risks and benefits of a laparoscopic cholecystectomy including, but not limited to bleeding, infection, injury to surrounding structures such as the intestine or liver, bile  leak, retained gallstones, need to convert to an open procedure, prolonged diarrhea, blood clots such as DVT, common bile duct injury, anesthesia risks, and possible need for additional procedures. We discussed the typical post-operative recovery course. I explained that the likelihood of improvement of their symptoms is fair  The patient has elected to proceed with surgery.  Current Plans Pt Education - Pamphlet Given - Laparoscopic Gallbladder Surgery: discussed with patient and provided information.  EPIGASTRIC PAIN (R10.13) Impression: We discussed that her abdominal issues may be multifactorial. I do believe a component of her symptoms is probably attributable to biliary dyskinesia. But we also discussed the possibility of a marginal ulcer especially she has some episodes of upper abdominal discomfort that occur immediately to 10 minutes after eating. We discussed that cholecystectomy may not ameliorate all of her discomfort. We discussed how marginal ulcers diagnosed and its treatment. After going back and forth about the differential diagnoses. We decided to empirically treat her for a possible ulcer with protonix and Carafate. We discussed the need to crush both of these tablets and mixed with water and drink with liquid and not to take the tablets intact. We also discussed her symptoms of what also sounds like dumping. I encouraged her to follow-up with the bariatric dietitian.  Current Plans Started Carafate 1 GM Oral Tablet, 1 (one) Tablet before every  meal, 120 QS, 30 days starting 02/10/2018, Ref. x1. Local Order: crush and mix with water Started Protonix 40 MG Oral Tablet Delayed Release, 1 (one) Tablet two times daily, #60, 30 days starting 02/10/2018, No Refill. Local Order: crush and mix with water  BARIATRIC SURGERY STATUS (Z98.84) Story: s/p sleeve-gastric bypass at [facility], 02/2017; preop weight 246 lbs Impression: discussed the importance of annual f/u with her bariatric team and need to be taking MVI and Ca. discussed ramifications of not taking supplements  Leighton Ruff. Redmond Pulling, MD, FACS General, Bariatric, & Minimally Invasive Surgery [facility], [place]

## 2018-02-13 ENCOUNTER — Other Ambulatory Visit: Payer: Self-pay | Admitting: Sports Medicine

## 2018-02-13 ENCOUNTER — Ambulatory Visit: Payer: 59 | Admitting: Obstetrics & Gynecology

## 2018-02-13 ENCOUNTER — Encounter: Payer: Self-pay | Admitting: Obstetrics & Gynecology

## 2018-02-13 VITALS — BP 131/84 | HR 67 | Ht 62.0 in | Wt 152.0 lb

## 2018-02-13 DIAGNOSIS — N905 Atrophy of vulva: Secondary | ICD-10-CM

## 2018-02-13 DIAGNOSIS — N816 Rectocele: Secondary | ICD-10-CM | POA: Diagnosis not present

## 2018-02-13 DIAGNOSIS — R6882 Decreased libido: Secondary | ICD-10-CM | POA: Diagnosis not present

## 2018-02-13 DIAGNOSIS — M5416 Radiculopathy, lumbar region: Secondary | ICD-10-CM

## 2018-02-13 HISTORY — DX: Rectocele: N81.6

## 2018-02-13 MED ORDER — FLIBANSERIN 100 MG PO TABS: 1.0000 | ORAL_TABLET | Freq: Every day | ORAL | 6 refills | Status: DC

## 2018-02-13 MED ORDER — ESTRADIOL 10 MCG VA TABS: ORAL_TABLET | VAGINAL | 12 refills | Status: DC

## 2018-02-13 NOTE — Progress Notes (Signed)
   Subjective:    Patient ID: Sara Wolfe, female    DOB: Feb 10, 1961, 57 y.o.   MRN: [id number]  HPI 57 yo lady here to follow up regarding her vva and decreased libido. She used the testosterone gel and didn't feel any difference so she stopped it. She stopped using the Intrarosa because she found it to be messy. She would like to try other options for both. She did not go to pelvic PT.   Review of Systems     Objective:   Physical Exam  Breathing, conversing, and ambulating normally Well nourished, well hydrated White female, no apparent distress Abd- benign     Assessment & Plan:  Rectocele- not more symptomatic, she can live with this She was not interested in pelvic PT Decreased libido- trial of Addyi ( off label for postmenopausal) Vagifem prescribed

## 2018-02-20 ENCOUNTER — Ambulatory Visit (INDEPENDENT_AMBULATORY_CARE_PROVIDER_SITE_OTHER): Payer: 59 | Admitting: Sports Medicine

## 2018-02-20 VITALS — Temp 98.2°F | Wt 152.0 lb

## 2018-02-20 DIAGNOSIS — E538 Deficiency of other specified B group vitamins: Secondary | ICD-10-CM | POA: Diagnosis not present

## 2018-02-20 DIAGNOSIS — G43809 Other migraine, not intractable, without status migrainosus: Secondary | ICD-10-CM

## 2018-02-20 DIAGNOSIS — M5416 Radiculopathy, lumbar region: Secondary | ICD-10-CM

## 2018-02-20 MED ORDER — TRAMADOL HCL 50 MG PO TABS: 50.0000 mg | ORAL_TABLET | Freq: Three times a day (TID) | ORAL | 0 refills | Status: DC | PRN

## 2018-02-20 MED ORDER — CYANOCOBALAMIN 1000 MCG/ML IJ SOLN
1000.0000 ug | Freq: Once | INTRAMUSCULAR | Status: AC
  Administered 2018-02-20: 1000 ug via INTRAMUSCULAR

## 2018-02-20 NOTE — Assessment & Plan Note (Signed)
Unable to tolerate Topamax, we will discuss Trokendi at a follow-up visit.

## 2018-02-20 NOTE — Progress Notes (Signed)
Patient in today for a B-12 injection. Patients medications and allergies reviewed. Pt denies GI problems and denies dizziness . Pt received B-12 injection IM in left deltoid. Pt tolerated injections well without complication.  Pt advised to schedule next injection in 30 days.

## 2018-03-01 ENCOUNTER — Encounter (HOSPITAL_COMMUNITY): Payer: Self-pay

## 2018-03-01 NOTE — Patient Instructions (Addendum)
Your procedure is scheduled on: Monday, Jan. 13, 2020   Surgery Time:  9:30AM-11:00 AM   Report to [facility]  Entrance    Report to admitting at 7:30 AM   Call this number if you have problems the morning of surgery [phone]   Do not eat food:After Midnight.   MAY HAVE LIQUIDS UNTIL 6:30AM DAY OF SURGERY  CLEAR LIQUID DIET  Foods Allowed                                                                     Foods Excluded  Water, Black Coffee and tea, regular and decaf                             liquids that you cannot  Plain Jell-O in any flavor                                             see through such as: Fruit ices (not with fruit pulp)                                     milk, soups, orange juice  Iced Popsicles                                    All solid food Carbonated beverages, regular and diet                                    Cranberry, grape and apple juices Sports drinks like Gatorade Lightly seasoned clear broth or consume(fat free) Sugar, honey syrup  Sample Menu Breakfast                                Lunch                                     Supper Cranberry juice                    Beef broth                            Chicken broth Jell-O                                     Grape juice                           Apple juice Coffee or tea  Jell-O                                      Popsicle                                                Coffee or tea                        Coffee or tea   Brush your teeth the morning of surgery.   Do NOT smoke after Midnight   Complete one Ensure drink the morning of surgery at 6:30AM the day of surgery.   Take these medicines the morning of surgery with A SIP OF WATER: Pregabalin                               You may not have any metal on your body including hair pins, jewelry, and body piercings             Do not wear make-up, lotions, powders, perfumes/cologne, or  deodorant             Do not wear nail polish.  Do not shave  48 hours prior to surgery.                Do not bring valuables to the hospital. [place].   Contacts, dentures or bridgework may not be worn into surgery.    Patients discharged the day of surgery will not be allowed to drive home.   Special Instructions: Bring a copy of your healthcare power of attorney and living will documents         the day of surgery if you haven't scanned them in before.              Please read over the following fact sheets you were given:  [facility] - Preparing for Surgery Before surgery, you can play an important role.  Because skin is not sterile, your skin needs to be as free of germs as possible.  You can reduce the number of germs on your skin by washing with CHG (chlorahexidine gluconate) soap before surgery.  CHG is an antiseptic cleaner which kills germs and bonds with the skin to continue killing germs even after washing. Please DO NOT use if you have an allergy to CHG or antibacterial soaps.  If your skin becomes reddened/irritated stop using the CHG and inform your nurse when you arrive at Short Stay. Do not shave (including legs and underarms) for at least 48 hours prior to the first CHG shower.  You may shave your face/neck.  Please follow these instructions carefully:  1.  Shower with CHG Soap the night before surgery and the  morning of surgery.  2.  If you choose to wash your hair, wash your hair first as usual with your normal  shampoo.  3.  After you shampoo, rinse your hair and body thoroughly to remove the shampoo.  4.  Use CHG as you would any other liquid soap.  You can apply chg directly to the skin and wash.  Gently with a scrungie or clean washcloth.  5.  Apply the CHG Soap to your body ONLY FROM THE NECK DOWN.   Do   not use on face/ open                           Wound or open sores. Avoid contact  with eyes, ears mouth and   genitals (private parts).                       Wash face,  Genitals (private parts) with your normal soap.             6.  Wash thoroughly, paying special attention to the area where your    surgery  will be performed.  7.  Thoroughly rinse your body with warm water from the neck down.  8.  DO NOT shower/wash with your normal soap after using and rinsing off the CHG Soap.                9.  Pat yourself dry with a clean towel.            10.  Wear clean pajamas.            11.  Place clean sheets on your bed the night of your first shower and do not  sleep with pets. Day of Surgery : Do not apply any lotions/deodorants the morning of surgery.  Please wear clean clothes to the hospital/surgery center.  FAILURE TO FOLLOW THESE INSTRUCTIONS MAY RESULT IN THE CANCELLATION OF YOUR SURGERY  PATIENT SIGNATURE_________________________________  NURSE SIGNATURE__________________________________  ________________________________________________________________________

## 2018-03-03 ENCOUNTER — Encounter (HOSPITAL_COMMUNITY): Payer: Self-pay

## 2018-03-03 ENCOUNTER — Encounter (HOSPITAL_COMMUNITY)
Admission: RE | Admit: 2018-03-03 | Discharge: 2018-03-03 | Disposition: A | Discharge status: Home / Self Care | Payer: 59 | Source: Ambulatory Visit | Attending: General Surgery | Admitting: General Surgery

## 2018-03-03 ENCOUNTER — Other Ambulatory Visit: Payer: Self-pay

## 2018-03-03 DIAGNOSIS — K828 Other specified diseases of gallbladder: Secondary | ICD-10-CM | POA: Diagnosis present

## 2018-03-03 DIAGNOSIS — Z01812 Encounter for preprocedural laboratory examination: Secondary | ICD-10-CM | POA: Insufficient documentation

## 2018-03-03 DIAGNOSIS — Z9884 Bariatric surgery status: Secondary | ICD-10-CM | POA: Diagnosis not present

## 2018-03-03 DIAGNOSIS — K811 Chronic cholecystitis: Secondary | ICD-10-CM | POA: Diagnosis not present

## 2018-03-03 DIAGNOSIS — G4730 Sleep apnea, unspecified: Secondary | ICD-10-CM | POA: Diagnosis not present

## 2018-03-03 DIAGNOSIS — F17200 Nicotine dependence, unspecified, uncomplicated: Secondary | ICD-10-CM | POA: Diagnosis not present

## 2018-03-03 DIAGNOSIS — Z79899 Other long term (current) drug therapy: Secondary | ICD-10-CM | POA: Diagnosis not present

## 2018-03-03 DIAGNOSIS — Z79891 Long term (current) use of opiate analgesic: Secondary | ICD-10-CM | POA: Diagnosis not present

## 2018-03-03 HISTORY — DX: Ventral hernia without obstruction or gangrene: K43.9

## 2018-03-03 HISTORY — DX: Obstructive sleep apnea (adult) (pediatric): G47.33

## 2018-03-03 HISTORY — DX: Calculus of gallbladder without cholecystitis without obstruction: K80.20

## 2018-03-03 HISTORY — DX: Migraine, unspecified, not intractable, without status migrainosus: G43.909

## 2018-03-03 HISTORY — DX: Deficiency of other specified B group vitamins: E53.8

## 2018-03-03 HISTORY — DX: Rectocele: N81.6

## 2018-03-03 HISTORY — DX: Obstructive sleep apnea (adult) (pediatric): Z99.89

## 2018-03-03 LAB — COMPREHENSIVE METABOLIC PANEL
ALT: 19 U/L (ref 0–44)
AST: 19 U/L (ref 15–41)
Albumin: 3.7 g/dL (ref 3.5–5.0)
Alkaline Phosphatase: 69 U/L (ref 38–126)
Anion gap: 7 (ref 5–15)
BUN: 9 mg/dL (ref 6–20)
CO2: 24 mmol/L (ref 22–32)
Calcium: 8.4 mg/dL — ABNORMAL LOW (ref 8.9–10.3)
Chloride: 113 mmol/L — ABNORMAL HIGH (ref 98–111)
Creatinine, Ser: 0.7 mg/dL (ref 0.44–1.00)
GFR calc Af Amer: >60 mL/min (ref >60)
Glucose, Bld: 86 mg/dL (ref 70–99)
Potassium: 4.2 mmol/L (ref 3.5–5.1)
Sodium: 144 mmol/L (ref 135–145)
Total Bilirubin: 0.5 mg/dL (ref 0.3–1.2)
Total Protein: 5.9 g/dL — ABNORMAL LOW (ref 6.5–8.1)

## 2018-03-03 LAB — CBC WITH DIFFERENTIAL/PLATELET
Abs Immature Granulocytes: 0.02 10*3/uL (ref 0.00–0.07)
Basophils Absolute: 0.1 10*3/uL (ref 0.0–0.1)
Basophils Relative: 1 %
Eosinophils Absolute: 0.2 10*3/uL (ref 0.0–0.5)
Eosinophils Relative: 3 %
HCT: 41.5 % (ref 36.0–46.0)
Hemoglobin: 13.2 g/dL (ref 12.0–15.0)
Immature Granulocytes: 0 %
Lymphocytes Relative: 36 %
Lymphs Abs: 2.8 10*3/uL (ref 0.7–4.0)
MCH: 31.2 pg (ref 26.0–34.0)
MCHC: 31.8 g/dL (ref 30.0–36.0)
MCV: 98.1 fL (ref 80.0–100.0)
MONO ABS: 0.8 10*3/uL (ref 0.1–1.0)
Monocytes Relative: 11 %
Neutro Abs: 3.8 10*3/uL (ref 1.7–7.7)
Neutrophils Relative %: 49 %
Platelets: 260 10*3/uL (ref 150–400)
RBC: 4.23 MIL/uL (ref 3.87–5.11)
RDW: 13.2 % (ref 11.5–15.5)
WBC: 7.7 10*3/uL (ref 4.0–10.5)
nRBC: 0 % (ref 0.0–0.2)

## 2018-03-03 NOTE — Pre-Procedure Instructions (Signed)
CMP results 03/03/2018 sent to Dr. Redmond Pulling via epic.

## 2018-03-05 NOTE — Anesthesia Preprocedure Evaluation (Addendum)
Anesthesia Evaluation  Patient identified by MRN, date of birth, ID band Patient awake    Reviewed: Allergy & Precautions, NPO status , Patient's Chart, lab work & pertinent test results, Unable to perform ROS - Chart review only  History of Anesthesia Complications (+) PONV  Airway Mallampati: II  TM Distance: >3 FB Neck ROM: Full    Dental no notable dental hx. (+) Teeth Intact, Dental Advisory Given   Pulmonary sleep apnea , Current Smoker,    Pulmonary exam normal breath sounds clear to auscultation       Cardiovascular Exercise Tolerance: Good negative cardio ROS Normal cardiovascular exam Rhythm:Regular Rate:Normal     Neuro/Psych  Headaches,  Neuromuscular disease    GI/Hepatic Neg liver ROS, GERD  ,  Endo/Other  negative endocrine ROS  Renal/GU negative Renal ROS     Musculoskeletal  (+) Arthritis ,   Abdominal   Peds  Hematology   Anesthesia Other Findings   Reproductive/Obstetrics                            Anesthesia Physical Anesthesia Plan  ASA: III  Anesthesia Plan: General   Post-op Pain Management:    Induction: Intravenous  PONV Risk Score and Plan: 4 or greater and Scopolamine patch - Pre-op, Midazolam, Dexamethasone, Ondansetron and Treatment may vary due to age or medical condition  Airway Management Planned: Oral ETT  Additional Equipment:   Intra-op Plan:   Post-operative Plan: Extubation in OR  Informed Consent: I have reviewed the patients History and Physical, chart, labs and discussed the procedure including the risks, benefits and alternatives for the proposed anesthesia with the patient or authorized representative who has indicated his/her understanding and acceptance.   Dental advisory given  Plan Discussed with: CRNA  Anesthesia Plan Comments:       Anesthesia Quick Evaluation

## 2018-03-06 ENCOUNTER — Ambulatory Visit (HOSPITAL_COMMUNITY): Payer: 59 | Admitting: Physician Assistant

## 2018-03-06 ENCOUNTER — Encounter (HOSPITAL_COMMUNITY): Payer: Self-pay | Admitting: *Deleted

## 2018-03-06 ENCOUNTER — Encounter (HOSPITAL_COMMUNITY)
Admission: RE | Disposition: A | Discharge status: Home / Self Care | Payer: Self-pay | Source: Home / Self Care | Attending: General Surgery

## 2018-03-06 ENCOUNTER — Ambulatory Visit (HOSPITAL_COMMUNITY)
Admission: RE | Admit: 2018-03-06 | Discharge: 2018-03-06 | Disposition: A | Discharge status: Home / Self Care | Payer: 59 | Attending: General Surgery | Admitting: General Surgery

## 2018-03-06 ENCOUNTER — Ambulatory Visit (HOSPITAL_COMMUNITY): Payer: 59

## 2018-03-06 ENCOUNTER — Ambulatory Visit (HOSPITAL_COMMUNITY): Payer: 59 | Admitting: Anesthesiology

## 2018-03-06 DIAGNOSIS — Z79891 Long term (current) use of opiate analgesic: Secondary | ICD-10-CM | POA: Insufficient documentation

## 2018-03-06 DIAGNOSIS — Z79899 Other long term (current) drug therapy: Secondary | ICD-10-CM | POA: Insufficient documentation

## 2018-03-06 DIAGNOSIS — K811 Chronic cholecystitis: Secondary | ICD-10-CM | POA: Insufficient documentation

## 2018-03-06 DIAGNOSIS — F17200 Nicotine dependence, unspecified, uncomplicated: Secondary | ICD-10-CM | POA: Insufficient documentation

## 2018-03-06 DIAGNOSIS — G4730 Sleep apnea, unspecified: Secondary | ICD-10-CM | POA: Diagnosis not present

## 2018-03-06 DIAGNOSIS — K828 Other specified diseases of gallbladder: Secondary | ICD-10-CM | POA: Diagnosis not present

## 2018-03-06 DIAGNOSIS — E785 Hyperlipidemia, unspecified: Secondary | ICD-10-CM | POA: Diagnosis not present

## 2018-03-06 DIAGNOSIS — Z419 Encounter for procedure for purposes other than remedying health state, unspecified: Secondary | ICD-10-CM

## 2018-03-06 DIAGNOSIS — Z9884 Bariatric surgery status: Secondary | ICD-10-CM | POA: Insufficient documentation

## 2018-03-06 HISTORY — PX: CHOLECYSTECTOMY: SHX55

## 2018-03-06 SURGERY — LAPAROSCOPIC CHOLECYSTECTOMY WITH INTRAOPERATIVE CHOLANGIOGRAM
Anesthesia: General | Site: Abdomen

## 2018-03-06 MED ORDER — LACTATED RINGERS IR SOLN
Status: DC | PRN
  Administered 2018-03-06: 1000 mL

## 2018-03-06 MED ORDER — MEPERIDINE HCL 50 MG/ML IJ SOLN: 6.2500 mg | INTRAMUSCULAR | Status: DC | PRN

## 2018-03-06 MED ORDER — PROPOFOL 10 MG/ML IV BOLUS
INTRAVENOUS | Status: DC | PRN
  Administered 2018-03-06: 120 mg via INTRAVENOUS

## 2018-03-06 MED ORDER — HYDROMORPHONE HCL 1 MG/ML IJ SOLN
0.2500 mg | INTRAMUSCULAR | Status: DC | PRN
  Administered 2018-03-06 (×2): 0.5 mg via INTRAVENOUS

## 2018-03-06 MED ORDER — GLYCOPYRROLATE 0.2 MG/ML IJ SOLN
INTRAMUSCULAR | Status: DC | PRN
  Administered 2018-03-06: 0.1 mg via INTRAVENOUS

## 2018-03-06 MED ORDER — DEXAMETHASONE SODIUM PHOSPHATE 10 MG/ML IJ SOLN
INTRAMUSCULAR | Status: DC | PRN
  Administered 2018-03-06: 10 mg via INTRAVENOUS

## 2018-03-06 MED ORDER — BUPIVACAINE-EPINEPHRINE 0.25% -1:200000 IJ SOLN
INTRAMUSCULAR | Status: DC | PRN
  Administered 2018-03-06: 30 mL

## 2018-03-06 MED ORDER — IOPAMIDOL (ISOVUE-300) INJECTION 61%
INTRAVENOUS | Status: AC
  Filled 2018-03-06: qty 50

## 2018-03-06 MED ORDER — OXYCODONE HCL 5 MG PO TABS: 5.0000 mg | ORAL_TABLET | Freq: Four times a day (QID) | ORAL | 0 refills | Status: DC | PRN

## 2018-03-06 MED ORDER — SCOPOLAMINE 1 MG/3DAYS TD PT72
1.0000 | MEDICATED_PATCH | TRANSDERMAL | Status: DC
  Administered 2018-03-06: 1.5 mg via TRANSDERMAL
  Filled 2018-03-06: qty 1

## 2018-03-06 MED ORDER — IOPAMIDOL (ISOVUE-300) INJECTION 61%
INTRAVENOUS | Status: DC | PRN
  Administered 2018-03-06: 50 mL via INTRAVENOUS

## 2018-03-06 MED ORDER — METOCLOPRAMIDE HCL 5 MG/ML IJ SOLN
INTRAMUSCULAR | Status: DC | PRN
  Administered 2018-03-06: 5 mg via INTRAVENOUS

## 2018-03-06 MED ORDER — ACETAMINOPHEN 500 MG PO TABS
1000.0000 mg | ORAL_TABLET | ORAL | Status: AC
  Administered 2018-03-06: 1000 mg via ORAL
  Filled 2018-03-06: qty 2

## 2018-03-06 MED ORDER — CIPROFLOXACIN IN D5W 400 MG/200ML IV SOLN
400.0000 mg | INTRAVENOUS | Status: AC
  Administered 2018-03-06: 400 mg via INTRAVENOUS
  Filled 2018-03-06: qty 200

## 2018-03-06 MED ORDER — EPHEDRINE SULFATE 50 MG/ML IJ SOLN
INTRAMUSCULAR | Status: DC | PRN
  Administered 2018-03-06 (×2): 5 mg via INTRAVENOUS
  Administered 2018-03-06: 10 mg via INTRAVENOUS
  Administered 2018-03-06: 5 mg via INTRAVENOUS

## 2018-03-06 MED ORDER — MIDAZOLAM HCL 2 MG/2ML IJ SOLN
INTRAMUSCULAR | Status: AC
  Filled 2018-03-06: qty 2

## 2018-03-06 MED ORDER — GABAPENTIN 300 MG PO CAPS
300.0000 mg | ORAL_CAPSULE | ORAL | Status: AC
  Administered 2018-03-06: 300 mg via ORAL
  Filled 2018-03-06: qty 1

## 2018-03-06 MED ORDER — LACTATED RINGERS IV SOLN
INTRAVENOUS | Status: DC
  Administered 2018-03-06: 08:00:00 via INTRAVENOUS

## 2018-03-06 MED ORDER — HYDROMORPHONE HCL 1 MG/ML IJ SOLN
INTRAMUSCULAR | Status: AC
  Filled 2018-03-06: qty 1

## 2018-03-06 MED ORDER — CHLORHEXIDINE GLUCONATE CLOTH 2 % EX PADS: 6.0000 | MEDICATED_PAD | Freq: Once | CUTANEOUS | Status: DC

## 2018-03-06 MED ORDER — MIDAZOLAM HCL 5 MG/5ML IJ SOLN
INTRAMUSCULAR | Status: DC | PRN
  Administered 2018-03-06 (×2): 1 mg via INTRAVENOUS

## 2018-03-06 MED ORDER — PHENYLEPHRINE HCL 10 MG/ML IJ SOLN
INTRAMUSCULAR | Status: DC | PRN
  Administered 2018-03-06 (×2): 40 ug via INTRAVENOUS

## 2018-03-06 MED ORDER — SUGAMMADEX SODIUM 500 MG/5ML IV SOLN
INTRAVENOUS | Status: DC | PRN
  Administered 2018-03-06: 200 mg via INTRAVENOUS

## 2018-03-06 MED ORDER — ROCURONIUM BROMIDE 100 MG/10ML IV SOLN
INTRAVENOUS | Status: DC | PRN
  Administered 2018-03-06: 50 mg via INTRAVENOUS

## 2018-03-06 MED ORDER — ACETAMINOPHEN 10 MG/ML IV SOLN: 1000.0000 mg | Freq: Once | INTRAVENOUS | Status: DC | PRN

## 2018-03-06 MED ORDER — BUPIVACAINE-EPINEPHRINE (PF) 0.25% -1:200000 IJ SOLN
INTRAMUSCULAR | Status: AC
  Filled 2018-03-06: qty 30

## 2018-03-06 MED ORDER — ONDANSETRON HCL 4 MG/2ML IJ SOLN
INTRAMUSCULAR | Status: DC | PRN
  Administered 2018-03-06: 4 mg via INTRAVENOUS

## 2018-03-06 MED ORDER — CHLORHEXIDINE GLUCONATE CLOTH 2 % EX PADS
6.0000 | MEDICATED_PAD | Freq: Once | CUTANEOUS | Status: AC
  Administered 2018-03-06: 6 via TOPICAL

## 2018-03-06 MED ORDER — FENTANYL CITRATE (PF) 100 MCG/2ML IJ SOLN
INTRAMUSCULAR | Status: AC
  Filled 2018-03-06: qty 2

## 2018-03-06 MED ORDER — ONDANSETRON HCL 4 MG/2ML IJ SOLN: 4.0000 mg | Freq: Once | INTRAMUSCULAR | Status: DC | PRN

## 2018-03-06 MED ORDER — PROPOFOL 10 MG/ML IV BOLUS
INTRAVENOUS | Status: AC
  Filled 2018-03-06: qty 20

## 2018-03-06 MED ORDER — HYDROCODONE-ACETAMINOPHEN 7.5-325 MG PO TABS
ORAL_TABLET | ORAL | Status: AC
  Filled 2018-03-06: qty 1

## 2018-03-06 MED ORDER — FENTANYL CITRATE (PF) 100 MCG/2ML IJ SOLN
INTRAMUSCULAR | Status: DC | PRN
  Administered 2018-03-06: 100 ug via INTRAVENOUS

## 2018-03-06 MED ORDER — 0.9 % SODIUM CHLORIDE (POUR BTL) OPTIME
TOPICAL | Status: DC | PRN
  Administered 2018-03-06: 1000 mL

## 2018-03-06 MED ORDER — HYDROCODONE-ACETAMINOPHEN 7.5-325 MG PO TABS
1.0000 | ORAL_TABLET | Freq: Once | ORAL | Status: AC | PRN
  Administered 2018-03-06: 1 via ORAL

## 2018-03-06 MED ORDER — LACTATED RINGERS IV SOLN
INTRAVENOUS | Status: DC | PRN
  Administered 2018-03-06 (×2): via INTRAVENOUS

## 2018-03-06 MED ORDER — LIDOCAINE HCL (CARDIAC) PF 100 MG/5ML IV SOSY
PREFILLED_SYRINGE | INTRAVENOUS | Status: DC | PRN
  Administered 2018-03-06: 100 mg via INTRAVENOUS

## 2018-03-06 SURGICAL SUPPLY — 55 items
ADH SKN CLS APL DERMABOND .7 (GAUZE/BANDAGES/DRESSINGS)
APL SKNCLS STERI-STRIP NONHPOA (GAUZE/BANDAGES/DRESSINGS) ×1
APL SRG 38 LTWT LNG FL B (MISCELLANEOUS)
APPLICATOR ARISTA FLEXITIP XL (MISCELLANEOUS) IMPLANT
APPLIER CLIP 5 13 M/L LIGAMAX5 (MISCELLANEOUS)
APPLIER CLIP ROT 10 11.4 M/L (STAPLE)
APR CLP MED LRG 11.4X10 (STAPLE)
APR CLP MED LRG 5 ANG JAW (MISCELLANEOUS)
BAG SPEC RTRVL 10 TROC 200 (ENDOMECHANICALS) ×1
BANDAGE ADH SHEER 1  50/CT (GAUZE/BANDAGES/DRESSINGS) ×4 IMPLANT
BENZOIN TINCTURE PRP APPL 2/3 (GAUZE/BANDAGES/DRESSINGS) ×1 IMPLANT
CABLE HIGH FREQUENCY MONO STRZ (ELECTRODE) ×2 IMPLANT
CHLORAPREP W/TINT 26ML (MISCELLANEOUS) ×2 IMPLANT
CLIP APPLIE 5 13 M/L LIGAMAX5 (MISCELLANEOUS) IMPLANT
CLIP APPLIE ROT 10 11.4 M/L (STAPLE) IMPLANT
CLIP VESOLOCK MED LG 6/CT (CLIP) IMPLANT
COVER MAYO STAND STRL (DRAPES) ×1 IMPLANT
COVER SURGICAL LIGHT HANDLE (MISCELLANEOUS) ×2 IMPLANT
COVER WAND RF STERILE (DRAPES) IMPLANT
DECANTER SPIKE VIAL GLASS SM (MISCELLANEOUS) ×1 IMPLANT
DERMABOND ADVANCED (GAUZE/BANDAGES/DRESSINGS)
DERMABOND ADVANCED .7 DNX12 (GAUZE/BANDAGES/DRESSINGS) IMPLANT
DRAPE C-ARM 42X120 X-RAY (DRAPES) ×1 IMPLANT
DRSG TEGADERM 2-3/8X2-3/4 SM (GAUZE/BANDAGES/DRESSINGS) IMPLANT
ELECT PENCIL ROCKER SW 15FT (MISCELLANEOUS) IMPLANT
ELECT REM PT RETURN 15FT ADLT (MISCELLANEOUS) ×2 IMPLANT
GAUZE SPONGE 2X2 8PLY STRL LF (GAUZE/BANDAGES/DRESSINGS) IMPLANT
GLOVE BIO SURGEON STRL SZ7.5 (GLOVE) ×2 IMPLANT
GLOVE INDICATOR 8.0 STRL GRN (GLOVE) ×2 IMPLANT
GOWN STRL REUS W/TWL XL LVL3 (GOWN DISPOSABLE) ×6 IMPLANT
GRASPER SUT TROCAR 14GX15 (MISCELLANEOUS) ×1 IMPLANT
HEMOSTAT ARISTA ABSORB 3G PWDR (MISCELLANEOUS) IMPLANT
HEMOSTAT SNOW SURGICEL 2X4 (HEMOSTASIS) IMPLANT
KIT BASIN OR (CUSTOM PROCEDURE TRAY) ×2 IMPLANT
L-HOOK LAP DISP 36CM (ELECTROSURGICAL)
LHOOK LAP DISP 36CM (ELECTROSURGICAL) IMPLANT
POUCH RETRIEVAL ECOSAC 10 (ENDOMECHANICALS) ×1 IMPLANT
POUCH RETRIEVAL ECOSAC 10MM (ENDOMECHANICALS) ×1
SCISSORS LAP 5X35 DISP (ENDOMECHANICALS) ×2 IMPLANT
SET CHOLANGIOGRAPH MIX (MISCELLANEOUS) ×1 IMPLANT
SET IRRIG TUBING LAPAROSCOPIC (IRRIGATION / IRRIGATOR) ×2 IMPLANT
SLEEVE XCEL OPT CAN 5 100 (ENDOMECHANICALS) ×4 IMPLANT
SPONGE GAUZE 2X2 STER 10/PKG (GAUZE/BANDAGES/DRESSINGS)
STRIP CLOSURE SKIN 1/2X4 (GAUZE/BANDAGES/DRESSINGS) ×1 IMPLANT
SUT MNCRL AB 4-0 PS2 18 (SUTURE) ×2 IMPLANT
SUT VIC AB 0 UR5 27 (SUTURE) ×1 IMPLANT
SUT VICRYL 0 TIES 12 18 (SUTURE) IMPLANT
SUT VICRYL 0 UR6 27IN ABS (SUTURE) IMPLANT
TOWEL OR 17X26 10 PK STRL BLUE (TOWEL DISPOSABLE) ×2 IMPLANT
TOWEL OR NON WOVEN STRL DISP B (DISPOSABLE) ×2 IMPLANT
TRAY LAPAROSCOPIC (CUSTOM PROCEDURE TRAY) ×2 IMPLANT
TROCAR BLADELESS OPT 5 100 (ENDOMECHANICALS) ×2 IMPLANT
TROCAR XCEL BLUNT TIP 100MML (ENDOMECHANICALS) IMPLANT
TROCAR XCEL NON-BLD 11X100MML (ENDOMECHANICALS) ×1 IMPLANT
TUBING INSUF HEATED (TUBING) ×2 IMPLANT

## 2018-03-06 NOTE — Anesthesia Procedure Notes (Signed)
Procedure Name: Intubation Date/Time: 03/06/2018 9:34 AM Performed by: Barnet Glasgow, MD Pre-anesthesia Checklist: Patient identified, Emergency Drugs available, Suction available, Timeout performed and Patient being monitored Patient Re-evaluated:Patient Re-evaluated prior to induction Oxygen Delivery Method: Circle system utilized Preoxygenation: Pre-oxygenation with 100% oxygen Induction Type: IV induction Ventilation: Mask ventilation without difficulty Laryngoscope Size: Mac and 3 Grade View: Grade II Tube type: Oral Tube size: 7.0 mm Number of attempts: 1 Airway Equipment and Method: Stylet Placement Confirmation: ETT inserted through vocal cords under direct vision,  positive ETCO2 and breath sounds checked- equal and bilateral Secured at: 21 cm Tube secured with: Tape Dental Injury: Teeth and Oropharynx as per pre-operative assessment

## 2018-03-06 NOTE — Anesthesia Postprocedure Evaluation (Signed)
Anesthesia Post Note  Patient: Sara Wolfe  Procedure(s) Performed: LAPAROSCOPIC CHOLECYSTECTOMY (N/A Abdomen)     Patient location during evaluation: PACU Anesthesia Type: General Level of consciousness: awake and alert Pain management: pain level controlled Vital Signs Assessment: post-procedure vital signs reviewed and stable Respiratory status: spontaneous breathing, nonlabored ventilation, respiratory function stable and patient connected to nasal cannula oxygen Cardiovascular status: blood pressure returned to baseline and stable Postop Assessment: no apparent nausea or vomiting Anesthetic complications: no    Last Vitals:  Vitals:   03/06/18 1130 03/06/18 1145  BP: (!) 102/52 102/64  Pulse: (!) 56 (!) 57  Resp: 13 12  Temp:  37.2 C  SpO2: 95% 98%    Last Pain:  Vitals:   03/06/18 1145  TempSrc:   PainSc: 4                  Barnet Glasgow

## 2018-03-06 NOTE — Interval H&P Note (Signed)
History and Physical Interval Note:  03/06/2018 9:10 AM  Sara Wolfe  has presented today for surgery, with the diagnosis of Biliary dyskinesia, H/O gastric bypass  The various methods of treatment have been discussed with the patient and family. After consideration of risks, benefits and other options for treatment, the patient has consented to  Procedure(s): LAPAROSCOPIC CHOLECYSTECTOMY WITH INTRAOPERATIVE CHOLANGIOGRAM (N/A) as a surgical intervention .  The patient's history has been reviewed, patient examined, no change in status, stable for surgery.  I have reviewed the patient's chart and labs.  Questions were answered to the patient's satisfaction.     Greer Pickerel

## 2018-03-06 NOTE — Discharge Instructions (Signed)
[facility] °LAPAROSCOPIC SURGERY: POST OP INSTRUCTIONS °Always review your discharge instruction sheet given to you by the facility where your surgery was performed. °IF YOU HAVE DISABILITY OR FAMILY LEAVE FORMS, YOU MUST BRING THEM TO THE OFFICE FOR PROCESSING.   °DO NOT GIVE THEM TO YOUR DOCTOR. ° °PAIN CONTROL ° °1. First take acetaminophen (Tylenol) AND/or ibuprofen (Advil) to control your pain after surgery.  Follow directions on package.  Taking acetaminophen (Tylenol) and/or ibuprofen (Advil) regularly after surgery will help to control your pain and lower the amount of prescription pain medication you may need.  You should not take more than 3,000 mg (3 grams) of acetaminophen (Tylenol) in 24 hours.  You should not take ibuprofen (Advil), aleve, motrin, naprosyn or other NSAIDS if you have a history of stomach ulcers or chronic kidney disease.  °2. A prescription for pain medication may be given to you upon discharge.  Take your pain medication as prescribed, if you still have uncontrolled pain after taking acetaminophen (Tylenol) or ibuprofen (Advil). °3. Use ice packs to help control pain. °4. If you need a refill on your pain medication, please contact your pharmacy.  They will contact our office to request authorization. Prescriptions will not be filled after 5pm or on week-ends. ° °HOME MEDICATIONS °5. Take your usually prescribed medications unless otherwise directed. ° °DIET °6. You should follow a light diet the first few days after arrival home.  Be sure to include lots of fluids daily. Avoid fatty, fried foods.  ° °CONSTIPATION °7. It is common to experience some constipation after surgery and if you are taking pain medication.  Increasing fluid intake and taking a stool softener (such as Colace) will usually help or prevent this problem from occurring.  A mild laxative (Milk of Magnesia or Miralax) should be taken according to package instructions if there are no bowel  movements after 48 hours. ° °WOUND/INCISION CARE °8. Most patients will experience some swelling and bruising in the area of the incisions.  Ice packs will help.  Swelling and bruising can take several days to resolve.  °9. Unless discharge instructions indicate otherwise, follow guidelines below  °a. STERI-STRIPS - you may remove your outer bandages 48 hours after surgery, and you may shower at that time.  You have steri-strips (small skin tapes) in place directly over the incision.  These strips should be left on the skin for 7-10 days.   °b. DERMABOND/SKIN GLUE - you may shower in 24 hours.  The glue will flake off over the next 2-3 weeks. °10. Any sutures or staples will be removed at the office during your follow-up visit. ° °ACTIVITIES °11. You may resume regular (light) daily activities beginning the next day--such as daily self-care, walking, climbing stairs--gradually increasing activities as tolerated.  You may have sexual intercourse when it is comfortable.  Refrain from any heavy lifting or straining until approved by your doctor. °a. You may drive when you are no longer taking prescription pain medication, you can comfortably wear a seatbelt, and you can safely maneuver your car and apply brakes. ° °FOLLOW-UP °12. You should see your doctor in the office for a follow-up appointment approximately 2-3 weeks after your surgery.  You should have been given your post-op/follow-up appointment when your surgery was scheduled.  If you did not receive a post-op/follow-up appointment, make sure that you call for this appointment within a day or two after you arrive home to insure a convenient appointment time. ° °OTHER   INSTRUCTIONS °13.  ° °WHEN TO CALL YOUR DOCTOR: °1. Fever over 101.0 °2. Inability to urinate °3. Continued bleeding from incision. °4. Increased pain, redness, or drainage from the incision. °5. Increasing abdominal pain ° °The clinic staff is available to answer your questions during regular  business hours.  Please don’t hesitate to call and ask to speak to one of the nurses for clinical concerns.  If you have a medical emergency, go to the nearest emergency room or call 911.  A surgeon from [facility] is always on call at the hospital. °[address], [address], [place], [place]  [address] ? [address] [address], [place], [place]   [address] °[phone] ? [phone] ? FAX [phone] °Web site: [email or web address] ° °••••••••• ° ° °Managing Your Pain After Surgery Without Opioids ° ° ° °Thank you for participating in our program to help patients manage their pain after surgery without opioids. This is part of our effort to provide you with the best care possible, without exposing you or your family to the risk that opioids pose. ° °What pain can I expect after surgery? °You can expect to have some pain after surgery. This is normal. The pain is typically worse the day after surgery, and quickly begins to get better. °Many studies have found that many patients are able to manage their pain after surgery with Over-the-Counter (OTC) medications such as Tylenol and Motrin. If you have a condition that does not allow you to take Tylenol or Motrin, notify your surgical team. ° °How will I manage my pain? °The best strategy for controlling your pain after surgery is around the clock pain control with Tylenol (acetaminophen) and Motrin (ibuprofen or Advil). Alternating these medications with each other allows you to maximize your pain control. In addition to Tylenol and Motrin, you can use heating pads or ice packs on your incisions to help reduce your pain. ° °How will I alternate your regular strength over-the-counter pain medication? °You will take a dose of pain medication every three hours. °; Start by taking 650 mg of Tylenol (2 pills of 325 mg) °; 3 hours later take 600 mg of Motrin (3 pills of 200 mg) °; 3 hours after taking the Motrin take 650 mg of Tylenol °; 3 hours  after that take 600 mg of Motrin. ° ° °- 1 - ° °See example - if your first dose of Tylenol is at 12:00 PM ° ° °12:00 PM Tylenol 650 mg (2 pills of 325 mg)  °3:00 PM Motrin 600 mg (3 pills of 200 mg)  °6:00 PM Tylenol 650 mg (2 pills of 325 mg)  °9:00 PM Motrin 600 mg (3 pills of 200 mg)  °Continue alternating every 3 hours  ° °We recommend that you follow this schedule around-the-clock for at least 3 days after surgery, or until you feel that it is no longer needed. Use the table on the last page of this handout to keep track of the medications you are taking. °Important: °Do not take more than 3000mg of Tylenol or 3200mg of Motrin in a 24-hour period. °Do not take ibuprofen/Motrin if you have a history of bleeding stomach ulcers, severe kidney disease, &/or actively taking a blood thinner ° °What if I still have pain? °If you have pain that is not controlled with the over-the-counter pain medications (Tylenol and Motrin or Advil) you might have what we call “breakthrough” pain. You will receive a prescription for a small amount of an opioid   pain medication such as Oxycodone, Tramadol, or Tylenol with Codeine. Use these opioid pills in the first 24 hours after surgery if you have breakthrough pain. Do not take more than 1 pill every 4-6 hours. ° °If you still have uncontrolled pain after using all opioid pills, don't hesitate to call our staff using the number provided. We will help make sure you are managing your pain in the best way possible, and if necessary, we can provide a prescription for additional pain medication. ° ° °Day 1   ° °Time  °Name of Medication Number of pills taken  °Amount of Acetaminophen  °Pain Level  ° °Comments  °AM PM       °AM PM       °AM PM       °AM PM       °AM PM       °AM PM       °AM PM       °AM PM       °Total Daily amount of Acetaminophen °Do not take more than  3,000 mg per day    ° ° °Day 2   ° °Time  °Name of Medication Number of pills °taken  °Amount of Acetaminophen  °Pain  Level  ° °Comments  °AM PM       °AM PM       °AM PM       °AM PM       °AM PM       °AM PM       °AM PM       °AM PM       °Total Daily amount of Acetaminophen °Do not take more than  3,000 mg per day    ° ° °Day 3   ° °Time  °Name of Medication Number of pills taken  °Amount of Acetaminophen  °Pain Level  ° °Comments  °AM PM       °AM PM       °AM PM       °AM PM       ° ° ° °AM PM       °AM PM       °AM PM       °AM PM       °Total Daily amount of Acetaminophen °Do not take more than  3,000 mg per day    ° ° °Day 4   ° °Time  °Name of Medication Number of pills taken  °Amount of Acetaminophen  °Pain Level  ° °Comments  °AM PM       °AM PM       °AM PM       °AM PM       °AM PM       °AM PM       °AM PM       °AM PM       °Total Daily amount of Acetaminophen °Do not take more than  3,000 mg per day    ° ° °Day 5   ° °Time  °Name of Medication Number °of pills taken  °Amount of Acetaminophen  °Pain Level  ° °Comments  °AM PM       °AM PM       °AM PM       °AM PM       °AM PM       °AM PM       °  AM PM       °AM PM       °Total Daily amount of Acetaminophen °Do not take more than  3,000 mg per day    ° ° ° °Day 6   ° °Time  °Name of Medication Number of pills °taken  °Amount of Acetaminophen  °Pain Level  °Comments  °AM PM       °AM PM       °AM PM       °AM PM       °AM PM       °AM PM       °AM PM       °AM PM       °Total Daily amount of Acetaminophen °Do not take more than  3,000 mg per day    ° ° °Day 7   ° °Time  °Name of Medication Number of pills taken  °Amount of Acetaminophen  °Pain Level  ° °Comments  °AM PM       °AM PM       °AM PM       °AM PM       °AM PM       °AM PM       °AM PM       °AM PM       °Total Daily amount of Acetaminophen °Do not take more than  3,000 mg per day    ° ° ° ° °For additional information about how and where to safely dispose of unused opioid °medications - https://www.morepowerfulnc.org ° °Disclaimer: This document contains information and/or instructional materials adapted  from [facility] for the typical patient with your condition. It does not replace medical advice from your health care provider because your experience may differ from that of the °typical patient. Talk to your health care provider if you have any questions about this °document, your condition or your treatment plan. °Adapted from [facility] ° ° °

## 2018-03-06 NOTE — H&P (Signed)
Sara Wolfe Documented: 02/10/2018 1:31 PM Location: [facility] Patient #: [id number] DOB: 05-02-1960 Married / Language: Sara Wolfe / Race: White Female   History of Present Illness Sara Hiss M. Rosalio Catterton MD; 02/11/2018 2:28 PM) The patient is a 58 year old female who presents with abdominal pain. She is referred by Dr [name] to discuss gallbladder disease. She underwent "sleeve gastric bypass" at [facility] in 02/2016. She states for over several months now she has been having colicky right upper quadrant and epigastric pain generally after eating meals. She describes it as getting sick with eating. It doesn't occur with every food. She will generally have nausea with it. It'll generally occur within 30 minutes of eating and last for an hour. She denies any fever or chills or vomiting. This is a different type of discomfort then dumping which she has had in the past. Occasionally she will also have episodes of abdominal pain along with sweating, heart racing, and diarrhea within 1 hour of eating. She states that she is taking her multivitamin is irregularly and is not taking calcium. She is getting monthly B12 injection. She has noticed a pattern of the epigastric and right upper quadrant pain with salads, sour cream and BLTs. She denies any tobacco use. She may take ibuprofen once in a while but not regularly. She last saw her bariatric surgeon about 11 months ago. An ultrasound which demonstrated no evidence of gallstones or wall thickening. Common bile duct was normal. She underwent a nuclear medicine scan which showed visualization of the gallbladder after 1 hour. Her gallbladder ejection fraction was 40%   Problem List/Past Medical Sara Hiss M. Redmond Pulling, MD; 02/11/2018 2:32 PM) BARIATRIC SURGERY STATUS [phone])  s/p sleeve-gastric bypass at [facility], 02/2017; preop weight 246 lbs BILIARY DYSKINESIA (K82.8)  EPIGASTRIC PAIN (R10.13)   Past Surgical History Sara Hiss M. Redmond Pulling,  MD; 02/11/2018 2:20 PM) Foot Surgery  Bilateral. Gastric Bypass  Spinal Surgery - Neck   Diagnostic Studies History Sara Hiss M. Redmond Pulling, MD; 02/11/2018 2:20 PM) Mammogram  within last year Pap Smear  1-5 years ago  Allergies ([name], [place]; 02/10/2018 1:33 PM) No Known Drug Allergies [02/10/2018]: Allergies Reconciled   Medication History Sara Hiss M. Redmond Pulling, MD; 02/11/2018 2:32 PM) traMADol HCl (50MG  Tablet, Oral) Active. Lyrica (200MG  Capsule, Oral) Active. Methocarbamol (500MG  Tablet, Oral) Active. Medications Reconciled Carafate (1GM Tablet, 1 (one) Oral before every meal, Taken starting 02/10/2018) Active. (crush and mix with water) Protonix (40MG  Tablet DR, 1 (one) Oral two times daily, Taken starting 02/10/2018) Active. (crush and mix with water)  Social History Sara Hiss M. Redmond Pulling, MD; 02/11/2018 2:20 PM) Caffeine use  Tea. Illicit drug use  Remotely quit drug use. No alcohol use  Tobacco use  Current some day smoker.  Family History Sara Hiss M. Redmond Pulling, MD; 02/11/2018 2:20 PM) Arthritis  Father, Mother. Colon Cancer  Sister. Colon Polyps  Mother. Hypertension  Father. Migraine Headache  Daughter.  Pregnancy / Birth History Sara Hiss M. Redmond Pulling, MD; 02/11/2018 2:20 PM) Age at menarche  61 years. Age of menopause  53-50 Gravida  2 Length (months) of breastfeeding  3-6 Maternal age  77-20 Para  2  Other Problems Sara Hiss M. Redmond Pulling, MD; 02/11/2018 2:32 PM) Arthritis  Back Pain  Gastroesophageal Reflux Disease  Hemorrhoids  Migraine Headache  Sleep Apnea     Review of Systems Sara Hiss M. Rachael Zapanta MD; 02/11/2018 2:20 PM) General Not Present- Appetite Loss, Chills, Fatigue, Fever, Night Sweats, Weight Gain and Weight Loss. Skin Not Present- Change in Wart/Mole, Dryness, Hives,  Jaundice, New Lesions, Non-Healing Wounds, Rash and Ulcer. HEENT Present- Ringing in the Ears and Wears glasses/contact lenses. Not Present- Earache, Hearing Loss,  Hoarseness, Nose Bleed, Oral Ulcers, Seasonal Allergies, Sinus Pain, Sore Throat, Visual Disturbances and Yellow Eyes. Respiratory Present- Snoring. Not Present- Bloody sputum, Chronic Cough, Difficulty Breathing and Wheezing. Gastrointestinal Present- Abdominal Pain, Bloating, Change in Bowel Habits, Excessive gas, Gets full quickly at meals, Hemorrhoids, Indigestion and Nausea. Not Present- Bloody Stool, Chronic diarrhea, Constipation, Difficulty Swallowing, Rectal Pain and Vomiting. Female Genitourinary Not Present- Frequency, Nocturia, Painful Urination, Pelvic Pain and Urgency. Musculoskeletal Present- Back Pain. Not Present- Joint Pain, Joint Stiffness, Muscle Pain, Muscle Weakness and Swelling of Extremities. Neurological Present- Headaches and Numbness. Not Present- Decreased Memory, Fainting, Seizures, Tingling, Tremor, Trouble walking and Weakness. Psychiatric Not Present- Anxiety, Bipolar, Change in Sleep Pattern, Depression, Fearful and Frequent crying. Endocrine Not Present- Cold Intolerance, Excessive Hunger, Hair Changes, Heat Intolerance, Hot flashes and New Diabetes. Hematology Present- Easy Bruising. Not Present- Blood Thinners, Excessive bleeding, Gland problems, HIV and Persistent Infections.  Vitals (Tanisha A. Brown RMA; 02/10/2018 1:33 PM) 02/10/2018 1:32 PM Weight: 152.2 lb Height: 62in Body Surface Area: 1.7 m Body Mass Index: 27.84 kg/m  Temp.: 98.19F  Pulse: 73 (Regular)  BP: 122/64 (Sitting, Left Arm, Standard)       Physical Exam Sara Hiss M. Nuno Brubacher MD; 02/11/2018 2:29 PM) General Mental Status-Alert. General Appearance-Consistent with stated age. Hydration-Well hydrated. Voice-Normal. Note: overweight   Head and Neck Head-normocephalic, atraumatic with no lesions or palpable masses. Trachea-midline. Thyroid Gland Characteristics - normal size and consistency.  Eye Eyeball - Bilateral-Extraocular movements  intact. Sclera/Conjunctiva - Bilateral-No scleral icterus.  ENMT Ears -Note: nml ext ears.  Mouth and Throat -Note: lips intact.   Chest and Lung Exam Chest and lung exam reveals -quiet, even and easy respiratory effort with no use of accessory muscles and on auscultation, normal breath sounds, no adventitious sounds and normal vocal resonance. Inspection Chest Wall - Normal. Back - normal.  Breast - Did not examine.  Cardiovascular Cardiovascular examination reveals -normal heart sounds, regular rate and rhythm with no murmurs and normal pedal pulses bilaterally.  Abdomen Inspection Inspection of the abdomen reveals - No Hernias. Skin - Scar - no surgical scars. Palpation/Percussion Palpation and Percussion of the abdomen reveal - Soft, Non Tender, No Rebound tenderness, No Rigidity (guarding) and No hepatosplenomegaly. Auscultation Auscultation of the abdomen reveals - Bowel sounds normal. Note: chaperone present - Tanisha   Peripheral Vascular Upper Extremity Palpation - Pulses bilaterally normal.  Neurologic Neurologic evaluation reveals -alert and oriented x 3 with no impairment of recent or remote memory. Mental Status-Normal.  Neuropsychiatric The patient's mood and affect are described as -normal. Judgment and Insight-insight is appropriate concerning matters relevant to self.  Musculoskeletal Normal Exam - Left-Upper Extremity Strength Normal and Lower Extremity Strength Normal. Normal Exam - Right-Upper Extremity Strength Normal and Lower Extremity Strength Normal.  Lymphatic Head & Neck  General Head & Neck Lymphatics: Bilateral - Description - Normal. Axillary - Did not examine. Femoral & Inguinal - Did not examine.    Assessment & Plan Sara Hiss M. Palmina Clodfelter MD; 02/11/2018 2:32 PM) BILIARY DYSKINESIA (K82.8) Impression: I believe some of the patient's symptoms are consistent with gallbladder disease.  We discussed gallbladder  disease. The patient was given Neurosurgeon. We discussed non-operative and operative management. We discussed the signs & symptoms of acute cholecystitis  I discussed laparoscopic cholecystectomy with IOC in detail. The patient was given educational material as well  as diagrams detailing the procedure. We discussed the risks and benefits of a laparoscopic cholecystectomy including, but not limited to bleeding, infection, injury to surrounding structures such as the intestine or liver, bile leak, retained gallstones, need to convert to an open procedure, prolonged diarrhea, blood clots such as DVT, common bile duct injury, anesthesia risks, and possible need for additional procedures. We discussed the typical post-operative recovery course. I explained that the likelihood of improvement of their symptoms is fair  The patient has elected to proceed with surgery. Current Plans Pt Education - Pamphlet Given - Laparoscopic Gallbladder Surgery: discussed with patient and provided information. EPIGASTRIC PAIN (R10.13) Impression: We discussed that her abdominal issues may be multifactorial. I do believe a component of her symptoms is probably attributable to biliary dyskinesia. But we also discussed the possibility of a marginal ulcer especially she has some episodes of upper abdominal discomfort that occur immediately to 10 minutes after eating. We discussed that cholecystectomy may not ameliorate all of her discomfort. We discussed how marginal ulcers diagnosed and its treatment. After going back and forth about the differential diagnoses. We decided to empirically treat her for a possible ulcer with protonix and Carafate. We discussed the need to crush both of these tablets and mixed with water and drink with liquid and not to take the tablets intact. We also discussed her symptoms of what also sounds like dumping. I encouraged her to follow-up with the bariatric dietitian. Current Plans Started  Carafate 1 GM Oral Tablet, 1 (one) Tablet before every meal, 120 QS, 30 days starting 02/10/2018, Ref. x1. Local Order: crush and mix with water Started Protonix 40 MG Oral Tablet Delayed Release, 1 (one) Tablet two times daily, #60, 30 days starting 02/10/2018, No Refill. Local Order: crush and mix with water BARIATRIC SURGERY STATUS (Z98.84) Story: s/p sleeve-gastric bypass at [facility], 02/2017; preop weight 246 lbs Impression: discussed the importance of annual f/u with her bariatric team and need to be taking MVI and Ca. discussed ramifications of not taking supplements  Leighton Ruff. Redmond Pulling, MD, FACS General, Bariatric, & Minimally Invasive Surgery [facility], [place]

## 2018-03-06 NOTE — Transfer of Care (Signed)
Immediate Anesthesia Transfer of Care Note  Patient: Sara Wolfe  Procedure(s) Performed: LAPAROSCOPIC CHOLECYSTECTOMY (N/A Abdomen)  Patient Location: PACU  Anesthesia Type:General  Level of Consciousness: awake, oriented, drowsy and patient cooperative  Airway & Oxygen Therapy: Patient Spontanous Breathing and Patient connected to face mask oxygen  Post-op Assessment: Report given to RN, Post -op Vital signs reviewed and stable and Patient moving all extremities X 4  Post vital signs: Reviewed and stable  Last Vitals:  Vitals Value Taken Time  BP 116/51 03/06/2018 10:55 AM  Temp    Pulse 65 03/06/2018 10:56 AM  Resp 15 03/06/2018 10:56 AM  SpO2 100 % 03/06/2018 10:56 AM  Vitals shown include unvalidated device data.  Last Pain:  Vitals:   03/06/18 0735  TempSrc: Oral         Complications: No apparent anesthesia complications

## 2018-03-06 NOTE — Op Note (Signed)
Sara Wolfe [id number] [birth year] 03/06/2018  Laparoscopic Cholecystectomy with Procedure Note  Indications: This patient presents with symptomatic gallbladder disease and will undergo laparoscopic cholecystectomy.  Pre-operative Diagnosis: biliary dyskinesia; h/o "sleeve bypass" at outside hospital  Post-operative Diagnosis: Same  Surgeon: Greer Pickerel MD FACS  Assistants: none  Anesthesia: General endotracheal anesthesia  Procedure Details  The patient was seen again in the Holding Room. The risks, benefits, complications, treatment options, and expected outcomes were discussed with the patient. The possibilities of reaction to medication, pulmonary aspiration, perforation of viscus, bleeding, recurrent infection, finding a normal gallbladder, the need for additional procedures, failure to diagnose a condition, the possible need to convert to an open procedure, and creating a complication requiring transfusion or operation were discussed with the patient. The likelihood of improving the patient's symptoms with return to their baseline status is good.  The patient and/or family concurred with the proposed plan, giving informed consent. The site of surgery properly noted. The patient was taken to Operating Room, identified as Sara Wolfe and the procedure verified as Laparoscopic Cholecystectomy with Intraoperative Cholangiogram. A Time Out was held and the above information confirmed. Antibiotic prophylaxis was administered. She had a prior "sleeve gastric bypass" at outside hospital 1 year ago.   Prior to the induction of general anesthesia, antibiotic prophylaxis was administered. General endotracheal anesthesia was then administered and tolerated well. After the induction, the abdomen was prepped with Chloraprep and draped in the sterile fashion. The patient was positioned in the supine position.  Local anesthetic agent was injected into the skin near the umbilicus and an incision made. We  dissected down to the abdominal fascia with blunt dissection.  The fascia was incised vertically and we entered the peritoneal cavity bluntly.  A pursestring suture of 0-Vicryl was placed around the fascial opening.  The Hasson cannula was inserted and secured with the stay suture.  Pneumoperitoneum was then created with CO2 and tolerated well without any adverse changes in the patient's vital signs. An 5-mm port was placed in the subxiphoid position.  Two 5-mm ports were placed in the right upper quadrant. All skin incisions were infiltrated with a local anesthetic agent before making the incision and placing the trocars.   We positioned the patient in reverse Trendelenburg, tilted slightly to the patient's left.  The gallbladder was identified, the fundus grasped and retracted cephalad. Adhesions were lysed bluntly and with the electrocautery where indicated, taking care not to injure any adjacent organs or viscus. The infundibulum was grasped and retracted laterally, exposing the peritoneum overlying the triangle of Calot. This was then divided and exposed in a blunt fashion. A critical view of the cystic duct and cystic artery was obtained.  The cystic duct was clearly identified and bluntly dissected circumferentially. The cystic duct was ligated with a clip distally.   An incision was made in the cystic duct and the [facility] cholangiogram catheter introduced. There was free flow of bile from the ductotomy but I had trouble advancing the catheter despite manipulating it multiple times.  The catheter was secured using a clip but there was extravasation of saline from around the clip. I attempted to reposition but couldn't advance it any further so I decided not to proceed with the cholangiogram.    The cystic duct was then ligated with clips and divided. The cystic artery which had been identified & dissected free was ligated with clips and divided as well.   The gallbladder was dissected from the liver bed in  retrograde  fashion with the electrocautery. The gallbladder was removed and placed in an Ecco sac.  The gallbladder and Ecco sac were then removed through the umbilical port site. The liver bed was irrigated and inspected. Hemostasis was achieved with the electrocautery. Copious irrigation was utilized and was repeatedly aspirated until clear. I inspected the LUQ. She had a long tubular gastric sleeve/pouch extending to level of her transverse colon. Her roux limb was attached at the bottom this long tubular sleeve. There was no dilation of the afferent or efferent limbs.  The pursestring suture was used to close the umbilical fascia.  There was an air leak. So using a PMI suture passer, I placed 2 additional interrupted 0 vicryl sutures using laparoscopic assistance. Additional local was infiltrated.  We again inspected the right upper quadrant for hemostasis.  The umbilical closure was inspected and there was no air leak and nothing trapped within the closure. Pneumoperitoneum was released as we removed the trocars.  4-0 Monocryl was used to close the skin.   Benzoin, steri-strips, and clean dressings were applied. The patient was then extubated and brought to the recovery room in stable condition. Instrument, sponge, and needle counts were correct at closure and at the conclusion of the case.   Findings: Distended gallbladder; +critical view; sleeve extends down to level of transverse with roux limb attached.   Estimated Blood Loss: Minimal         Drains: none         Specimens: Gallbladder           Complications: None; patient tolerated the procedure well.         Disposition: PACU - hemodynamically stable.         Condition: stable  Leighton Ruff. Redmond Pulling, MD, FACS General, Bariatric, & Minimally Invasive Surgery [facility], [place]

## 2018-03-07 ENCOUNTER — Telehealth: Payer: Self-pay | Admitting: Sports Medicine

## 2018-03-07 ENCOUNTER — Encounter (HOSPITAL_COMMUNITY): Payer: Self-pay | Admitting: General Surgery

## 2018-03-07 NOTE — Telephone Encounter (Signed)
Called patient back she said she was not sure but when she spoke to the office at [place] they told her to call us and ask for labs from her PCP. She said she was going to call them and call us back.

## 2018-03-07 NOTE — Telephone Encounter (Signed)
Which labs? The surgeon just ordered labs 4 days ago.

## 2018-03-07 NOTE — Telephone Encounter (Signed)
Patient would like labs ordered prior to bariatric surgery on Tuesday Jan. 21st. Please advise.

## 2018-03-07 NOTE — Addendum Note (Signed)
Addendum  created 03/07/18 0718 by Lollie Sails, CRNA   Charge Capture section accepted

## 2018-03-13 ENCOUNTER — Ambulatory Visit (INDEPENDENT_AMBULATORY_CARE_PROVIDER_SITE_OTHER): Payer: 59 | Admitting: Sports Medicine

## 2018-03-13 ENCOUNTER — Encounter: Payer: Self-pay | Admitting: Sports Medicine

## 2018-03-13 DIAGNOSIS — K912 Postsurgical malabsorption, not elsewhere classified: Secondary | ICD-10-CM | POA: Diagnosis not present

## 2018-03-13 DIAGNOSIS — Z9884 Bariatric surgery status: Secondary | ICD-10-CM | POA: Diagnosis not present

## 2018-03-13 DIAGNOSIS — M8588 Other specified disorders of bone density and structure, other site: Secondary | ICD-10-CM | POA: Diagnosis not present

## 2018-03-13 DIAGNOSIS — G43809 Other migraine, not intractable, without status migrainosus: Secondary | ICD-10-CM | POA: Diagnosis not present

## 2018-03-13 DIAGNOSIS — M85852 Other specified disorders of bone density and structure, left thigh: Secondary | ICD-10-CM | POA: Diagnosis not present

## 2018-03-13 MED ORDER — TOPIRAMATE ER 25 MG PO CAP24: 1.0000 | ORAL_CAPSULE | Freq: Every day | ORAL | 11 refills | Status: DC

## 2018-03-13 NOTE — Assessment & Plan Note (Signed)
30 headache days per month. Unable to tolerate topiramate, starting Trokendi. Discount coupon printed.

## 2018-03-13 NOTE — Progress Notes (Signed)
Subjective:    CC: Migraines  HPI: Sara Wolfe returns, she is a pleasant 58 year old female with uncontrolled migraines, she tells me she has approximately 30 migraine days per month.  We tried Topamax and she was unable to tolerate this.  I reviewed the past medical history, family history, social history, surgical history, and allergies today and no changes were needed.  Please see the problem list section below in epic for further details.  Past Medical History: Past Medical History:  Diagnosis Date  . Gallstones   . GERD (gastroesophageal reflux disease)    history of prior to gastric bypass  . Hot flashes, menopausal   . Medical history non-contributory   . Migraine   . OSA on CPAP   . PONV (postoperative nausea and vomiting)   . Rectocele 02/13/2018  . Ventral hernia 2018   Small, noted on CT Abd/pel  . Vitamin B 12 deficiency    Past Surgical History: Past Surgical History:  Procedure Laterality Date  . ANTERIOR CERVICAL DECOMP/DISCECTOMY FUSION N/A 10/04/2013   Procedure: ANTERIOR CERVICAL DECOMPRESSION/DISCECTOMY FUSION 3 LEVELS;  Surgeon: Sinclair Ship, MD;  Location: [place];  Service: Orthopedics;  Laterality: N/A;  Anterior cervical decompression fusion cervical 4-5, cervical 5-6, cervical 6-7 with instrumentation and allograft  . ANTERIOR CERVICAL DECOMP/DISCECTOMY FUSION N/A 04/11/2014   Procedure: ANTERIOR CERVICAL DECOMPRESSION/DISCECTOMY FUSION 1 LEVEL;  Surgeon: Sinclair Ship, MD;  Location: [place];  Service: Orthopedics;  Laterality: N/A;  Anterior cervical decompression fusion, cervical 7-thoracic 1 with instrumentation and allograft  . CHOLECYSTECTOMY N/A 03/06/2018   Procedure: LAPAROSCOPIC CHOLECYSTECTOMY;  Surgeon: Greer Pickerel, MD;  Location: [facility];  Service: General;  Laterality: N/A;  . COLONOSCOPY  2017  . FACIAL COSMETIC SURGERY    . FOOT SURGERY Bilateral   . GASTRIC BYPASS    . NECK EXPLORATION  2014, 2015  . TUBAL LIGATION    . UPPER GI  ENDOSCOPY  2017   Social History: Social History   Socioeconomic History  . Marital status: Married    Spouse name: Not on file  . Number of children: 2  . Years of education: 29  . Highest education level: Not on file  Occupational History  . Occupation: [employer]  Social Needs  . Financial resource strain: Not on file  . Food insecurity:    Worry: Not on file    Inability: Not on file  . Transportation needs:    Medical: Not on file    Non-medical: Not on file  Tobacco Use  . Smoking status: Light Tobacco Smoker    Packs/day: 0.25    Years: 2.00    Pack years: 0.50    Types: Cigarettes  . Smokeless tobacco: Never Used  Substance and Sexual Activity  . Alcohol use: No  . Drug use: No  . Sexual activity: Yes    Birth control/protection: Post-menopausal  Lifestyle  . Physical activity:    Days per week: Not on file    Minutes per session: Not on file  . Stress: Not on file  Relationships  . Social connections:    Talks on phone: Not on file    Gets together: Not on file    Attends religious service: Not on file    Active member of club or organization: Not on file    Attends meetings of clubs or organizations: Not on file    Relationship status: Not on file  Other Topics Concern  . Not on file  Social History Narrative  Lives with husband   Caffeine use: tea daily   Right handed    Family History: Family History  Problem Relation Age of Onset  . Colon cancer Sister    Allergies: Allergies  Allergen Reactions  . Augmentin [Amoxicillin-Pot Clavulanate] Diarrhea   Medications: See med rec.  Review of Systems: No fevers, chills, night sweats, weight loss, chest pain, or shortness of breath.   Objective:    General: Well Developed, well nourished, and in no acute distress.  Neuro: Alert and oriented x3, extra-ocular muscles intact, sensation grossly intact.  HEENT: Normocephalic, atraumatic, pupils equal round reactive to light, neck supple, no  masses, no lymphadenopathy, thyroid nonpalpable.  Skin: Warm and dry, no rashes. Cardiac: Regular rate and rhythm, no murmurs rubs or gallops, no lower extremity edema.  Respiratory: Clear to auscultation bilaterally. Not using accessory muscles, speaking in full sentences.  Impression and Recommendations:    Migraine headache 30 headache days per month. Unable to tolerate topiramate, starting Trokendi. Discount coupon printed.  I spent 25 minutes with this patient, greater than 50% was face-to-face time counseling regarding the above diagnoses, specifically anticipatory guidance and treatment plan as well as pathophysiology for migraine headaches. ___________________________________________ Gwen Her. [name], M.D., ABFM., CAQSM. Primary Care and Sports Medicine [facility]  Adjunct Professor of [place] [employer] [facility] [employer]

## 2018-03-16 DIAGNOSIS — K912 Postsurgical malabsorption, not elsewhere classified: Secondary | ICD-10-CM | POA: Diagnosis not present

## 2018-03-16 DIAGNOSIS — Z9884 Bariatric surgery status: Secondary | ICD-10-CM | POA: Diagnosis not present

## 2018-03-16 DIAGNOSIS — G4733 Obstructive sleep apnea (adult) (pediatric): Secondary | ICD-10-CM | POA: Diagnosis not present

## 2018-03-17 IMAGING — MR MR WRIST*R* W/O CM
5 series · 40 of 40 positions shown · non-contrast
Comparison: Radiographs 09/19/2015

CLINICAL DATA: Twisting injury 6 weeks ago. Persistent ulnar sided
pain.

EXAM:
MR OF THE RIGHT WRIST WITHOUT CONTRAST
TECHNIQUE: Multiplanar, multisequence MR imaging of the right wrist was
performed. No intravenous contrast was administered.

[Series 4: T2 fat-sat · axial · right · 3.0mm · 0.28mm/px · z∈[-58,+16]mm · 12 of 25 slices shown (1 of 2)]
[im 1/25]
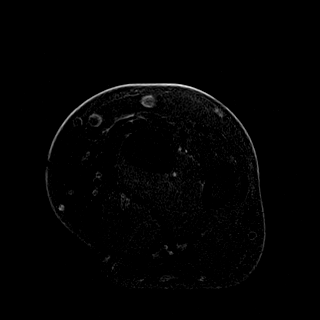
[im 3/25]
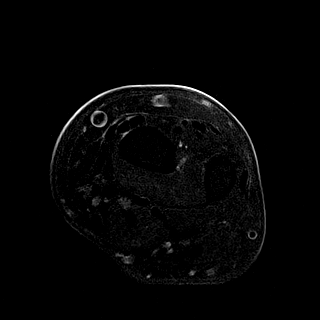
[im 5/25]
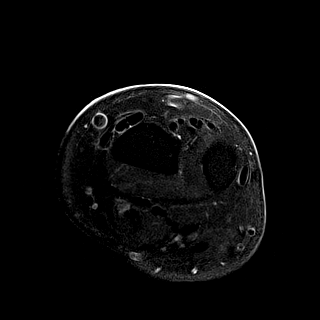
[im 7/25]
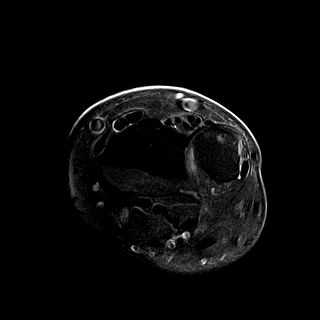
[im 9/25]
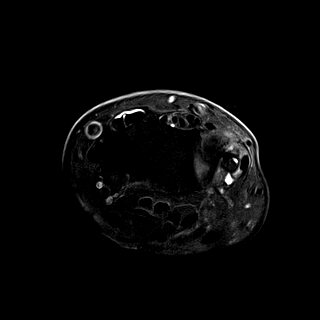
[im 11/25]
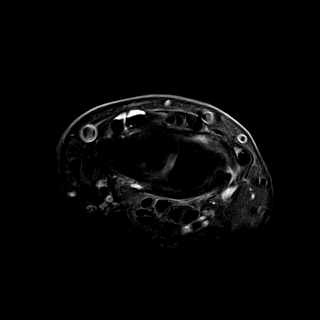
[im 14/25]
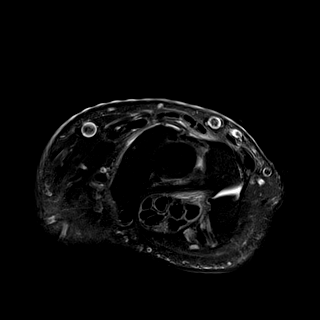
[im 16/25]
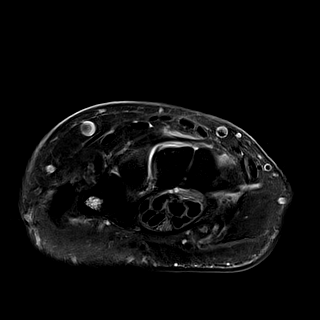
[im 18/25]
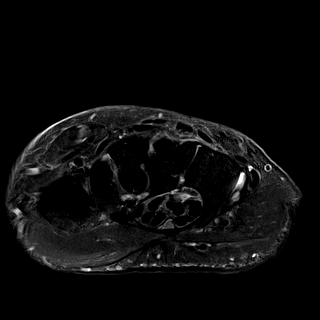
[im 20/25]
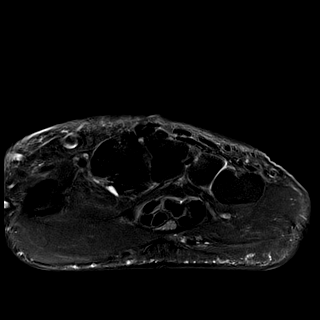
[im 22/25]
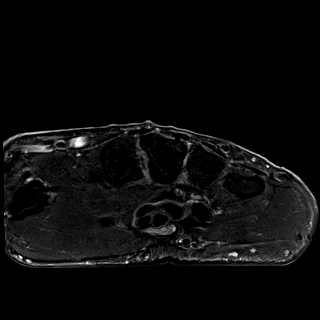
[im 25/25]
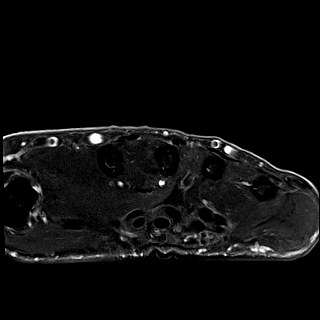

[Series 5: PD fat-sat · sagittal · right · 3.0mm · 0.28mm/px · 10 of 20 slices shown (1 of 2)]
[im 1/20]
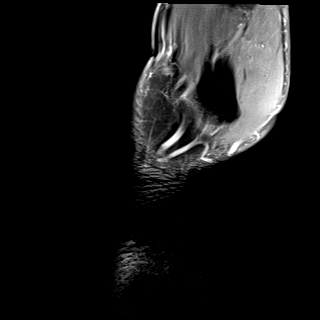
[im 3/20]
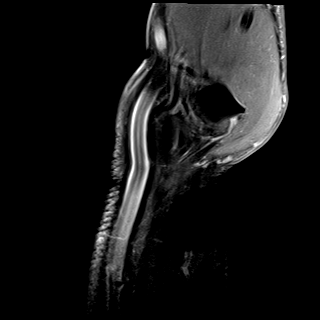
[im 5/20]
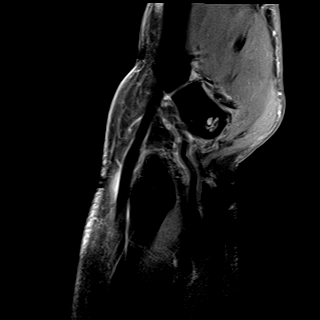
[im 7/20]
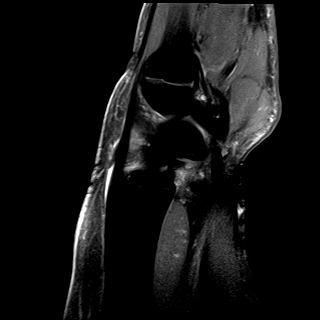
[im 9/20]
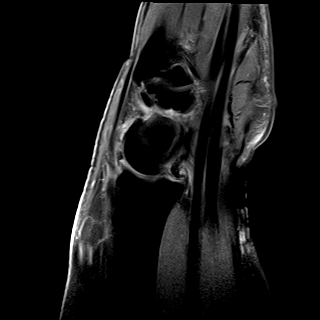
[im 11/20]
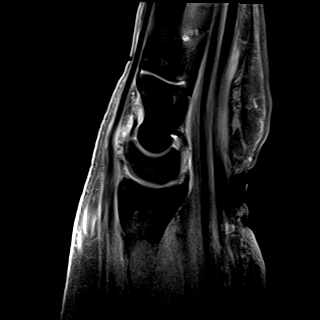
[im 13/20]
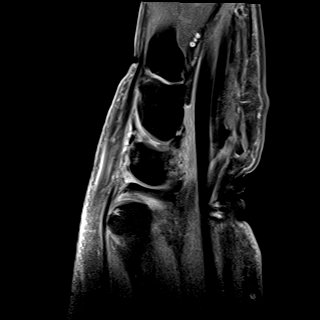
[im 15/20]
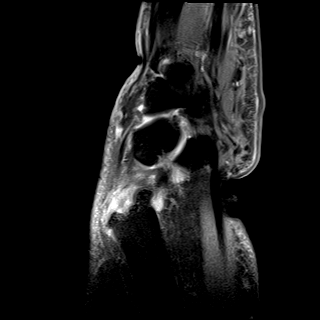
[im 17/20]
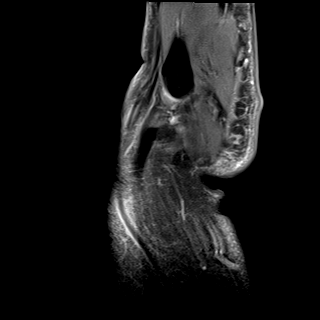
[im 20/20]
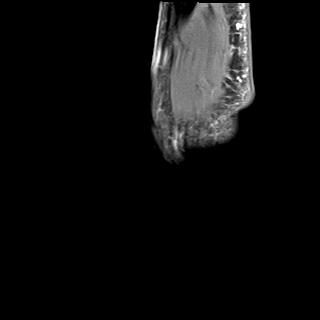

[Series 6: T2 fat-sat · coronal · right · 3.0mm · 0.28mm/px · 6 of 14 slices shown (2 of 2)]
[im 1/14]
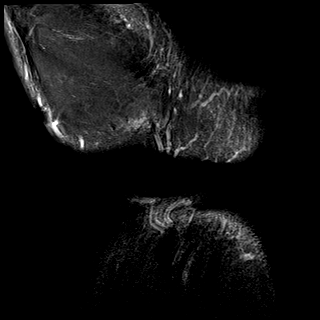
[im 3/14]
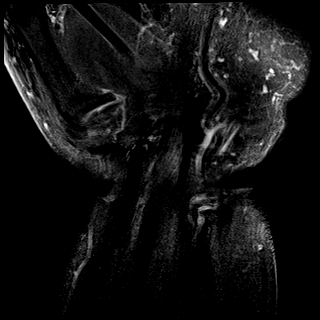
[im 6/14]
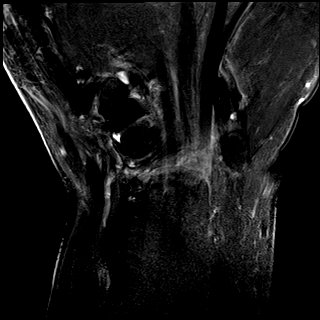
[im 8/14]
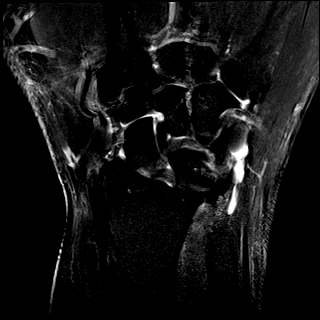
[im 11/14]
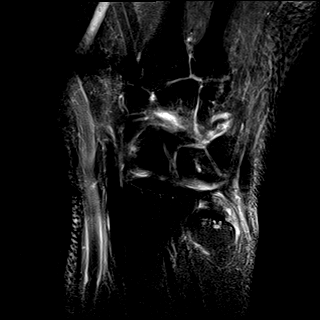
[im 14/14]
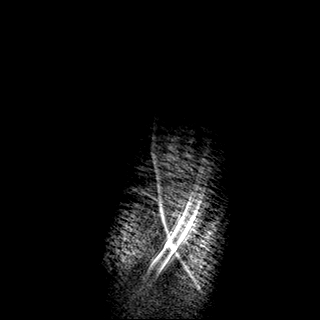

[Series 7: PD fat-sat · coronal · right · 3.0mm · 0.28mm/px · 6 of 14 slices shown (2 of 2)]
[im 1/14]
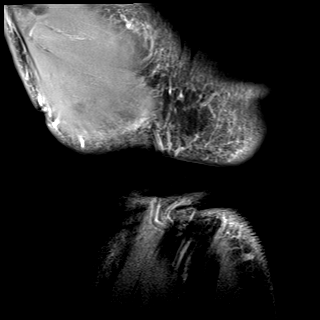
[im 3/14]
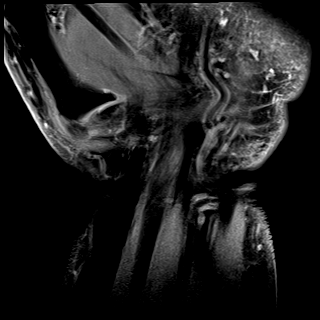
[im 6/14]
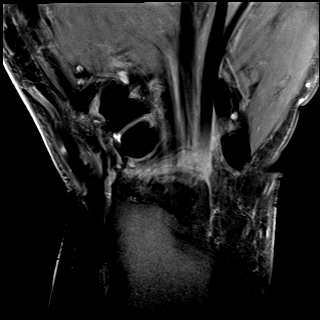
[im 8/14]
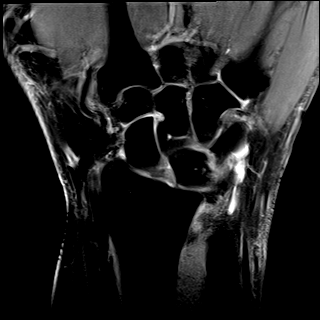
[im 11/14]
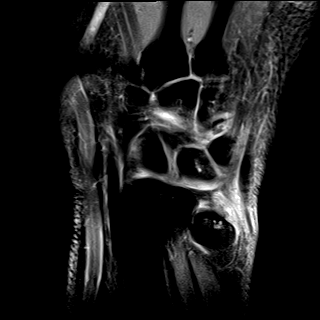
[im 14/14]
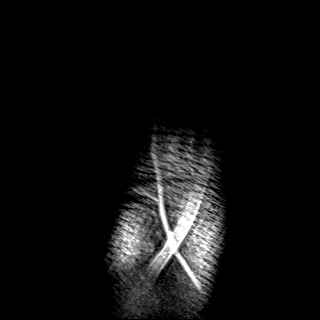

[Series 8: T1 · coronal · right · 3.0mm · 0.23mm/px · 6 of 14 slices shown]
[im 1/14]
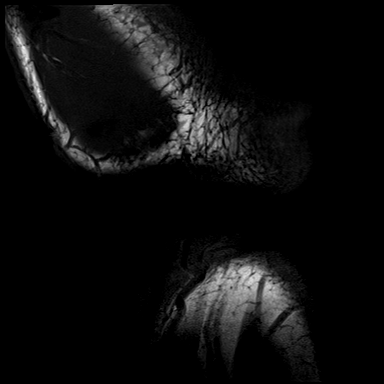
[im 3/14]
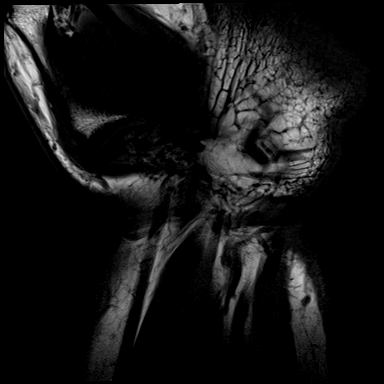
[im 6/14]
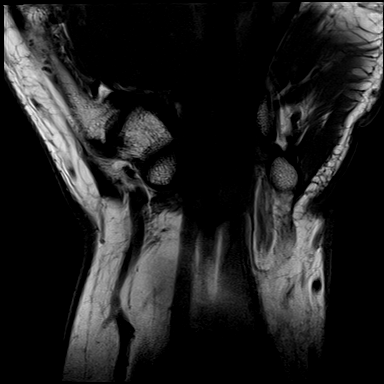
[im 8/14]
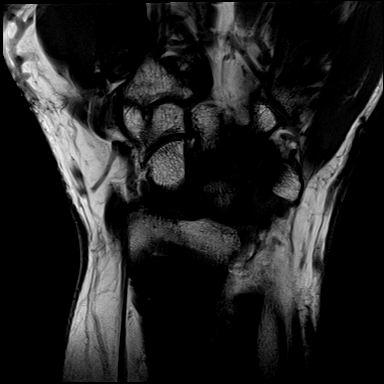
[im 11/14]
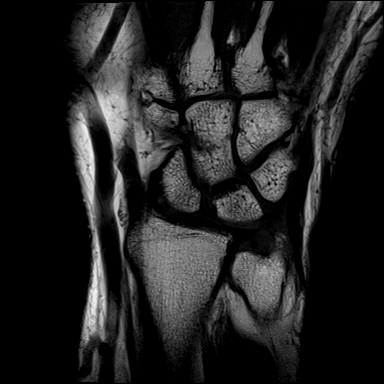
[im 14/14]
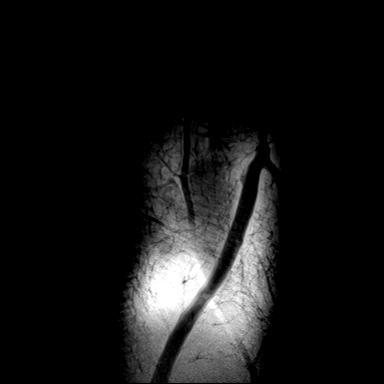

[40 of 40 positions shown; findings below may reference images not displayed]

FINDINGS: Ligaments: The intercarpal joint spaces are maintained. No obvious
ligament tear.

Triangular fibrocartilage: Intact

Tendons: Intact. Mild tenosynovitis and tendinopathy involving the
extensor carpi ulnaris. There is also mild tenosynovitis involving
the second dorsal compartment.

Carpal tunnel/median nerve: Normal

Guyon's canal: Normal

Joint/cartilage: Minimal degenerative changes. No cartilage defects
or osteochondral lesions.

Radioulnar joint effusion and probable synovitis. This could be
traumatic synovitis.

Bones/carpal alignment: No fractures identified. There arm
degenerative-type subchondral cystic changes involving the ulnar
head styloid.

Other: None
IMPRESSION: 1. Radioulnar joint effusion and probable synovitis could be a
traumatic synovitis. I do not see a fracture or TFCC tear.
2. Tendinopathy and tenosynovitis involving the extensor carpi
ulnaris.
3. The intercarpal ligaments appear intact.

## 2018-03-28 ENCOUNTER — Ambulatory Visit: Payer: 59 | Admitting: Sports Medicine

## 2018-03-28 DIAGNOSIS — G43809 Other migraine, not intractable, without status migrainosus: Secondary | ICD-10-CM | POA: Diagnosis not present

## 2018-03-28 DIAGNOSIS — G4733 Obstructive sleep apnea (adult) (pediatric): Secondary | ICD-10-CM

## 2018-03-28 DIAGNOSIS — Z903 Acquired absence of stomach [part of]: Secondary | ICD-10-CM

## 2018-03-28 DIAGNOSIS — Z9884 Bariatric surgery status: Secondary | ICD-10-CM | POA: Diagnosis not present

## 2018-03-28 DIAGNOSIS — R1013 Epigastric pain: Secondary | ICD-10-CM | POA: Diagnosis not present

## 2018-03-28 DIAGNOSIS — K912 Postsurgical malabsorption, not elsewhere classified: Secondary | ICD-10-CM | POA: Diagnosis not present

## 2018-03-28 MED ORDER — TOPIRAMATE ER 50 MG PO CAP24: 1.0000 | ORAL_CAPSULE | Freq: Every day | ORAL | 3 refills | Status: DC

## 2018-03-28 NOTE — Progress Notes (Signed)
Subjective:    CC: Follow-up  HPI: Migraines: Not much improvement with 25 Trokendi.  She still has migraines just as frequently but the severity is slightly less.  Abdominal pain: Come suddenly, goes away after a couple of minutes.  No association with any foods, activities.  Occurs in the upper quadrants in the epigastrium.  No associated melena, hematochezia, hematemesis, nausea, diarrhea.  Sleep apnea: Does well with her CPAP, she does wake up with headaches and a dry mouth, has not yet hooked up the dehumidifier.  I reviewed the past medical history, family history, social history, surgical history, and allergies today and no changes were needed.  Please see the problem list section below in epic for further details.  Past Medical History: Past Medical History:  Diagnosis Date  . Gallstones   . GERD (gastroesophageal reflux disease)    history of prior to gastric bypass  . Hot flashes, menopausal   . Medical history non-contributory   . Migraine   . OSA on CPAP   . PONV (postoperative nausea and vomiting)   . Rectocele 02/13/2018  . Ventral hernia 2018   Small, noted on CT Abd/pel  . Vitamin B 12 deficiency    Past Surgical History: Past Surgical History:  Procedure Laterality Date  . ANTERIOR CERVICAL DECOMP/DISCECTOMY FUSION N/A 10/04/2013   Procedure: ANTERIOR CERVICAL DECOMPRESSION/DISCECTOMY FUSION 3 LEVELS;  Surgeon: Sinclair Ship, MD;  Location: [place];  Service: Orthopedics;  Laterality: N/A;  Anterior cervical decompression fusion cervical 4-5, cervical 5-6, cervical 6-7 with instrumentation and allograft  . ANTERIOR CERVICAL DECOMP/DISCECTOMY FUSION N/A 04/11/2014   Procedure: ANTERIOR CERVICAL DECOMPRESSION/DISCECTOMY FUSION 1 LEVEL;  Surgeon: Sinclair Ship, MD;  Location: [place];  Service: Orthopedics;  Laterality: N/A;  Anterior cervical decompression fusion, cervical 7-thoracic 1 with instrumentation and allograft  . CHOLECYSTECTOMY N/A 03/06/2018     Procedure: LAPAROSCOPIC CHOLECYSTECTOMY;  Surgeon: Greer Pickerel, MD;  Location: [facility];  Service: General;  Laterality: N/A;  . COLONOSCOPY  2017  . FACIAL COSMETIC SURGERY    . FOOT SURGERY Bilateral   . GASTRIC BYPASS    . NECK EXPLORATION  2014, 2015  . TUBAL LIGATION    . UPPER GI ENDOSCOPY  2017   Social History: Social History   Socioeconomic History  . Marital status: Married    Spouse name: Not on file  . Number of children: 2  . Years of education: 33  . Highest education level: Not on file  Occupational History  . Occupation: [employer]  Social Needs  . Financial resource strain: Not on file  . Food insecurity:    Worry: Not on file    Inability: Not on file  . Transportation needs:    Medical: Not on file    Non-medical: Not on file  Tobacco Use  . Smoking status: Light Tobacco Smoker    Packs/day: 0.25    Years: 2.00    Pack years: 0.50    Types: Cigarettes  . Smokeless tobacco: Never Used  Substance and Sexual Activity  . Alcohol use: No  . Drug use: No  . Sexual activity: Yes    Birth control/protection: Post-menopausal  Lifestyle  . Physical activity:    Days per week: Not on file    Minutes per session: Not on file  . Stress: Not on file  Relationships  . Social connections:    Talks on phone: Not on file    Gets together: Not on file    Attends  religious service: Not on file    Active member of club or organization: Not on file    Attends meetings of clubs or organizations: Not on file    Relationship status: Not on file  Other Topics Concern  . Not on file  Social History Narrative   Lives with husband   Caffeine use: tea daily   Right handed    Family History: Family History  Problem Relation Age of Onset  . Colon cancer Sister    Allergies: Allergies  Allergen Reactions  . Augmentin [Amoxicillin-Pot Clavulanate] Diarrhea   Medications: See med rec.  Review of Systems: No fevers, chills, night sweats, weight loss, chest  pain, or shortness of breath.   Objective:    General: Well Developed, well nourished, and in no acute distress.  Neuro: Alert and oriented x3, extra-ocular muscles intact, sensation grossly intact.  HEENT: Normocephalic, atraumatic, pupils equal round reactive to light, neck supple, no masses, no lymphadenopathy, thyroid nonpalpable.  Skin: Warm and dry, no rashes. Cardiac: Regular rate and rhythm, no murmurs rubs or gallops, no lower extremity edema.  Respiratory: Clear to auscultation bilaterally. Not using accessory muscles, speaking in full sentences.  Impression and Recommendations:    Migraine headache Still has 30 headache days per month. Increasing Trokendi to 50 mg taken in the morning. Continue to document migraines, return in a month for this.  Obstructive sleep apnea Doing well with CPAP. She does wake up with a headache and dry mouth, she will add humidification.  History of sleeve gastrectomy Having flares of severe short-lived epigastric and upper abdominal discomfort, resolves as soon as it comes. No symptoms of obstruction, no nausea, vomiting, melena hematochezia. 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 ___________________________________________ Gwen Her. [name], M.D., ABFM., CAQSM. Primary Care and Sports Medicine [facility]  Adjunct Professor of [place] [employer] [facility] of Medicine

## 2018-03-28 NOTE — Assessment & Plan Note (Signed)
Still has 30 headache days per month. Increasing Trokendi to 50 mg taken in the morning. Continue to document migraines, return in a month for this.

## 2018-03-28 NOTE — Assessment & Plan Note (Signed)
Doing well with CPAP. She does wake up with a headache and dry mouth, she will add humidification.

## 2018-03-28 NOTE — Assessment & Plan Note (Signed)
Having flares of severe short-lived epigastric and upper abdominal discomfort, resolves as soon as it comes. No symptoms of obstruction, no nausea, vomiting, melena hematochezia. 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

## 2018-04-01 ENCOUNTER — Other Ambulatory Visit: Payer: Self-pay | Admitting: Sports Medicine

## 2018-04-03 ENCOUNTER — Emergency Department
Admission: EM | Admit: 2018-04-03 | Discharge: 2018-04-03 | Disposition: A | Discharge status: Home / Self Care | Payer: 59 | Source: Home / Self Care | Attending: Family Medicine | Admitting: Family Medicine

## 2018-04-03 ENCOUNTER — Other Ambulatory Visit: Payer: Self-pay | Admitting: Sports Medicine

## 2018-04-03 ENCOUNTER — Encounter: Payer: Self-pay | Admitting: *Deleted

## 2018-04-03 DIAGNOSIS — J069 Acute upper respiratory infection, unspecified: Secondary | ICD-10-CM | POA: Diagnosis not present

## 2018-04-03 DIAGNOSIS — M5416 Radiculopathy, lumbar region: Secondary | ICD-10-CM

## 2018-04-03 NOTE — ED Triage Notes (Signed)
Facial pain, nasal discharge, nausea, x3-4 days. 3 weeks post-op cholecystectomy.

## 2018-04-03 NOTE — ED Provider Notes (Signed)
[facility]    CSN: [id number] Arrival date & time: 04/03/18  1316     History   Chief Complaint Chief Complaint  Patient presents with  . Facial Pain  . Nausea  . Nasal Congestion    HPI Sara Wolfe is a 58 y.o. female.   Two days ago patient developed sinus congestion and fatigue.  Last night she developed mild sore throat.  No cough, fever, or myalgias.  The history is provided by the patient.    Past Medical History:  Diagnosis Date  . Gallstones   . GERD (gastroesophageal reflux disease)    history of prior to gastric bypass  . Hot flashes, menopausal   . Medical history non-contributory   . Migraine   . OSA on CPAP   . PONV (postoperative nausea and vomiting)   . Rectocele 02/13/2018  . Ventral hernia 2018   Small, noted on CT Abd/pel  . Vitamin B 12 deficiency     Patient Active Problem List   Diagnosis Date Noted  . Rectocele 01/11/2018  . Decreased libido 01/11/2018  . Eyelash scantiness 08/08/2017  . History of facelift 06/13/2017  . Memory change 02/07/2017  . Dysarthria 01/17/2017  . Vitamin B 12 deficiency 12/17/2016  . Abdominal pain, right upper quadrant 08/19/2016  . Chronic pain syndrome 10/16/2015  . Hyperlipidemia 05/20/2015  . Migraine headache 05/16/2015  . Obstructive sleep apnea 05/16/2015  . Lateral epicondylitis of left elbow 05/09/2015  . Left lumbar radiculitis 04/11/2014  . Annual physical exam 02/21/2014  . 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 S/P cervical spinal fusion 10/04/2013  . Hot flash, menopausal 12/27/2011    Past Surgical History:  Procedure Laterality Date  . ANTERIOR CERVICAL DECOMP/DISCECTOMY FUSION N/A 10/04/2013   Procedure: ANTERIOR CERVICAL DECOMPRESSION/DISCECTOMY FUSION 3 LEVELS;  Surgeon: Sinclair Ship, MD;  Location: [place];  Service: Orthopedics;  Laterality: N/A;  Anterior cervical decompression fusion cervical 4-5, cervical 5-6, cervical 6-7 with instrumentation and  allograft  . ANTERIOR CERVICAL DECOMP/DISCECTOMY FUSION N/A 04/11/2014   Procedure: ANTERIOR CERVICAL DECOMPRESSION/DISCECTOMY FUSION 1 LEVEL;  Surgeon: Sinclair Ship, MD;  Location: [place];  Service: Orthopedics;  Laterality: N/A;  Anterior cervical decompression fusion, cervical 7-thoracic 1 with instrumentation and allograft  . CHOLECYSTECTOMY N/A 03/06/2018   Procedure: LAPAROSCOPIC CHOLECYSTECTOMY;  Surgeon: Greer Pickerel, MD;  Location: [facility];  Service: General;  Laterality: N/A;  . COLONOSCOPY  2017  . FACIAL COSMETIC SURGERY    . FOOT SURGERY Bilateral   . GASTRIC BYPASS    . NECK EXPLORATION  2014, 2015  . TUBAL LIGATION    . UPPER GI ENDOSCOPY  2017    OB History    Gravida  2   Para  2   Term  2   Preterm      AB      Living  2     SAB      TAB      Ectopic      Multiple      Live Births               Home Medications    Prior to Admission medications   Medication Sig Start Date End Date Taking? Authorizing Provider  AMBULATORY NON FORMULARY MEDICATION Continuous positive airway pressure (CPAP) machine autopap 5-21 cm of H2O pressure (or autoPAP if available), with all supplemental supplies as needed.  To [employer]. AHI=15 12/16/16   Silverio Decamp, MD  bimatoprost (LATISSE) 0.03 %  ophthalmic solution Place 1gtt on applicator and apply evenly along the skin of the upper eyelid at base of eyelashes qhs; use new applicator for 2nd eye Patient taking differently: Place 1 drop into both eyes at bedtime. Place 1gtt on applicator and apply evenly along the skin of the upper eyelid at base of eyelashes qhs; use new applicator for 2nd eye 3/66/29   Silverio Decamp, MD  cyanocobalamin (,VITAMIN B-12,) 1000 MCG/ML injection Inject 1,000 mcg into the muscle every 30 (thirty) days. Inject 1000 mcg weekly intramuscular for 1 month then monthly thereafter 12/17/16   Silverio Decamp, MD  DULoxetine (CYMBALTA) 60 MG capsule TAKE 1 CAPSULE BY MOUTH  EVERY DAY Patient taking differently: Take 60 mg by mouth daily.  11/24/17   Silverio Decamp, MD  Estradiol 10 MCG TABS vaginal tablet 1 tablet per vagina 2 nights per week. Patient taking differently: Place 10 mcg vaginally 2 (two) times a week. 1 tablet per vagina 2 nights per week. 02/13/18   Dove, Wilhemina Cash, MD  Flibanserin (ADDYI) 100 MG TABS Take 1 tablet by mouth at bedtime. Patient taking differently: Take 100 mg by mouth at bedtime.  02/13/18   Emily Filbert, MD  folic acid (FOLVITE) 1 MG tablet TAKE 5 TABLETS (5 MG TOTAL) BY MOUTH DAILY. 02/21/17   Silverio Decamp, MD  methocarbamol (ROBAXIN) 500 MG tablet TAKE 1 TABLET BY MOUTH THREE TIMES A DAY 04/02/18   Silverio Decamp, MD  pregabalin (LYRICA) 200 MG capsule Take 1 capsule (200 mg total) by mouth 3 (three) times daily. 02/02/18   Silverio Decamp, MD  rizatriptan (MAXALT-MLT) 10 MG disintegrating tablet TAKE 1 TABLET (10 MG TOTAL) BY MOUTH AS NEEDED FOR MIGRAINE. MAY REPEAT IN 2 HOURS IF NEEDED 02/21/16   Silverio Decamp, MD  Topiramate ER (TROKENDI XR) 50 MG CP24 Take 1 capsule by mouth daily. 03/28/18   Silverio Decamp, MD  traMADol (ULTRAM) 50 MG tablet TAKE 1 TABLET BY MOUTH EVERY 8 HOURS AS NEEDED FOR PAIN 04/03/18   Silverio Decamp, MD  Vitamin D, Ergocalciferol, (DRISDOL) 50000 units CAPS capsule Take 1 capsule (50,000 Units total) by mouth every 7 (seven) days. 10/28/17   Emily Filbert, MD    Family History Family History  Problem Relation Age of Onset  . Healthy Mother   . Healthy Father   . Colon cancer Sister     Social History Social History   Tobacco Use  . Smoking status: Light Tobacco Smoker    Packs/day: 0.25    Years: 2.00    Pack years: 0.50    Types: Cigarettes  . Smokeless tobacco: Never Used  Substance Use Topics  . Alcohol use: No  . Drug use: No     Allergies   Augmentin [amoxicillin-pot clavulanate]   Review of Systems Review of Systems + sore  throat + cough No pleuritic pain No wheezing + nasal congestion + post-nasal drainage ? sinus pain/pressure No itchy/red eyes No earache No hemoptysis No SOB No fever, + chills No nausea No vomiting No abdominal pain No diarrhea No urinary symptoms No skin rash + fatigue No myalgias + headache Used OTC meds without relief   Physical Exam Triage Vital Signs ED Triage Vitals  Enc Vitals Group     BP 04/03/18 1340 111/78     Pulse Rate 04/03/18 1340 62     Resp 04/03/18 1340 16     Temp 04/03/18 1340 98.6 F (37  C)     Temp Source 04/03/18 1340 Oral     SpO2 04/03/18 1340 98 %     Weight 04/03/18 1341 155 lb (70.3 kg)     Height 04/03/18 1341 5\' 2"  (1.575 m)     Head Circumference --      Peak Flow --      Pain Score 04/03/18 1340 3     Pain Loc --      Pain Edu? --      Excl. in [place]? --    No data found.  Updated Vital Signs BP 111/78 (BP Location: Right Arm)   Pulse 62   Temp 98.6 F (37 C) (Oral)   Resp 16   Ht 5\' 2"  (1.575 m)   Wt 70.3 kg   SpO2 98%   BMI 28.35 kg/m   Visual Acuity Right Eye Distance:   Left Eye Distance:   Bilateral Distance:    Right Eye Near:   Left Eye Near:    Bilateral Near:     Physical Exam Nursing notes and Vital Signs reviewed. Appearance:  Patient appears stated age, and in no acute distress Eyes:  Pupils are equal, round, and reactive to light and accomodation.  Extraocular movement is intact.  Conjunctivae are not inflamed  Ears:  Canals normal.  Tympanic membranes normal.  Nose:  Mildly congested turbinates.  No sinus tenderness.  Pharynx:  Normal Neck:  Supple.  Enlarged posterior/lateral nodes are palpated bilaterally, tender to palpation on the left.   Lungs:  Clear to auscultation.  Breath sounds are equal.  Moving air well. Heart:  Regular rate and rhythm without murmurs, rubs, or gallops.  Abdomen:  Nontender without masses or hepatosplenomegaly.  Bowel sounds are present.  No CVA or flank tenderness.   Extremities:  No edema.  Skin:  No rash present.    UC Treatments / Results  Labs (all labs ordered are listed, but only abnormal results are displayed) Labs Reviewed - No data to display  EKG None  Radiology No results found.  Procedures Procedures (including critical care time)  Medications Ordered in UC Medications - No data to display  Initial Impression / Assessment and Plan / UC Course  I have reviewed the triage vital signs and the nursing notes.  Pertinent labs & imaging results that were available during my care of the patient were reviewed by me and considered in my medical decision making (see chart for details).    There is no evidence of bacterial infection today.  Treat symptomatically for now.  Followup with Family Doctor if not improved in 7 to 10 days.  Final Clinical Impressions(s) / UC Diagnoses   Final diagnoses:  Viral URI     Discharge Instructions     Take plain guaifenesin (1200mg  extended release tabs such as Mucinex) twice daily, with plenty of water, for cough and congestion.  May add Pseudoephedrine (30mg , one or two every 4 to 6 hours) for sinus congestion.  Get adequate rest.   May use Afrin nasal spray (or generic oxymetazoline) each morning for about 5 days and then discontinue.  Also recommend using saline nasal spray several times daily and saline nasal irrigation (AYR is a common brand).  Use Flonase nasal spray each morning after using Afrin nasal spray and saline nasal irrigation. Try warm salt water gargles for sore throat.  Stop all antihistamines for now, and other non-prescription cough/cold preparations. May take Ibuprofen 200mg , 4 tabs every 8 hours with food for  fever, body aches, etc. May take Delsym Cough Suppressant at bedtime for nighttime cough.    ED Prescriptions    None         Kandra Nicolas, MD 04/03/18 1540

## 2018-04-03 NOTE — Discharge Instructions (Addendum)
Take plain guaifenesin (1200mg  extended release tabs such as Mucinex) twice daily, with plenty of water, for cough and congestion.  May add Pseudoephedrine (30mg , one or two every 4 to 6 hours) for sinus congestion.  Get adequate rest.   May use Afrin nasal spray (or generic oxymetazoline) each morning for about 5 days and then discontinue.  Also recommend using saline nasal spray several times daily and saline nasal irrigation (AYR is a common brand).  Use Flonase nasal spray each morning after using Afrin nasal spray and saline nasal irrigation. Try warm salt water gargles for sore throat.  Stop all antihistamines for now, and other non-prescription cough/cold preparations. May take Ibuprofen 200mg , 4 tabs every 8 hours with food for fever, body aches, etc. May take Delsym Cough Suppressant at bedtime for nighttime cough.

## 2018-04-10 ENCOUNTER — Ambulatory Visit: Payer: 59 | Admitting: Sports Medicine

## 2018-04-10 ENCOUNTER — Encounter: Payer: Self-pay | Admitting: Sports Medicine

## 2018-04-10 VITALS — BP 106/67 | HR 56 | Ht 62.0 in | Wt 157.0 lb

## 2018-04-10 DIAGNOSIS — G894 Chronic pain syndrome: Secondary | ICD-10-CM

## 2018-04-10 DIAGNOSIS — E538 Deficiency of other specified B group vitamins: Secondary | ICD-10-CM

## 2018-04-10 DIAGNOSIS — R1011 Right upper quadrant pain: Secondary | ICD-10-CM | POA: Diagnosis not present

## 2018-04-10 MED ORDER — CYANOCOBALAMIN 1000 MCG/ML IJ SOLN
1000.0000 ug | Freq: Once | INTRAMUSCULAR | Status: AC
  Administered 2018-04-10: 1000 ug via INTRAMUSCULAR

## 2018-04-10 MED ORDER — AMITRIPTYLINE HCL 50 MG PO TABS: ORAL_TABLET | ORAL | 3 refills | Status: DC

## 2018-04-10 NOTE — Assessment & Plan Note (Signed)
Has seen her bariatric surgeon, they do suspect this is dyspepsia/peptic ulcer disease. Discontinue ibuprofen for now.

## 2018-04-10 NOTE — Progress Notes (Signed)
Subjective:    CC: Follow-up  HPI: Chronic pain syndrome: We are getting closer to controlling her pain, it was relatively well controlled with ibuprofen, Lyrica 200 mg 3 times daily, tramadol 3 times daily.  Unfortunately she has developed peptic ulcer disease, so we stopped her ibuprofen.  Because of this her symptoms have worsened.  Mild, worsening, localized throughout without radiation.  I reviewed the past medical history, family history, social history, surgical history, and allergies today and no changes were needed.  Please see the problem list section below in epic for further details.  Past Medical History: Past Medical History:  Diagnosis Date  . Gallstones   . GERD (gastroesophageal reflux disease)    history of prior to gastric bypass  . Hot flashes, menopausal   . Medical history non-contributory   . Migraine   . OSA on CPAP   . PONV (postoperative nausea and vomiting)   . Rectocele 02/13/2018  . Ventral hernia 2018   Small, noted on CT Abd/pel  . Vitamin B 12 deficiency    Past Surgical History: Past Surgical History:  Procedure Laterality Date  . ANTERIOR CERVICAL DECOMP/DISCECTOMY FUSION N/A 10/04/2013   Procedure: ANTERIOR CERVICAL DECOMPRESSION/DISCECTOMY FUSION 3 LEVELS;  Surgeon: Sinclair Ship, MD;  Location: [facility];  Service: Orthopedics;  Laterality: N/A;  Anterior cervical decompression fusion cervical 4-5, cervical 5-6, cervical 6-7 with instrumentation and allograft  . ANTERIOR CERVICAL DECOMP/DISCECTOMY FUSION N/A 04/11/2014   Procedure: ANTERIOR CERVICAL DECOMPRESSION/DISCECTOMY FUSION 1 LEVEL;  Surgeon: Sinclair Ship, MD;  Location: [place];  Service: Orthopedics;  Laterality: N/A;  Anterior cervical decompression fusion, cervical 7-thoracic 1 with instrumentation and allograft  . CHOLECYSTECTOMY N/A 03/06/2018   Procedure: LAPAROSCOPIC CHOLECYSTECTOMY;  Surgeon: Greer Pickerel, MD;  Location: [facility];  Service: General;  Laterality: N/A;  .  COLONOSCOPY  2017  . FACIAL COSMETIC SURGERY    . FOOT SURGERY Bilateral   . GASTRIC BYPASS    . NECK EXPLORATION  2014, 2015  . TUBAL LIGATION    . UPPER GI ENDOSCOPY  2017   Social History: Social History   Socioeconomic History  . Marital status: Married    Spouse name: Not on file  . Number of children: 2  . Years of education: 31  . Highest education level: Not on file  Occupational History  . Occupation: [employer]  Social Needs  . Financial resource strain: Not on file  . Food insecurity:    Worry: Not on file    Inability: Not on file  . Transportation needs:    Medical: Not on file    Non-medical: Not on file  Tobacco Use  . Smoking status: Light Tobacco Smoker    Packs/day: 0.25    Years: 2.00    Pack years: 0.50    Types: Cigarettes  . Smokeless tobacco: Never Used  Substance and Sexual Activity  . Alcohol use: No  . Drug use: No  . Sexual activity: Yes    Birth control/protection: Post-menopausal  Lifestyle  . Physical activity:    Days per week: Not on file    Minutes per session: Not on file  . Stress: Not on file  Relationships  . Social connections:    Talks on phone: Not on file    Gets together: Not on file    Attends religious service: Not on file    Active member of club or organization: Not on file    Attends meetings of clubs or organizations: Not  on file    Relationship status: Not on file  Other Topics Concern  . Not on file  Social History Narrative   Lives with husband   Caffeine use: tea daily   Right handed    Family History: Family History  Problem Relation Age of Onset  . Healthy Mother   . Healthy Father   . Colon cancer Sister    Allergies: Allergies  Allergen Reactions  . Augmentin [Amoxicillin-Pot Clavulanate] Diarrhea   Medications: See med rec.  Review of Systems: No fevers, chills, night sweats, weight loss, chest pain, or shortness of breath.   Objective:    General: Well Developed, well nourished, and  in no acute distress.  Neuro: Alert and oriented x3, extra-ocular muscles intact, sensation grossly intact.  HEENT: Normocephalic, atraumatic, pupils equal round reactive to light, neck supple, no masses, no lymphadenopathy, thyroid nonpalpable.  Skin: Warm and dry, no rashes. Cardiac: Regular rate and rhythm, no murmurs rubs or gallops, no lower extremity edema.  Respiratory: Clear to auscultation bilaterally. Not using accessory muscles, speaking in full sentences.  Impression and Recommendations:    Chronic pain syndrome Continue Lyrica 200 mg 3 times daily. Unable to use ibuprofen now, has some dyspepsia. Continue tramadol 3 times daily and adding amitriptyline 25 to 50 mg at bedtime.  FMLA paperwork filled out so that she can take her father to appointments.  Abdominal pain, right upper quadrant Has seen her bariatric surgeon, they do suspect this is dyspepsia/peptic ulcer disease. Discontinue ibuprofen for now.  I spent 25 minutes with this patient, greater than 50% was face-to-face time counseling regarding the above diagnoses, specifically diagnosis, treatment options and prognosis as well as anticipatory guidance regarding chronic pain syndrome. ___________________________________________ Gwen Her. [name], M.D., ABFM., CAQSM. Primary Care and Sports Medicine [place] [facility]  Adjunct Professor of [place] [employer] [facility] [employer]

## 2018-04-10 NOTE — Assessment & Plan Note (Signed)
Continue Lyrica 200 mg 3 times daily. Unable to use ibuprofen now, has some dyspepsia. Continue tramadol 3 times daily and adding amitriptyline 25 to 50 mg at bedtime.  FMLA paperwork filled out so that she can take her father to appointments.

## 2018-04-17 ENCOUNTER — Other Ambulatory Visit: Payer: Self-pay | Admitting: Sports Medicine

## 2018-04-25 ENCOUNTER — Ambulatory Visit: Payer: Self-pay | Admitting: Sports Medicine

## 2018-05-03 ENCOUNTER — Other Ambulatory Visit: Payer: Self-pay | Admitting: Sports Medicine

## 2018-05-03 DIAGNOSIS — G894 Chronic pain syndrome: Secondary | ICD-10-CM

## 2018-05-04 ENCOUNTER — Other Ambulatory Visit: Payer: Self-pay | Admitting: Sports Medicine

## 2018-05-04 DIAGNOSIS — M5416 Radiculopathy, lumbar region: Secondary | ICD-10-CM

## 2018-05-10 ENCOUNTER — Other Ambulatory Visit: Payer: Self-pay | Admitting: Sports Medicine

## 2018-05-10 DIAGNOSIS — Z981 Arthrodesis status: Secondary | ICD-10-CM

## 2018-05-12 ENCOUNTER — Ambulatory Visit: Payer: 59 | Admitting: Sports Medicine

## 2018-05-12 ENCOUNTER — Encounter: Payer: Self-pay | Admitting: Sports Medicine

## 2018-05-12 ENCOUNTER — Other Ambulatory Visit: Payer: Self-pay

## 2018-05-12 VITALS — BP 124/73 | HR 97 | Ht 62.0 in | Wt 162.0 lb

## 2018-05-12 DIAGNOSIS — E538 Deficiency of other specified B group vitamins: Secondary | ICD-10-CM

## 2018-05-12 DIAGNOSIS — M51369 Other intervertebral disc degeneration, lumbar region without mention of lumbar back pain or lower extremity pain: Secondary | ICD-10-CM

## 2018-05-12 DIAGNOSIS — Z903 Acquired absence of stomach [part of]: Secondary | ICD-10-CM | POA: Diagnosis not present

## 2018-05-12 DIAGNOSIS — M5136 Other intervertebral disc degeneration, lumbar region: Secondary | ICD-10-CM

## 2018-05-12 MED ORDER — CYANOCOBALAMIN 1000 MCG/ML IJ SOLN
1000.0000 ug | Freq: Once | INTRAMUSCULAR | Status: AC
  Administered 2018-05-12: 1000 ug via INTRAMUSCULAR

## 2018-05-12 MED ORDER — PREDNISONE 50 MG PO TABS: ORAL_TABLET | ORAL | 0 refills | Status: DC

## 2018-05-12 MED ORDER — PHENTERMINE HCL 37.5 MG PO TABS: ORAL_TABLET | ORAL | 0 refills | Status: DC

## 2018-05-12 NOTE — Progress Notes (Signed)
Subjective:    CC: Multiple issues  HPI: Right thigh pain: Present for several weeks, history of L4-L5 DDD.  Symptoms are moderate, persistent, worse with prolonged standing.  No gelling, no stiffness, no trauma.  Nothing radiating past the knee.  No bowel or bladder dysfunction, saddle numbness, constitutional symptoms.  Obesity: History of sleeve gastrectomy, putting on weight again, she desires a bit of phentermine.  I reviewed the past medical history, family history, social history, surgical history, and allergies today and no changes were needed.  Please see the problem list section below in epic for further details.  Past Medical History: Past Medical History:  Diagnosis Date  . Gallstones   . GERD (gastroesophageal reflux disease)    history of prior to gastric bypass  . Hot flashes, menopausal   . Medical history non-contributory   . Migraine   . OSA on CPAP   . PONV (postoperative nausea and vomiting)   . Rectocele 02/13/2018  . Ventral hernia 2018   Small, noted on CT Abd/pel  . Vitamin B 12 deficiency    Past Surgical History: Past Surgical History:  Procedure Laterality Date  . ANTERIOR CERVICAL DECOMP/DISCECTOMY FUSION N/A 10/04/2013   Procedure: ANTERIOR CERVICAL DECOMPRESSION/DISCECTOMY FUSION 3 LEVELS;  Surgeon: Sinclair Ship, MD;  Location: [place];  Service: Orthopedics;  Laterality: N/A;  Anterior cervical decompression fusion cervical 4-5, cervical 5-6, cervical 6-7 with instrumentation and allograft  . ANTERIOR CERVICAL DECOMP/DISCECTOMY FUSION N/A 04/11/2014   Procedure: ANTERIOR CERVICAL DECOMPRESSION/DISCECTOMY FUSION 1 LEVEL;  Surgeon: Sinclair Ship, MD;  Location: [place];  Service: Orthopedics;  Laterality: N/A;  Anterior cervical decompression fusion, cervical 7-thoracic 1 with instrumentation and allograft  . CHOLECYSTECTOMY N/A 03/06/2018   Procedure: LAPAROSCOPIC CHOLECYSTECTOMY;  Surgeon: Greer Pickerel, MD;  Location: [facility];  Service:  General;  Laterality: N/A;  . COLONOSCOPY  2017  . FACIAL COSMETIC SURGERY    . FOOT SURGERY Bilateral   . GASTRIC BYPASS    . NECK EXPLORATION  2014, 2015  . TUBAL LIGATION    . UPPER GI ENDOSCOPY  2017   Social History: Social History   Socioeconomic History  . Marital status: Married    Spouse name: Not on file  . Number of children: 2  . Years of education: 70  . Highest education level: Not on file  Occupational History  . Occupation: [employer]  Social Needs  . Financial resource strain: Not on file  . Food insecurity:    Worry: Not on file    Inability: Not on file  . Transportation needs:    Medical: Not on file    Non-medical: Not on file  Tobacco Use  . Smoking status: Light Tobacco Smoker    Packs/day: 0.25    Years: 2.00    Pack years: 0.50    Types: Cigarettes  . Smokeless tobacco: Never Used  Substance and Sexual Activity  . Alcohol use: No  . Drug use: No  . Sexual activity: Yes    Birth control/protection: Post-menopausal  Lifestyle  . Physical activity:    Days per week: Not on file    Minutes per session: Not on file  . Stress: Not on file  Relationships  . Social connections:    Talks on phone: Not on file    Gets together: Not on file    Attends religious service: Not on file    Active member of club or organization: Not on file    Attends meetings of  clubs or organizations: Not on file    Relationship status: Not on file  Other Topics Concern  . Not on file  Social History Narrative   Lives with husband   Caffeine use: tea daily   Right handed    Family History: Family History  Problem Relation Age of Onset  . Healthy Mother   . Healthy Father   . Colon cancer Sister    Allergies: Allergies  Allergen Reactions  . Augmentin [Amoxicillin-Pot Clavulanate] Diarrhea   Medications: See med rec.  Review of Systems: No fevers, chills, night sweats, weight loss, chest pain, or shortness of breath.   Objective:    General: Well  Developed, well nourished, and in no acute distress.  Neuro: Alert and oriented x3, extra-ocular muscles intact, sensation grossly intact.  HEENT: Normocephalic, atraumatic, pupils equal round reactive to light, neck supple, no masses, no lymphadenopathy, thyroid nonpalpable.  Skin: Warm and dry, no rashes. Cardiac: Regular rate and rhythm, no murmurs rubs or gallops, no lower extremity edema.  Respiratory: Clear to auscultation bilaterally. Not using accessory muscles, speaking in full sentences. Right hip: ROM IR: 60 Deg, ER: 60 Deg, Flexion: 120 Deg, Extension: 100 Deg, Abduction: 45 Deg, Adduction: 45 Deg Strength IR: 5/5, ER: 5/5, Flexion: 5/5, Extension: 5/5, Abduction: 5/5, Adduction: 5/5 Pelvic alignment unremarkable to inspection and palpation. Standing hip rotation and gait without trendelenburg / unsteadiness. Greater trochanter without tenderness to palpation. No tenderness over piriformis. No SI joint tenderness and normal minimal SI movement.  Lumbar spine MRI from 2016 shows a broad-based L4-L5 disc protrusion, it does certainly cause some far lateral/extraforaminal stenosis of the right L4 nerve root.  Impression and Recommendations:    Lumbar degenerative disc disease Back in 2016 we did find a broad-based L4-L5 disc protrusion. This is likely worse now. Symptoms are consistent with L4 radiculitis, hip exam is normal. Physical therapy, prednisone. He is already taking Cymbalta and Lyrica. We will have to use an outside physical therapy facility. Return to see me in a month, MRI for interventional planning if no better, this would likely be a right L4 selective epidural.  History of sleeve gastrectomy Put on some pounds, would like to try phentermine for a couple of months. Return in a month for a weight check and refill.   ___________________________________________ Gwen Her. [name], M.D., ABFM., CAQSM. Primary Care and Sports Medicine   [facility]  Adjunct Professor of [place] [employer] [facility] of Medicine

## 2018-05-12 NOTE — Assessment & Plan Note (Signed)
Put on some pounds, would like to try phentermine for a couple of months. Return in a month for a weight check and refill.

## 2018-05-12 NOTE — Assessment & Plan Note (Signed)
Back in 2016 we did find a broad-based L4-L5 disc protrusion. This is likely worse now. Symptoms are consistent with L4 radiculitis, hip exam is normal. Physical therapy, prednisone. He is already taking Cymbalta and Lyrica. We will have to use an outside physical therapy facility. Return to see me in a month, MRI for interventional planning if no better, this would likely be a right L4 selective epidural.

## 2018-05-25 DIAGNOSIS — K219 Gastro-esophageal reflux disease without esophagitis: Secondary | ICD-10-CM | POA: Diagnosis not present

## 2018-05-25 DIAGNOSIS — R1013 Epigastric pain: Secondary | ICD-10-CM | POA: Diagnosis not present

## 2018-05-25 DIAGNOSIS — Z9884 Bariatric surgery status: Secondary | ICD-10-CM | POA: Diagnosis not present

## 2018-05-27 ENCOUNTER — Other Ambulatory Visit: Payer: Self-pay | Admitting: Sports Medicine

## 2018-05-27 DIAGNOSIS — G894 Chronic pain syndrome: Secondary | ICD-10-CM

## 2018-06-01 DIAGNOSIS — K228 Other specified diseases of esophagus: Secondary | ICD-10-CM | POA: Diagnosis not present

## 2018-06-01 DIAGNOSIS — K3189 Other diseases of stomach and duodenum: Secondary | ICD-10-CM | POA: Diagnosis not present

## 2018-06-01 DIAGNOSIS — K210 Gastro-esophageal reflux disease with esophagitis: Secondary | ICD-10-CM | POA: Diagnosis not present

## 2018-06-01 DIAGNOSIS — R1013 Epigastric pain: Secondary | ICD-10-CM | POA: Diagnosis not present

## 2018-06-05 ENCOUNTER — Other Ambulatory Visit: Payer: Self-pay | Admitting: Sports Medicine

## 2018-06-05 DIAGNOSIS — M5416 Radiculopathy, lumbar region: Secondary | ICD-10-CM

## 2018-06-09 ENCOUNTER — Ambulatory Visit (INDEPENDENT_AMBULATORY_CARE_PROVIDER_SITE_OTHER): Payer: 59

## 2018-06-09 ENCOUNTER — Other Ambulatory Visit: Payer: Self-pay

## 2018-06-09 ENCOUNTER — Ambulatory Visit: Payer: 59 | Admitting: Sports Medicine

## 2018-06-09 ENCOUNTER — Encounter: Payer: Self-pay | Admitting: Sports Medicine

## 2018-06-09 VITALS — BP 100/67 | HR 88 | Ht 62.0 in | Wt 163.0 lb

## 2018-06-09 DIAGNOSIS — Z903 Acquired absence of stomach [part of]: Secondary | ICD-10-CM | POA: Diagnosis not present

## 2018-06-09 DIAGNOSIS — E041 Nontoxic single thyroid nodule: Secondary | ICD-10-CM | POA: Diagnosis not present

## 2018-06-09 DIAGNOSIS — M5136 Other intervertebral disc degeneration, lumbar region: Secondary | ICD-10-CM

## 2018-06-09 DIAGNOSIS — R221 Localized swelling, mass and lump, neck: Secondary | ICD-10-CM

## 2018-06-09 DIAGNOSIS — E538 Deficiency of other specified B group vitamins: Secondary | ICD-10-CM

## 2018-06-09 DIAGNOSIS — E785 Hyperlipidemia, unspecified: Secondary | ICD-10-CM | POA: Diagnosis not present

## 2018-06-09 DIAGNOSIS — M51369 Other intervertebral disc degeneration, lumbar region without mention of lumbar back pain or lower extremity pain: Secondary | ICD-10-CM

## 2018-06-09 MED ORDER — CYANOCOBALAMIN 1000 MCG/ML IJ SOLN
1000.0000 ug | Freq: Once | INTRAMUSCULAR | Status: AC
  Administered 2018-06-09: 1000 ug via INTRAMUSCULAR

## 2018-06-09 NOTE — Assessment & Plan Note (Signed)
Persistent right thigh radicular symptoms. Tried Cymbalta, Lyrica, physical therapy for greater than 6 weeks. We are going to proceed with MRI in anticipation of a right L4 selective epidural. X-ray today, MRI maybe Sunday.

## 2018-06-09 NOTE — Assessment & Plan Note (Signed)
Gained a pound with phentermine, she did lose a great deal of weight with gastric sleeve. I have advised that we go ahead and stop the medications and she try Noom or weight watchers.

## 2018-06-09 NOTE — Progress Notes (Signed)
Subjective:    CC: Multiple issues  HPI: Neck mass: Present for several weeks now, nontender, left-sided and just anterior and distal to the angle of the mandible.  No upper respiratory symptoms, no constitutional symptoms.  Back pain: With persistent right thigh radicular symptoms, at this point has failed physical therapy, has failed multiple neuropathic agents.  Bowel or bladder dysfunction, saddle numbness, constitutional symptoms, has already done 6 weeks of conservative measures.  Obesity: Did not lose any weight with phentermine, she is agreeable to try either weight watchers or Noom.  I reviewed the past medical history, family history, social history, surgical history, and allergies today and no changes were needed.  Please see the problem list section below in epic for further details.  Past Medical History: Past Medical History:  Diagnosis Date   Gallstones    GERD (gastroesophageal reflux disease)    history of prior to gastric bypass   Hot flashes, menopausal    Medical history non-contributory    Migraine    OSA on CPAP    PONV (postoperative nausea and vomiting)    Rectocele 02/13/2018   Ventral hernia 2018   Small, noted on CT Abd/pel   Vitamin B 12 deficiency    Past Surgical History: Past Surgical History:  Procedure Laterality Date   ANTERIOR CERVICAL DECOMP/DISCECTOMY FUSION N/A 10/04/2013   Procedure: ANTERIOR CERVICAL DECOMPRESSION/DISCECTOMY FUSION 3 LEVELS;  Surgeon: Sinclair Ship, MD;  Location: [facility];  Service: Orthopedics;  Laterality: N/A;  Anterior cervical decompression fusion cervical 4-5, cervical 5-6, cervical 6-7 with instrumentation and allograft   ANTERIOR CERVICAL DECOMP/DISCECTOMY FUSION N/A 04/11/2014   Procedure: ANTERIOR CERVICAL DECOMPRESSION/DISCECTOMY FUSION 1 LEVEL;  Surgeon: Sinclair Ship, MD;  Location: [facility];  Service: Orthopedics;  Laterality: N/A;  Anterior cervical decompression fusion, cervical  7-thoracic 1 with instrumentation and allograft   CHOLECYSTECTOMY N/A 03/06/2018   Procedure: LAPAROSCOPIC CHOLECYSTECTOMY;  Surgeon: Greer Pickerel, MD;  Location: [facility];  Service: General;  Laterality: N/A;   COLONOSCOPY  2017   FACIAL COSMETIC SURGERY     FOOT SURGERY Bilateral    GASTRIC BYPASS     NECK EXPLORATION  2014, 2015   TUBAL LIGATION     UPPER GI ENDOSCOPY  2017   Social History: Social History   Socioeconomic History   Marital status: Married    Spouse name: Not on file   Number of children: 2   Years of education: 12   Highest education level: Not on file  Occupational History   Occupation: [employer]  Scientist, product/process development strain: Not on file   Food insecurity:    Worry: Not on file    Inability: Not on file   Transportation needs:    Medical: Not on file    Non-medical: Not on file  Tobacco Use   Smoking status: Light Tobacco Smoker    Packs/day: 0.25    Years: 2.00    Pack years: 0.50    Types: Cigarettes   Smokeless tobacco: Never Used  Substance and Sexual Activity   Alcohol use: No   Drug use: No   Sexual activity: Yes    Birth control/protection: Post-menopausal  Lifestyle   Physical activity:    Days per week: Not on file    Minutes per session: Not on file   Stress: Not on file  Relationships   Social connections:    Talks on phone: Not on file    Gets together: Not on file  Attends religious service: Not on file    Active member of club or organization: Not on file    Attends meetings of clubs or organizations: Not on file    Relationship status: Not on file  Other Topics Concern   Not on file  Social History Narrative   Lives with husband   Caffeine use: tea daily   Right handed    Family History: Family History  Problem Relation Age of Onset   Healthy Mother    Healthy Father    Colon cancer Sister    Allergies: Allergies  Allergen Reactions   Augmentin [Amoxicillin-Pot  Clavulanate] Diarrhea   Medications: See med rec.  Review of Systems: No fevers, chills, night sweats, weight loss, chest pain, or shortness of breath.   Objective:    General: Well Developed, well nourished, and in no acute distress.  Neuro: Alert and oriented x3, extra-ocular muscles intact, sensation grossly intact.  HEENT: Normocephalic, atraumatic, pupils equal round reactive to light, neck supple, there is a 1 to 2 cm mass under the left side of the neck, just distal and anterior to the angle of the mandible.  It is nontender to palpation, ill-defined. Skin: Warm and dry, no rashes. Cardiac: Regular rate and rhythm, no murmurs rubs or gallops, no lower extremity edema.  Respiratory: Clear to auscultation bilaterally. Not using accessory muscles, speaking in full sentences.  Impression and Recommendations:    History of sleeve gastrectomy Gained a pound with phentermine, she did lose a great deal of weight with gastric sleeve. I have advised that we go ahead and stop the medications and she try Noom or weight watchers.  Lumbar degenerative disc disease Persistent right thigh radicular symptoms. Tried Cymbalta, Lyrica, physical therapy for greater than 6 weeks. We are going to proceed with MRI in anticipation of a right L4 selective epidural. X-ray today, MRI maybe Sunday.  Hyperlipidemia Rechecking routine labs  Mass of left side of neck Suspect left cervical lymphadenopathy, the mass does feel somewhat large, I am going to go ahead and order an ultrasound to evaluate for worrisome features. It is nontender.  There is a nodule in the left thyroid, this needs a biopsy, I am going to place an order though it may be delayed   ___________________________________________ Gwen Her. [name], M.D., ABFM., CAQSM. Primary Care and Sports Medicine [facility]  Adjunct Professor of [employer] [employer]

## 2018-06-09 NOTE — Assessment & Plan Note (Signed)
Rechecking routine labs. 

## 2018-06-09 NOTE — Assessment & Plan Note (Addendum)
Suspect left cervical lymphadenopathy, the mass does feel somewhat large, I am going to go ahead and order an ultrasound to evaluate for worrisome features. It is nontender.  There is a nodule in the left thyroid, this needs a biopsy, I am going to place an order though it may be delayed

## 2018-06-12 DIAGNOSIS — E785 Hyperlipidemia, unspecified: Secondary | ICD-10-CM | POA: Diagnosis not present

## 2018-06-13 ENCOUNTER — Encounter: Payer: Self-pay | Admitting: Sports Medicine

## 2018-06-13 LAB — CBC
HCT: 38.2 % (ref 35.0–45.0)
Hemoglobin: 12.8 g/dL (ref 11.7–15.5)
MCH: 30.8 pg (ref 27.0–33.0)
MCHC: 33.5 g/dL (ref 32.0–36.0)
MCV: 91.8 fL (ref 80.0–100.0)
MPV: 10.8 fL (ref 7.5–12.5)
Platelets: 240 10*3/uL (ref 140–400)
RBC: 4.16 10*6/uL (ref 3.80–5.10)
RDW: 13 % (ref 11.0–15.0)
WBC: 6.2 10*3/uL (ref 3.8–10.8)

## 2018-06-13 LAB — COMPLETE METABOLIC PANEL WITH GFR
AG Ratio: 1.9 (calc) (ref 1.0–2.5)
ALT: 15 U/L (ref 6–29)
AST: 15 U/L (ref 10–35)
Albumin: 3.7 g/dL (ref 3.6–5.1)
Alkaline phosphatase (APISO): 98 U/L (ref 37–153)
BUN: 14 mg/dL (ref 7–25)
CO2: 26 mmol/L (ref 20–32)
Calcium: 8.4 mg/dL — ABNORMAL LOW (ref 8.6–10.4)
Chloride: 111 mmol/L — ABNORMAL HIGH (ref 98–110)
Creat: 0.65 mg/dL (ref 0.50–1.05)
GFR, Est African American: 113 mL/min/{1.73_m2} (ref >=60)
GFR, Est Non African American: 97 mL/min/{1.73_m2} (ref >=60)
Globulin: 1.9 g/dL (calc) (ref 1.9–3.7)
Glucose, Bld: 79 mg/dL (ref 65–99)
Potassium: 4.1 mmol/L (ref 3.5–5.3)
Sodium: 144 mmol/L (ref 135–146)
Total Bilirubin: 0.4 mg/dL (ref 0.2–1.2)
Total Protein: 5.6 g/dL — ABNORMAL LOW (ref 6.1–8.1)

## 2018-06-13 LAB — HEMOGLOBIN A1C
Hgb A1c MFr Bld: 4.8 % of total Hgb (ref <5.7)
Mean Plasma Glucose: 91 (calc)
eAG (mmol/L): 5 (calc)

## 2018-06-13 LAB — LIPID PANEL W/REFLEX DIRECT LDL
Cholesterol: 172 mg/dL (ref <200)
HDL: 74 mg/dL (ref >=50)
LDL Cholesterol (Calc): 84 mg/dL (calc)
Non-HDL Cholesterol (Calc): 98 mg/dL (calc) (ref <130)
Total CHOL/HDL Ratio: 2.3 (calc) (ref <5.0)
Triglycerides: 49 mg/dL (ref <150)

## 2018-06-13 LAB — TSH: TSH: 1.2 mIU/L (ref 0.40–4.50)

## 2018-06-15 ENCOUNTER — Ambulatory Visit: Payer: Self-pay | Admitting: Sports Medicine

## 2018-06-16 ENCOUNTER — Other Ambulatory Visit: Payer: Self-pay

## 2018-06-16 ENCOUNTER — Encounter (INDEPENDENT_AMBULATORY_CARE_PROVIDER_SITE_OTHER): Payer: 59 | Admitting: Sports Medicine

## 2018-06-16 ENCOUNTER — Emergency Department
Admission: EM | Admit: 2018-06-16 | Discharge: 2018-06-16 | Disposition: A | Discharge status: Home / Self Care | Payer: 59 | Source: Home / Self Care | Attending: Emergency Medicine | Admitting: Emergency Medicine

## 2018-06-16 ENCOUNTER — Emergency Department (INDEPENDENT_AMBULATORY_CARE_PROVIDER_SITE_OTHER): Payer: 59

## 2018-06-16 DIAGNOSIS — R918 Other nonspecific abnormal finding of lung field: Secondary | ICD-10-CM | POA: Diagnosis not present

## 2018-06-16 DIAGNOSIS — R9389 Abnormal findings on diagnostic imaging of other specified body structures: Secondary | ICD-10-CM | POA: Diagnosis not present

## 2018-06-16 DIAGNOSIS — R079 Chest pain, unspecified: Secondary | ICD-10-CM | POA: Diagnosis not present

## 2018-06-16 DIAGNOSIS — R072 Precordial pain: Secondary | ICD-10-CM

## 2018-06-16 DIAGNOSIS — K219 Gastro-esophageal reflux disease without esophagitis: Secondary | ICD-10-CM

## 2018-06-16 MED ORDER — NITROGLYCERIN 0.4 MG SL SUBL: 0.4000 mg | SUBLINGUAL_TABLET | SUBLINGUAL | 0 refills | Status: DC | PRN

## 2018-06-16 NOTE — ED Provider Notes (Addendum)
[facility]    CSN: [id number] Arrival date & time: 06/16/18  0845     History   Chief Complaint Chief Complaint  Patient presents with   Chest Pain    HPI Sara Wolfe is a 58 y.o. female.  Patient enters with a chief complaint of chest pain.  2 nights ago around 7:00 she had an episode of chest pain.  This was a pressure sensation which lasted approximately 20 minutes and then resolved.  This was not associated with any shortness of breath diaphoresis nausea or radiation to the neck or shoulders.  She did not have any pain when she laid down last night but then this morning approximately 230 she woke up with her CPAP off and was experiencing a pressure-like sensation with some discomfort around the left arm again lasting 20 minutes this was not associated with shortness of breath, nausea, or diaphoresis.  Of note she recently had an endoscopy.  She was started on Protonix and Carafate but has not been taking that medication regularly but did take it this morning.  She is a smoker 5 cigarettes a day.  Of note she also has a nodule in the left thyroid with plans to have a biopsy at some time in the future. HPI  Past Medical History:  Diagnosis Date   Gallstones    GERD (gastroesophageal reflux disease)    history of prior to gastric bypass   Hot flashes, menopausal    Medical history non-contributory    Migraine    OSA on CPAP    PONV (postoperative nausea and vomiting)    Rectocele 02/13/2018   Ventral hernia 2018   Small, noted on CT Abd/pel   Vitamin B 12 deficiency     Patient Active Problem List   Diagnosis Date Noted   Mass of left side of neck 06/09/2018   Rectocele 01/11/2018   Decreased libido 01/11/2018   Eyelash scantiness 08/08/2017   History of facelift 06/13/2017   Memory change 02/07/2017   Dysarthria 01/17/2017   Vitamin B 12 deficiency 12/17/2016   Chronic pain syndrome 10/16/2015   Hyperlipidemia 05/20/2015   Migraine  headache 05/16/2015   Obstructive sleep apnea 05/16/2015   Lateral epicondylitis of left elbow 05/09/2015   Lumbar degenerative disc disease 04/11/2014   Annual physical exam 02/21/2014   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   S/P cervical spinal fusion 10/04/2013   Hot flash, menopausal 12/27/2011    Past Surgical History:  Procedure Laterality Date   ANTERIOR CERVICAL DECOMP/DISCECTOMY FUSION N/A 10/04/2013   Procedure: ANTERIOR CERVICAL DECOMPRESSION/DISCECTOMY FUSION 3 LEVELS;  Surgeon: Sinclair Ship, MD;  Location: [facility];  Service: Orthopedics;  Laterality: N/A;  Anterior cervical decompression fusion cervical 4-5, cervical 5-6, cervical 6-7 with instrumentation and allograft   ANTERIOR CERVICAL DECOMP/DISCECTOMY FUSION N/A 04/11/2014   Procedure: ANTERIOR CERVICAL DECOMPRESSION/DISCECTOMY FUSION 1 LEVEL;  Surgeon: Sinclair Ship, MD;  Location: [facility];  Service: Orthopedics;  Laterality: N/A;  Anterior cervical decompression fusion, cervical 7-thoracic 1 with instrumentation and allograft   CHOLECYSTECTOMY N/A 03/06/2018   Procedure: LAPAROSCOPIC CHOLECYSTECTOMY;  Surgeon: Greer Pickerel, MD;  Location: [facility];  Service: General;  Laterality: N/A;   COLONOSCOPY  2017   FACIAL COSMETIC SURGERY     FOOT SURGERY Bilateral    GASTRIC BYPASS     NECK EXPLORATION  2014, 2015   TUBAL LIGATION     UPPER GI ENDOSCOPY  2017    OB History    Gravida  2   Para  2   Term  2   Preterm      AB      Living  2     SAB      TAB      Ectopic      Multiple      Live Births               Home Medications    Prior to Admission medications   Medication Sig Start Date End Date Taking? Authorizing Provider  AMBULATORY NON FORMULARY MEDICATION Continuous positive airway pressure (CPAP) machine autopap 5-21 cm of H2O pressure (or autoPAP if available), with all supplemental supplies as needed.  To [facility]. AHI=15 12/16/16   Silverio Decamp,  MD  amitriptyline (ELAVIL) 50 MG tablet TAKE 1/2 TABLET BY MOUTH AT BEDTIME FOR 7 DAYS THEN TAKE 1 TABLET AT BEDTIME 05/27/18   Silverio Decamp, MD  bimatoprost (LATISSE) 0.03 % ophthalmic solution Place 1gtt on applicator and apply evenly along the skin of the upper eyelid at base of eyelashes qhs; use new applicator for 2nd eye Patient taking differently: Place 1 drop into both eyes at bedtime. Place 1gtt on applicator and apply evenly along the skin of the upper eyelid at base of eyelashes qhs; use new applicator for 2nd eye 08/17/92   Silverio Decamp, MD  cyanocobalamin (,VITAMIN B-12,) 1000 MCG/ML injection Inject 1,000 mcg into the muscle every 30 (thirty) days. Inject 1000 mcg weekly intramuscular for 1 month then monthly thereafter 12/17/16   Silverio Decamp, MD  DULoxetine (CYMBALTA) 60 MG capsule Take 1 capsule (60 mg total) by mouth daily. 05/10/18   Silverio Decamp, MD  Estradiol 10 MCG TABS vaginal tablet 1 tablet per vagina 2 nights per week. Patient taking differently: Place 10 mcg vaginally 2 (two) times a week. 1 tablet per vagina 2 nights per week. 02/13/18   Dove, Wilhemina Cash, MD  Flibanserin (ADDYI) 100 MG TABS Take 1 tablet by mouth at bedtime. Patient taking differently: Take 100 mg by mouth at bedtime.  02/13/18   Emily Filbert, MD  folic acid (FOLVITE) 1 MG tablet TAKE 5 TABLETS (5 MG TOTAL) BY MOUTH DAILY. 02/21/17   Silverio Decamp, MD  methocarbamol (ROBAXIN) 500 MG tablet TAKE 1 TABLET BY MOUTH THREE TIMES A DAY 04/02/18   Silverio Decamp, MD  nitroGLYCERIN (NITROSTAT) 0.4 MG SL tablet Place 1 tablet (0.4 mg total) under the tongue every 5 (five) minutes as needed for chest pain. 06/16/18   Darlyne Russian, MD  phentermine (ADIPEX-P) 37.5 MG tablet One tab by mouth qAM 05/12/18   Silverio Decamp, MD  pregabalin (LYRICA) 200 MG capsule TAKE 1 CAPSULE BY MOUTH THREE TIMES A DAY 04/17/18   Silverio Decamp, MD  rizatriptan (MAXALT-MLT) 10  MG disintegrating tablet TAKE 1 TABLET (10 MG TOTAL) BY MOUTH AS NEEDED FOR MIGRAINE. MAY REPEAT IN 2 HOURS IF NEEDED 02/21/16   Silverio Decamp, MD  Topiramate ER (TROKENDI XR) 50 MG CP24 Take 1 capsule by mouth daily. 03/28/18   Silverio Decamp, MD  traMADol Veatrice Bourbon) 50 MG tablet TAKE 1 TABLET BY MOUTH EVERY 8 HOURS AS NEEDED FOR PAIN 06/05/18   Silverio Decamp, MD    Family History Family History  Problem Relation Age of Onset   Healthy Mother    Healthy Father    Colon cancer Sister     Social History Social History  Tobacco Use   Smoking status: Light Tobacco Smoker    Packs/day: 0.25    Years: 2.00    Pack years: 0.50    Types: Cigarettes   Smokeless tobacco: Never Used  Substance Use Topics   Alcohol use: No   Drug use: No     Allergies   Augmentin [amoxicillin-pot clavulanate]   Review of Systems Review of Systems  Constitutional: Negative.   HENT: Negative.   Respiratory: Positive for chest tightness. Negative for cough, shortness of breath, wheezing and stridor.   Cardiovascular: Positive for chest pain. Negative for palpitations and leg swelling.  Gastrointestinal:       Patient does have a history of reflux and has been on Protonix and Carafate but taking very irregularly.  She recently had an endoscopy.     Physical Exam Triage Vital Signs ED Triage Vitals  Enc Vitals Group     BP 06/16/18 0907 128/85     Pulse Rate 06/16/18 0907 66     Resp 06/16/18 0907 20     Temp 06/16/18 0907 98.2 F (36.8 C)     Temp Source 06/16/18 0907 Oral     SpO2 06/16/18 0907 98 %     Weight 06/16/18 0908 164 lb (74.4 kg)     Height 06/16/18 0908 5\' 2"  (1.575 m)     Head Circumference --      Peak Flow --      Pain Score 06/16/18 0908 0     Pain Loc --      Pain Edu? --      Excl. in [place]? --    No data found.  Updated Vital Signs BP 128/85 (BP Location: Right Arm)    Pulse 66    Temp 98.2 F (36.8 C) (Oral)    Resp 20    Ht 5\' 2"   (1.575 m)    Wt 74.4 kg    SpO2 98%    BMI 30.00 kg/m   Visual Acuity Right Eye Distance:   Left Eye Distance:   Bilateral Distance:    Right Eye Near:   Left Eye Near:    Bilateral Near:     Physical Exam Constitutional:      Appearance: She is well-developed.  HENT:     Head: Normocephalic.  Neck:     Comments: There is a 1 x 1 cm nodule left side of the thyroid Cardiovascular:     Rate and Rhythm: Normal rate and regular rhythm.     Heart sounds: Normal heart sounds.  Pulmonary:     Effort: Pulmonary effort is normal.     Breath sounds: Normal breath sounds. No wheezing.  Chest:     Chest wall: No deformity or tenderness.  Abdominal:     Palpations: Abdomen is soft.     Comments: Healed incisions from laparoscopy.  Neurological:     Mental Status: She is alert.      UC Treatments / Results  Labs (all labs ordered are listed, but only abnormal results are displayed) Labs Reviewed - No data to display  EKG Normal sinus rhythm rate 62 left axis deviation unchanged from EKG 2015 Radiology Dg Chest 2 View  Result Date: 06/16/2018 CLINICAL DATA:  Chest pain. EXAM: CHEST - 2 VIEW COMPARISON:  06/17/2017 and 01/07/2016. FINDINGS: Both lungs are clear. No pleural effusions. Negative for a pneumothorax. Surgical hardware in the lower cervical spine. Vague nodular density in the anterior lower chest on the lateral view may represent  overlying shadows. There may have been vague densities in the anterior lower chest in 2017 but it is more conspicuous on this examination. IMPRESSION: No active cardiopulmonary disease. Vague nodular density in the anterior lower chest may represent overlying shadows but nonspecific. Consider a 1 month follow-up two view chest radiography to evaluate stability of this finding. Electronically Signed   By: Markus Daft M.D.   On: 06/16/2018 09:54    Procedures Procedures (including critical care time)  Medications Ordered in UC Medications - No  data to display  Initial Impression / Assessment and Plan / UC Course  I have reviewed the triage vital signs and the nursing notes.  Pertinent labs & imaging results that were available during my care of the patient were reviewed by me and considered in my medical decision making (see chart for details). Patient here with chest pain.  She is a smoker age 79 and certainly raises the possibility of coronary disease.  Pulse ox is normal at 98 and no signs of DVT.  EKG is unchanged from previous.  Chest x-ray does show an abnormal area anterior lower chest and repeat x-ray recommended to be done in 1 month.  We will discuss this with the patient and see if she can follow this up with Dr. Lewanda Rife.  She is already scheduled for follow-up of a known left thyroid nodule.  She was advised she was not to take any phentermine.  She was advised to restart her Carafate and Protonix pending reevaluation by Dr. Lewanda Rife.  She was encouraged to use her CPAP machine.  I went [place] and talk to Dr. Lewanda Rife and advised him that I had made a referral for cardiology but that their office would have to be the one to actually make the referral.  He said he would follow through with this.  Dr. Lewanda Rife was also given a copy of the chest x-ray that will need follow-up as an outpatient in 1 month.     Final Clinical Impressions(s) / UC Diagnoses   Final diagnoses:  Precordial pain  Gastroesophageal reflux disease, esophagitis presence not specified  Abnormal chest x-ray     Discharge Instructions     Please discuss with Dr. Lewanda Rife the possibility of a cardiac referral.  I have put in the referral for this but he would have to make the appointment You will need a repeat chest x-ray in 1 month. Please use your CPAP every night. Please take your Protonix and Carafate you have. Do not take your phentermine. If you have an episode lasting greater than an hour and does not relieve with the  medications you are currently on please go to the emergency room or call EMS.  You have nitroglycerin to take in an emergency situation with chest pain.    ED Prescriptions    Medication Sig Dispense Auth. Provider   nitroGLYCERIN (NITROSTAT) 0.4 MG SL tablet Place 1 tablet (0.4 mg total) under the tongue every 5 (five) minutes as needed for chest pain. 30 tablet Darlyne Russian, MD     Controlled Substance Prescriptions [place] Controlled Substance Registry consulted? Not Applicable   Darlyne Russian, MD 06/16/18 1203    Darlyne Russian, MD 06/16/18 [phone]

## 2018-06-16 NOTE — Discharge Instructions (Addendum)
Please discuss with Dr. Lewanda Rife the possibility of a cardiac referral.  I have put in the referral for this but he would have to make the appointment You will need a repeat chest x-ray in 1 month. Please use your CPAP every night. Please take your Protonix and Carafate you have. Do not take your phentermine. If you have an episode lasting greater than an hour and does not relieve with the medications you are currently on please go to the emergency room or call EMS.  You have nitroglycerin to take in an emergency situation with chest pain.

## 2018-06-16 NOTE — ED Triage Notes (Signed)
Pt states that the last 2 night she has been awaken by chest pain.  It is mid sternum area, and pain level 6.  She had teeth sensitivity yesterday , and a dry cough.  Pt wears a CPAP at night.

## 2018-06-16 NOTE — Assessment & Plan Note (Addendum)
Noncardiac chest pain with nodular density in the anterior chest x-ray, we will repeat imaging in a month to ensure that this resolves.

## 2018-06-16 NOTE — Telephone Encounter (Signed)
I spent 5 total minutes of online digital evaluation and management services. 

## 2018-06-24 ENCOUNTER — Emergency Department (INDEPENDENT_AMBULATORY_CARE_PROVIDER_SITE_OTHER): Payer: 59

## 2018-06-24 ENCOUNTER — Other Ambulatory Visit: Payer: Self-pay

## 2018-06-24 ENCOUNTER — Emergency Department
Admission: EM | Admit: 2018-06-24 | Discharge: 2018-06-24 | Disposition: A | Discharge status: Home / Self Care | Payer: 59 | Source: Home / Self Care

## 2018-06-24 DIAGNOSIS — R0789 Other chest pain: Secondary | ICD-10-CM

## 2018-06-24 DIAGNOSIS — R109 Unspecified abdominal pain: Secondary | ICD-10-CM

## 2018-06-24 DIAGNOSIS — R079 Chest pain, unspecified: Secondary | ICD-10-CM | POA: Diagnosis not present

## 2018-06-24 DIAGNOSIS — R0781 Pleurodynia: Secondary | ICD-10-CM

## 2018-06-24 NOTE — ED Provider Notes (Signed)
[name]    CSN: [id number] Arrival date & time: 06/24/18  1328     History   Chief Complaint Chief Complaint  Patient presents with  . Chest Pain    HPI Sara Wolfe is a 58 y.o. female.   HPI  Sara Wolfe is a 58 y.o. female presenting to UC with c/o Right side lower chest/rib pain that started last night but worse this morning. Pain is sharp and stabbing, 3/10 at rest, worse with movement.  Denies known injury but she does lift 30lb boxes at work. She has been working 7 days a week recently but has lifted the same boxes for years w/o issue.  She takes Robaxin and Tramadol daily as prescribed for other pain but it has not been helping current pain.  Denies rashes or bruising to the area of pain. No SOB. No recent cough. She had her gallbladder removed a few months ago.  Denies urinary symptoms. Denies hx of kidney stones.   Past Medical History:  Diagnosis Date  . Gallstones   . GERD (gastroesophageal reflux disease)    history of prior to gastric bypass  . Hot flashes, menopausal   . Medical history non-contributory   . Migraine   . OSA on CPAP   . PONV (postoperative nausea and vomiting)   . Rectocele 02/13/2018  . Ventral hernia 2018   Small, noted on CT Abd/pel  . Vitamin B 12 deficiency     Patient Active Problem List   Diagnosis Date Noted  . Abnormal chest x-ray 06/16/2018  . Mass of left side of neck 06/09/2018  . Rectocele 01/11/2018  . Decreased libido 01/11/2018  . Eyelash scantiness 08/08/2017  . History of facelift 06/13/2017  . Memory change 02/07/2017  . Dysarthria 01/17/2017  . Vitamin B 12 deficiency 12/17/2016  . Chronic pain syndrome 10/16/2015  . Hyperlipidemia 05/20/2015  . Migraine headache 05/16/2015  . Obstructive sleep apnea 05/16/2015  . Lateral epicondylitis of left elbow 05/09/2015  . Lumbar degenerative disc disease 04/11/2014  . Annual physical exam 02/21/2014  . 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 S/P  cervical spinal fusion 10/04/2013  . Hot flash, menopausal 12/27/2011    Past Surgical History:  Procedure Laterality Date  . ANTERIOR CERVICAL DECOMP/DISCECTOMY FUSION N/A 10/04/2013   Procedure: ANTERIOR CERVICAL DECOMPRESSION/DISCECTOMY FUSION 3 LEVELS;  Surgeon: Sinclair Ship, MD;  Location: [place];  Service: Orthopedics;  Laterality: N/A;  Anterior cervical decompression fusion cervical 4-5, cervical 5-6, cervical 6-7 with instrumentation and allograft  . ANTERIOR CERVICAL DECOMP/DISCECTOMY FUSION N/A 04/11/2014   Procedure: ANTERIOR CERVICAL DECOMPRESSION/DISCECTOMY FUSION 1 LEVEL;  Surgeon: Sinclair Ship, MD;  Location: [place];  Service: Orthopedics;  Laterality: N/A;  Anterior cervical decompression fusion, cervical 7-thoracic 1 with instrumentation and allograft  . CHOLECYSTECTOMY N/A 03/06/2018   Procedure: LAPAROSCOPIC CHOLECYSTECTOMY;  Surgeon: Greer Pickerel, MD;  Location[facility];  Service: General;  Laterality: N/A;  . COLONOSCOPY  2017  . FACIAL COSMETIC SURGERY    . FOOT SURGERY Bilateral   . GASTRIC BYPASS    . NECK EXPLORATION  2014, 2015  . TUBAL LIGATION    . UPPER GI ENDOSCOPY  2017    OB History    Gravida  2   Para  2   Term  2   Preterm      AB      Living  2     SAB      TAB  Ectopic      Multiple      Live Births               Home Medications    Prior to Admission medications   Medication Sig Start Date End Date Taking? Authorizing Provider  AMBULATORY NON FORMULARY MEDICATION Continuous positive airway pressure (CPAP) machine autopap 5-21 cm of H2O pressure (or autoPAP if available), with all supplemental supplies as needed.  To [employer]. AHI=15 12/16/16   Silverio Decamp, MD  amitriptyline (ELAVIL) 50 MG tablet TAKE 1/2 TABLET BY MOUTH AT BEDTIME FOR 7 DAYS THEN TAKE 1 TABLET AT BEDTIME 05/27/18   Silverio Decamp, MD  bimatoprost (LATISSE) 0.03 % ophthalmic solution Place 1gtt on applicator and apply evenly  along the skin of the upper eyelid at base of eyelashes qhs; use new applicator for 2nd eye Patient taking differently: Place 1 drop into both eyes at bedtime. Place 1gtt on applicator and apply evenly along the skin of the upper eyelid at base of eyelashes qhs; use new applicator for 2nd eye 1/85/63   Silverio Decamp, MD  cyanocobalamin (,VITAMIN B-12,) 1000 MCG/ML injection Inject 1,000 mcg into the muscle every 30 (thirty) days. Inject 1000 mcg weekly intramuscular for 1 month then monthly thereafter 12/17/16   Silverio Decamp, MD  DULoxetine (CYMBALTA) 60 MG capsule Take 1 capsule (60 mg total) by mouth daily. 05/10/18   Silverio Decamp, MD  Estradiol 10 MCG TABS vaginal tablet 1 tablet per vagina 2 nights per week. Patient taking differently: Place 10 mcg vaginally 2 (two) times a week. 1 tablet per vagina 2 nights per week. 02/13/18   Dove, Wilhemina Cash, MD  Flibanserin (ADDYI) 100 MG TABS Take 1 tablet by mouth at bedtime. Patient taking differently: Take 100 mg by mouth at bedtime.  02/13/18   Emily Filbert, MD  folic acid (FOLVITE) 1 MG tablet TAKE 5 TABLETS (5 MG TOTAL) BY MOUTH DAILY. 02/21/17   Silverio Decamp, MD  methocarbamol (ROBAXIN) 500 MG tablet TAKE 1 TABLET BY MOUTH THREE TIMES A DAY 04/02/18   Silverio Decamp, MD  nitroGLYCERIN (NITROSTAT) 0.4 MG SL tablet Place 1 tablet (0.4 mg total) under the tongue every 5 (five) minutes as needed for chest pain. 06/16/18   Darlyne Russian, MD  phentermine (ADIPEX-P) 37.5 MG tablet One tab by mouth qAM 05/12/18   Silverio Decamp, MD  pregabalin (LYRICA) 200 MG capsule TAKE 1 CAPSULE BY MOUTH THREE TIMES A DAY 04/17/18   Silverio Decamp, MD  rizatriptan (MAXALT-MLT) 10 MG disintegrating tablet TAKE 1 TABLET (10 MG TOTAL) BY MOUTH AS NEEDED FOR MIGRAINE. MAY REPEAT IN 2 HOURS IF NEEDED 02/21/16   Silverio Decamp, MD  Topiramate ER (TROKENDI XR) 50 MG CP24 Take 1 capsule by mouth daily. 03/28/18    Silverio Decamp, MD  traMADol (ULTRAM) 50 MG tablet TAKE 1 TABLET BY MOUTH EVERY 8 HOURS AS NEEDED FOR PAIN 06/05/18   Silverio Decamp, MD    Family History Family History  Problem Relation Age of Onset  . Healthy Mother   . Healthy Father   . Colon cancer Sister     Social History Social History   Tobacco Use  . Smoking status: Light Tobacco Smoker    Packs/day: 0.25    Years: 2.00    Pack years: 0.50    Types: Cigarettes  . Smokeless tobacco: Never Used  Substance Use Topics  . Alcohol use:  No  . Drug use: No     Allergies   Augmentin [amoxicillin-pot clavulanate]   Review of Systems Review of Systems  Constitutional: Negative for chills and fever.  HENT: Negative for congestion.   Respiratory: Negative for cough, chest tightness and shortness of breath.   Cardiovascular: Positive for chest pain (Right lower/ribs). Negative for palpitations.  Gastrointestinal: Negative for abdominal pain, diarrhea, nausea and vomiting.  Genitourinary: Positive for flank pain (Right). Negative for dysuria, frequency and hematuria.  Musculoskeletal: Negative for back pain.     Physical Exam Triage Vital Signs ED Triage Vitals [06/24/18 1413]  Enc Vitals Group     BP 123/79     Pulse Rate 68     Resp 20     Temp 98.4 F (36.9 C)     Temp Source Oral     SpO2 98 %     Weight 167 lb (75.8 kg)     Height 5\' 3"  (1.6 m)     Head Circumference      Peak Flow      Pain Score 3     Pain Loc      Pain Edu?      Excl. in [place]?    No data found.  Updated Vital Signs BP 123/79 (BP Location: Right Arm)   Pulse 68   Temp 98.4 F (36.9 C) (Oral)   Resp 20   Ht 5\' 3"  (1.6 m)   Wt 167 lb (75.8 kg)   SpO2 98%   BMI 29.58 kg/m   Visual Acuity Right Eye Distance:   Left Eye Distance:   Bilateral Distance:    Right Eye Near:   Left Eye Near:    Bilateral Near:     Physical Exam Vitals signs and nursing note reviewed.  Constitutional:      Appearance: She  is well-developed.  HENT:     Head: Normocephalic and atraumatic.  Neck:     Musculoskeletal: Normal range of motion.  Cardiovascular:     Rate and Rhythm: Normal rate and regular rhythm.  Pulmonary:     Effort: Pulmonary effort is normal.     Breath sounds: No decreased breath sounds.  Chest:    Musculoskeletal: Normal range of motion.  Skin:    General: Skin is warm and dry.     Findings: No ecchymosis or rash.  Neurological:     Mental Status: She is alert and oriented to person, place, and time.  Psychiatric:        Behavior: Behavior normal.      UC Treatments / Results  Labs (all labs ordered are listed, but only abnormal results are displayed) Labs Reviewed - No data to display  EKG None  Radiology Dg Chest 2 View  Result Date: 06/24/2018 CLINICAL DATA:  58 year-old female c/o pain under her RIGHT breast since yesterday. Pt denies specific injury but states she lifts heavy equipment at work. Smoker X < 1ppd EXAM: CHEST - 2 VIEW COMPARISON:  06/16/2018 FINDINGS: Lungs are clear. Heart size and mediastinal contours are within normal limits. No effusion.  No pneumothorax. Cervical fixation hardware partially visualized. Cholecystectomy clips. IMPRESSION: No acute cardiopulmonary disease. Electronically Signed   By: Lucrezia Europe M.D.   On: 06/24/2018 14:36    Procedures Procedures (including critical care time)  Medications Ordered in UC Medications - No data to display  Initial Impression / Assessment and Plan / UC Course  I have reviewed the triage vital signs and the  nursing notes.  Pertinent labs & imaging results that were available during my care of the patient were reviewed by me and considered in my medical decision making (see chart for details).     Hx and exam c/w musculoskeletal pain Encouraged to continue taking her prescribed pain medication F/u with PCP  Final Clinical Impressions(s) / UC Diagnoses   Final diagnoses:  Right flank pain  Rib  pain on right side     Discharge Instructions      You may continue to take your muscle relaxer and pain medication as previously prescribed.  You may also try alternating cool and warm compresses as well as over the counter muscle rub such as Icyhot to help with the soreness.  Please call to schedule a follow up appointment with your primary care provider for ongoing healthcare needs and recheck of symptoms in 1 week if not improving.    ED Prescriptions    None     Controlled Substance Prescriptions [place] Controlled Substance Registry consulted? Not Applicable   Tyrell Antonio 06/24/18 1542

## 2018-06-24 NOTE — Discharge Instructions (Signed)
°  You may continue to take your muscle relaxer and pain medication as previously prescribed.  You may also try alternating cool and warm compresses as well as over the counter muscle rub such as Icyhot to help with the soreness.  Please call to schedule a follow up appointment with your primary care provider for ongoing healthcare needs and recheck of symptoms in 1 week if not improving.

## 2018-06-24 NOTE — ED Triage Notes (Signed)
Pt states that last night and today has had right sided chest/rib pain whenever moving her right arm.  3 whenever not moving, 8 when moving. Took ibuprofen at 9 am

## 2018-06-25 ENCOUNTER — Other Ambulatory Visit: Payer: Self-pay | Admitting: Sports Medicine

## 2018-06-27 ENCOUNTER — Encounter: Payer: Self-pay | Admitting: Sports Medicine

## 2018-06-27 ENCOUNTER — Ambulatory Visit: Payer: 59 | Admitting: Sports Medicine

## 2018-06-27 VITALS — BP 123/77 | HR 70 | Ht 63.0 in | Wt 168.0 lb

## 2018-06-27 DIAGNOSIS — M940 Chondrocostal junction syndrome [Tietze]: Secondary | ICD-10-CM | POA: Diagnosis not present

## 2018-06-27 DIAGNOSIS — E041 Nontoxic single thyroid nodule: Secondary | ICD-10-CM

## 2018-06-27 DIAGNOSIS — Z9049 Acquired absence of other specified parts of digestive tract: Secondary | ICD-10-CM | POA: Diagnosis not present

## 2018-06-27 DIAGNOSIS — E538 Deficiency of other specified B group vitamins: Secondary | ICD-10-CM | POA: Diagnosis not present

## 2018-06-27 DIAGNOSIS — R222 Localized swelling, mass and lump, trunk: Secondary | ICD-10-CM

## 2018-06-27 DIAGNOSIS — R0789 Other chest pain: Secondary | ICD-10-CM

## 2018-06-27 DIAGNOSIS — M5416 Radiculopathy, lumbar region: Secondary | ICD-10-CM

## 2018-06-27 DIAGNOSIS — R2230 Localized swelling, mass and lump, unspecified upper limb: Secondary | ICD-10-CM

## 2018-06-27 DIAGNOSIS — M51369 Other intervertebral disc degeneration, lumbar region without mention of lumbar back pain or lower extremity pain: Secondary | ICD-10-CM

## 2018-06-27 DIAGNOSIS — K2970 Gastritis, unspecified, without bleeding: Secondary | ICD-10-CM

## 2018-06-27 DIAGNOSIS — M5136 Other intervertebral disc degeneration, lumbar region: Secondary | ICD-10-CM

## 2018-06-27 MED ORDER — CYANOCOBALAMIN 1000 MCG/ML IJ SOLN
1000.0000 ug | Freq: Once | INTRAMUSCULAR | Status: AC
  Administered 2018-06-27: 1000 ug via INTRAMUSCULAR

## 2018-06-27 MED ORDER — TRAMADOL HCL 50 MG PO TABS: 50.0000 mg | ORAL_TABLET | Freq: Three times a day (TID) | ORAL | 0 refills | Status: DC | PRN

## 2018-06-27 NOTE — Assessment & Plan Note (Signed)
Right thigh radicular symptoms. Cymbalta, Lyrica, physical therapy have already been tried, awaiting MRI in anticipation of a right selective L4 epidural.

## 2018-06-27 NOTE — Assessment & Plan Note (Signed)
Pain of biliary colic is now resolved.

## 2018-06-27 NOTE — Assessment & Plan Note (Signed)
Questionable density in the chest, anterior, only seen on one image. I do think we need to go and proceed with a CT of the chest with IV contrast.

## 2018-06-27 NOTE — Assessment & Plan Note (Signed)
Did well with pantoprazole and Carafate.

## 2018-06-27 NOTE — Progress Notes (Addendum)
Subjective:    CC: Multiple issues  HPI: Chest wall pain: Right-sided, her employment requires a great deal of heavy lifting.  She does have a history of biliary colic, post cholecystectomy and doing well now, this pain feels different.  She also has gastritis noted on EGD recently, resolved with PPIs, her pain continues to feel different than these other processes.  No other GI symptoms.  Worse with activity, better with splinting her right sided rib cage.  Chest mass: Noted incidentally on a chest x-ray, repeat x-rays still showed a hint of the mass.  She is a smoker, 5 sticks per day, sometimes more.  Lumbar radiculopathy: Right-sided, failed conservative measures, has not yet had her MRI, the plan was for right L4-L5 transforaminal epidural.  I reviewed the past medical history, family history, social history, surgical history, and allergies today and no changes were needed.  Please see the problem list section below in epic for further details.  Past Medical History: Past Medical History:  Diagnosis Date  . Gallstones   . GERD (gastroesophageal reflux disease)    history of prior to gastric bypass  . Hot flashes, menopausal   . Medical history non-contributory   . Migraine   . OSA on CPAP   . PONV (postoperative nausea and vomiting)   . Rectocele 02/13/2018  . Ventral hernia 2018   Small, noted on CT Abd/pel  . Vitamin B 12 deficiency    Past Surgical History: Past Surgical History:  Procedure Laterality Date  . ANTERIOR CERVICAL DECOMP/DISCECTOMY FUSION N/A 10/04/2013   Procedure: ANTERIOR CERVICAL DECOMPRESSION/DISCECTOMY FUSION 3 LEVELS;  Surgeon: Sinclair Ship, MD;  Location: [place];  Service: Orthopedics;  Laterality: N/A;  Anterior cervical decompression fusion cervical 4-5, cervical 5-6, cervical 6-7 with instrumentation and allograft  . ANTERIOR CERVICAL DECOMP/DISCECTOMY FUSION N/A 04/11/2014   Procedure: ANTERIOR CERVICAL DECOMPRESSION/DISCECTOMY FUSION 1  LEVEL;  Surgeon: Sinclair Ship, MD;  Location: [place];  Service: Orthopedics;  Laterality: N/A;  Anterior cervical decompression fusion, cervical 7-thoracic 1 with instrumentation and allograft  . CHOLECYSTECTOMY N/A 03/06/2018   Procedure: LAPAROSCOPIC CHOLECYSTECTOMY;  Surgeon: Greer Pickerel, MD;  Location: [facility];  Service: General;  Laterality: N/A;  . COLONOSCOPY  2017  . FACIAL COSMETIC SURGERY    . FOOT SURGERY Bilateral   . GASTRIC BYPASS    . NECK EXPLORATION  2014, 2015  . TUBAL LIGATION    . UPPER GI ENDOSCOPY  2017   Social History: Social History   Socioeconomic History  . Marital status: Married    Spouse name: Not on file  . Number of children: 2  . Years of education: 89  . Highest education level: Not on file  Occupational History  . Occupation: [employer]  Social Needs  . Financial resource strain: Not on file  . Food insecurity:    Worry: Not on file    Inability: Not on file  . Transportation needs:    Medical: Not on file    Non-medical: Not on file  Tobacco Use  . Smoking status: Light Tobacco Smoker    Packs/day: 0.25    Years: 2.00    Pack years: 0.50    Types: Cigarettes  . Smokeless tobacco: Never Used  Substance and Sexual Activity  . Alcohol use: No  . Drug use: No  . Sexual activity: Yes    Birth control/protection: Post-menopausal  Lifestyle  . Physical activity:    Days per week: Not on file  Minutes per session: Not on file  . Stress: Not on file  Relationships  . Social connections:    Talks on phone: Not on file    Gets together: Not on file    Attends religious service: Not on file    Active member of club or organization: Not on file    Attends meetings of clubs or organizations: Not on file    Relationship status: Not on file  Other Topics Concern  . Not on file  Social History Narrative   Lives with husband   Caffeine use: tea daily   Right handed    Family History: Family History  Problem Relation Age of  Onset  . Healthy Mother   . Healthy Father   . Colon cancer Sister    Allergies: Allergies  Allergen Reactions  . Augmentin [Amoxicillin-Pot Clavulanate] Diarrhea   Medications: See med rec.  Review of Systems: No fevers, chills, night sweats, weight loss, chest pain, or shortness of breath.   Objective:    General: Well Developed, well nourished, and in no acute distress.  Neuro: Alert and oriented x3, extra-ocular muscles intact, sensation grossly intact.  HEENT: Normocephalic, atraumatic, pupils equal round reactive to light, neck supple, no masses, no lymphadenopathy, thyroid nonpalpable.  Skin: Warm and dry, no rashes. Cardiac: Regular rate and rhythm, no murmurs rubs or gallops, no lower extremity edema.  Respiratory: Clear to auscultation bilaterally. Not using accessory muscles, speaking in full sentences. Chest wall: Tender to palpation of the right costal margin, as well as the costochondral junction.  Chest wall strapped with compressive dressing.  Impression and Recommendations:    Costochondritis Acute right costochondritis, rib belt, tramadol, out of work for 2 weeks (unable to work with limited restrictions.) Chest x-ray done in urgent care 3 days ago was negative. I suppose I will be seeing her back to fill out FMLA paperwork.  History of laparoscopic cholecystectomy Pain of biliary colic is now resolved.  Gastritis noted on EGD Did well with pantoprazole and Carafate.  Lumbar degenerative disc disease Right thigh radicular symptoms. Cymbalta, Lyrica, physical therapy have already been tried, awaiting MRI in anticipation of a right selective L4 epidural.   Chest mass Questionable density in the chest, anterior, only seen on one image. I do think we need to go and proceed with a CT of the chest with IV contrast.  Thyroid nodule Ordered diagnostic ultrasound.   ___________________________________________ Gwen Her. [name], M.D., ABFM., CAQSM.  Primary Care and Sports Medicine [facility]  Adjunct Professor of [place] [employer]

## 2018-06-27 NOTE — Assessment & Plan Note (Signed)
Acute right costochondritis, rib belt, tramadol, out of work for 2 weeks (unable to work with limited restrictions.) Chest x-ray done in urgent care 3 days ago was negative. I suppose I will be seeing her back to fill out FMLA paperwork.

## 2018-06-28 NOTE — Addendum Note (Signed)
Addended by: Alena Bills R on: 06/28/2018 08:15 AM   Modules accepted: Orders

## 2018-06-28 NOTE — Progress Notes (Signed)
MRI order updated per Provider request

## 2018-07-03 ENCOUNTER — Other Ambulatory Visit: Payer: Self-pay

## 2018-07-03 ENCOUNTER — Ambulatory Visit (INDEPENDENT_AMBULATORY_CARE_PROVIDER_SITE_OTHER): Payer: 59

## 2018-07-03 DIAGNOSIS — R0789 Other chest pain: Secondary | ICD-10-CM

## 2018-07-03 DIAGNOSIS — R222 Localized swelling, mass and lump, trunk: Secondary | ICD-10-CM

## 2018-07-03 DIAGNOSIS — R2230 Localized swelling, mass and lump, unspecified upper limb: Secondary | ICD-10-CM

## 2018-07-03 DIAGNOSIS — M5136 Other intervertebral disc degeneration, lumbar region: Secondary | ICD-10-CM | POA: Diagnosis not present

## 2018-07-03 DIAGNOSIS — E041 Nontoxic single thyroid nodule: Secondary | ICD-10-CM | POA: Insufficient documentation

## 2018-07-03 MED ORDER — IOPAMIDOL (ISOVUE-300) INJECTION 61%
75.0000 mL | Freq: Once | INTRAVENOUS | Status: AC | PRN
  Administered 2018-07-03: 75 mL via INTRAVENOUS

## 2018-07-03 NOTE — Addendum Note (Signed)
Addended by: Silverio Decamp on: 07/03/2018 05:46 PM   Modules accepted: Orders

## 2018-07-03 NOTE — Assessment & Plan Note (Signed)
Ordered diagnostic ultrasound.

## 2018-07-04 ENCOUNTER — Encounter: Payer: Self-pay | Admitting: Sports Medicine

## 2018-07-04 ENCOUNTER — Ambulatory Visit: Payer: 59 | Admitting: Sports Medicine

## 2018-07-04 DIAGNOSIS — M940 Chondrocostal junction syndrome [Tietze]: Secondary | ICD-10-CM | POA: Diagnosis not present

## 2018-07-04 DIAGNOSIS — E8881 Metabolic syndrome: Secondary | ICD-10-CM | POA: Diagnosis not present

## 2018-07-04 DIAGNOSIS — E669 Obesity, unspecified: Secondary | ICD-10-CM | POA: Insufficient documentation

## 2018-07-04 DIAGNOSIS — M5136 Other intervertebral disc degeneration, lumbar region: Secondary | ICD-10-CM | POA: Diagnosis not present

## 2018-07-04 DIAGNOSIS — M51369 Other intervertebral disc degeneration, lumbar region without mention of lumbar back pain or lower extremity pain: Secondary | ICD-10-CM

## 2018-07-04 MED ORDER — SEMAGLUTIDE (1 MG/DOSE) 2 MG/1.5ML ~~LOC~~ SOPN: 1.0000 mg | PEN_INJECTOR | SUBCUTANEOUS | 11 refills | Status: DC

## 2018-07-04 MED ORDER — METFORMIN HCL ER 500 MG PO TB24: 500.0000 mg | ORAL_TABLET | Freq: Every day | ORAL | 11 refills | Status: DC

## 2018-07-04 MED ORDER — SEMAGLUTIDE(0.25 OR 0.5MG/DOS) 2 MG/1.5ML ~~LOC~~ SOPN: 0.5000 mg | PEN_INJECTOR | SUBCUTANEOUS | 1 refills | Status: DC

## 2018-07-04 NOTE — Progress Notes (Signed)
Paperwork for patient that was completed at visit faxed successfully. Original copy given to patient, a copy "quick scanned" in pt's documents, and a copy was sent to central scan.

## 2018-07-04 NOTE — Assessment & Plan Note (Signed)
Adding Ozempic, metformin. Return in a month to evaluate tolerance of Ozempic.

## 2018-07-04 NOTE — Assessment & Plan Note (Signed)
Right-sided thigh pain, back pain with L4-L5 central canal spinal stenosis. Failed conservative measures, proceeding with right L4-L5 interlaminar epidural.

## 2018-07-04 NOTE — Addendum Note (Signed)
Addended by: Silverio Decamp on: 07/04/2018 10:16 AM   Modules accepted: Orders

## 2018-07-04 NOTE — Assessment & Plan Note (Signed)
Still with some pain, I extended her leave a little bit longer. Disability paperwork filled out today.

## 2018-07-04 NOTE — Progress Notes (Signed)
Subjective:    CC: Back pain  HPI: Sara Wolfe is a pleasant 58 year old female, we have been treating for back pain, at this point she has failed conservative measures, she continues to have soreness in her back with radiation to the anterior right thigh.  MRI was obtained, the results of which will be dictated below.  Costochondritis: Still with some discomfort, currently using NSAIDs but feels as though she may need a little more time out of work.  Obesity/metabolic syndrome: Wonders what else can be done to help treat this.  She understands that we are going to hold off on any more phentermine for now.  I reviewed the past medical history, family history, social history, surgical history, and allergies today and no changes were needed.  Please see the problem list section below in epic for further details.  Past Medical History: Past Medical History:  Diagnosis Date  . Gallstones   . GERD (gastroesophageal reflux disease)    history of prior to gastric bypass  . Hot flashes, menopausal   . Medical history non-contributory   . Migraine   . OSA on CPAP   . PONV (postoperative nausea and vomiting)   . Rectocele 02/13/2018  . Ventral hernia 2018   Small, noted on CT Abd/pel  . Vitamin B 12 deficiency    Past Surgical History: Past Surgical History:  Procedure Laterality Date  . ANTERIOR CERVICAL DECOMP/DISCECTOMY FUSION N/A 10/04/2013   Procedure: ANTERIOR CERVICAL DECOMPRESSION/DISCECTOMY FUSION 3 LEVELS;  Surgeon: Sinclair Ship, MD;  Location: [place];  Service: Orthopedics;  Laterality: N/A;  Anterior cervical decompression fusion cervical 4-5, cervical 5-6, cervical 6-7 with instrumentation and allograft  . ANTERIOR CERVICAL DECOMP/DISCECTOMY FUSION N/A 04/11/2014   Procedure: ANTERIOR CERVICAL DECOMPRESSION/DISCECTOMY FUSION 1 LEVEL;  Surgeon: Sinclair Ship, MD;  Location: [place];  Service: Orthopedics;  Laterality: N/A;  Anterior cervical decompression fusion, cervical  7-thoracic 1 with instrumentation and allograft  . CHOLECYSTECTOMY N/A 03/06/2018   Procedure: LAPAROSCOPIC CHOLECYSTECTOMY;  Surgeon: Greer Pickerel, MD;  Location[facility];  Service: General;  Laterality: N/A;  . COLONOSCOPY  2017  . FACIAL COSMETIC SURGERY    . FOOT SURGERY Bilateral   . GASTRIC BYPASS    . NECK EXPLORATION  2014, 2015  . TUBAL LIGATION    . UPPER GI ENDOSCOPY  2017   Social History: Social History   Socioeconomic History  . Marital status: Married    Spouse name: Not on file  . Number of children: 2  . Years of education: 12  . Highest education level: Not on file  Occupational History  . Occupation: [employer]  Social Needs  . Financial resource strain: Not on file  . Food insecurity:    Worry: Not on file    Inability: Not on file  . Transportation needs:    Medical: Not on file    Non-medical: Not on file  Tobacco Use  . Smoking status: Light Tobacco Smoker    Packs/day: 0.25    Years: 2.00    Pack years: 0.50    Types: Cigarettes  . Smokeless tobacco: Never Used  Substance and Sexual Activity  . Alcohol use: No  . Drug use: No  . Sexual activity: Yes    Birth control/protection: Post-menopausal  Lifestyle  . Physical activity:    Days per week: Not on file    Minutes per session: Not on file  . Stress: Not on file  Relationships  . Social connections:  Talks on phone: Not on file    Gets together: Not on file    Attends religious service: Not on file    Active member of club or organization: Not on file    Attends meetings of clubs or organizations: Not on file    Relationship status: Not on file  Other Topics Concern  . Not on file  Social History Narrative   Lives with husband   Caffeine use: tea daily   Right handed    Family History: Family History  Problem Relation Age of Onset  . Healthy Mother   . Healthy Father   . Colon cancer Sister    Allergies: Allergies  Allergen Reactions  . Augmentin [Amoxicillin-Pot  Clavulanate] Diarrhea   Medications: See med rec.  Review of Systems: No fevers, chills, night sweats, weight loss, chest pain, or shortness of breath.   Objective:    General: Well Developed, well nourished, and in no acute distress.  Neuro: Alert and oriented x3, extra-ocular muscles intact, sensation grossly intact.  HEENT: Normocephalic, atraumatic, pupils equal round reactive to light, neck supple, no masses, no lymphadenopathy, thyroid nonpalpable.  Skin: Warm and dry, no rashes. Cardiac: Regular rate and rhythm, no murmurs rubs or gallops, no lower extremity edema.  Respiratory: Clear to auscultation bilaterally. Not using accessory muscles, speaking in full sentences.  Impression and Recommendations:    Metabolic syndrome Adding Ozempic, metformin. Return in a month to evaluate tolerance of Ozempic.  Lumbar degenerative disc disease Right-sided thigh pain, back pain with L4-L5 central canal spinal stenosis. Failed conservative measures, proceeding with right L4-L5 interlaminar epidural.  Costochondritis Still with some pain, I extended her leave a little bit longer. Disability paperwork filled out today.   ___________________________________________ Gwen Her. [name], M.D., ABFM., CAQSM. Primary Care and Sports Medicine [facility]  Adjunct Professor of [place] [employer]

## 2018-07-07 ENCOUNTER — Ambulatory Visit: Payer: Self-pay

## 2018-07-07 NOTE — Addendum Note (Signed)
Addended by: Alena Bills R on: 07/07/2018 09:59 AM   Modules accepted: Orders

## 2018-07-11 ENCOUNTER — Encounter: Payer: Self-pay | Admitting: Sports Medicine

## 2018-07-19 ENCOUNTER — Other Ambulatory Visit (HOSPITAL_COMMUNITY)
Admission: RE | Admit: 2018-07-19 | Discharge: 2018-07-19 | Disposition: A | Discharge status: Home / Self Care | Payer: 59 | Source: Ambulatory Visit | Attending: Physician Assistant | Admitting: Physician Assistant

## 2018-07-19 ENCOUNTER — Ambulatory Visit
Admission: RE | Admit: 2018-07-19 | Discharge: 2018-07-19 | Disposition: A | Discharge status: Home / Self Care | Payer: 59 | Source: Ambulatory Visit | Attending: Sports Medicine | Admitting: Sports Medicine

## 2018-07-19 DIAGNOSIS — E041 Nontoxic single thyroid nodule: Secondary | ICD-10-CM | POA: Insufficient documentation

## 2018-07-19 NOTE — Procedures (Signed)
PROCEDURE SUMMARY:  Using direct ultrasound guidance, 5 passes were made using 25 g needles into the nodule within the left lobe of the thyroid.   Ultrasound was used to confirm needle placements on all occasions.   EBL = trace  Specimens were sent to Pathology for analysis.  See procedure note under Imaging tab in Epic for full procedure details.  Alizee Maple S Kandee Escalante PA-C 07/19/2018 10:08 AM

## 2018-07-28 ENCOUNTER — Ambulatory Visit (INDEPENDENT_AMBULATORY_CARE_PROVIDER_SITE_OTHER): Payer: 59 | Admitting: Sports Medicine

## 2018-07-28 VITALS — BP 111/56 | HR 83

## 2018-07-28 DIAGNOSIS — E538 Deficiency of other specified B group vitamins: Secondary | ICD-10-CM

## 2018-07-28 MED ORDER — CYANOCOBALAMIN 1000 MCG/ML IJ SOLN
1000.0000 ug | Freq: Once | INTRAMUSCULAR | Status: AC
  Administered 2018-07-28: 1000 ug via INTRAMUSCULAR

## 2018-07-28 NOTE — Progress Notes (Signed)
Established Patient Office Visit  Subjective:  Patient ID: Sara Wolfe, female    DOB: 04/23/1960  Age: 58 y.o. MRN: [id number]  CC:  Chief Complaint  Patient presents with  . B12 Injection    HPI Sara Wolfe is here for a vitamin B 12 injection. Denies muscle cramps, weakness or irregular heart rate.   Past Medical History:  Diagnosis Date  . Gallstones   . GERD (gastroesophageal reflux disease)    history of prior to gastric bypass  . Hot flashes, menopausal   . Medical history non-contributory   . Migraine   . OSA on CPAP   . PONV (postoperative nausea and vomiting)   . Rectocele 02/13/2018  . Ventral hernia 2018   Small, noted on CT Abd/pel  . Vitamin B 12 deficiency     Past Surgical History:  Procedure Laterality Date  . ANTERIOR CERVICAL DECOMP/DISCECTOMY FUSION N/A 10/04/2013   Procedure: ANTERIOR CERVICAL DECOMPRESSION/DISCECTOMY FUSION 3 LEVELS;  Surgeon: Sinclair Ship, MD;  Location: [place];  Service: Orthopedics;  Laterality: N/A;  Anterior cervical decompression fusion cervical 4-5, cervical 5-6, cervical 6-7 with instrumentation and allograft  . ANTERIOR CERVICAL DECOMP/DISCECTOMY FUSION N/A 04/11/2014   Procedure: ANTERIOR CERVICAL DECOMPRESSION/DISCECTOMY FUSION 1 LEVEL;  Surgeon: Sinclair Ship, MD;  Location: [facility];  Service: Orthopedics;  Laterality: N/A;  Anterior cervical decompression fusion, cervical 7-thoracic 1 with instrumentation and allograft  . CHOLECYSTECTOMY N/A 03/06/2018   Procedure: LAPAROSCOPIC CHOLECYSTECTOMY;  Surgeon: Greer Pickerel, MD;  Location: [facility];  Service: General;  Laterality: N/A;  . COLONOSCOPY  2017  . FACIAL COSMETIC SURGERY    . FOOT SURGERY Bilateral   . GASTRIC BYPASS    . NECK EXPLORATION  2014, 2015  . TUBAL LIGATION    . UPPER GI ENDOSCOPY  2017    Family History  Problem Relation Age of Onset  . Healthy Mother   . Healthy Father   . Colon cancer Sister     Social History   Socioeconomic  History  . Marital status: Married    Spouse name: Not on file  . Number of children: 2  . Years of education: 33  . Highest education level: Not on file  Occupational History  . Occupation: [employer]  Social Needs  . Financial resource strain: Not on file  . Food insecurity:    Worry: Not on file    Inability: Not on file  . Transportation needs:    Medical: Not on file    Non-medical: Not on file  Tobacco Use  . Smoking status: Light Tobacco Smoker    Packs/day: 0.25    Years: 2.00    Pack years: 0.50    Types: Cigarettes  . Smokeless tobacco: Never Used  Substance and Sexual Activity  . Alcohol use: No  . Drug use: No  . Sexual activity: Yes    Birth control/protection: Post-menopausal  Lifestyle  . Physical activity:    Days per week: Not on file    Minutes per session: Not on file  . Stress: Not on file  Relationships  . Social connections:    Talks on phone: Not on file    Gets together: Not on file    Attends religious service: Not on file    Active member of club or organization: Not on file    Attends meetings of clubs or organizations: Not on file    Relationship status: Not on file  . Intimate partner violence:  Fear of current or ex partner: Not on file    Emotionally abused: Not on file    Physically abused: Not on file    Forced sexual activity: Not on file  Other Topics Concern  . Not on file  Social History Narrative   Lives with husband   Caffeine use: tea daily   Right handed     Outpatient Medications Prior to Visit  Medication Sig Dispense Refill  . AMBULATORY NON FORMULARY MEDICATION Continuous positive airway pressure (CPAP) machine autopap 5-21 cm of H2O pressure (or autoPAP if available), with all supplemental supplies as needed.  To [employer]. AHI=15 1 each 0  . amitriptyline (ELAVIL) 50 MG tablet TAKE 1/2 TABLET BY MOUTH AT BEDTIME FOR 7 DAYS THEN TAKE 1 TABLET AT BEDTIME 270 tablet 1  . bimatoprost (LATISSE) 0.03 % ophthalmic  solution Place 1gtt on applicator and apply evenly along the skin of the upper eyelid at base of eyelashes qhs; use new applicator for 2nd eye (Patient taking differently: Place 1 drop into both eyes at bedtime. Place 1gtt on applicator and apply evenly along the skin of the upper eyelid at base of eyelashes qhs; use new applicator for 2nd eye) 5 mL 12  . cyanocobalamin (,VITAMIN B-12,) 1000 MCG/ML injection Inject 1,000 mcg into the muscle every 30 (thirty) days. Inject 1000 mcg weekly intramuscular for 1 month then monthly thereafter 1 mL 0  . DULoxetine (CYMBALTA) 60 MG capsule Take 1 capsule (60 mg total) by mouth daily. 90 capsule 0  . Flibanserin (ADDYI) 100 MG TABS Take 1 tablet by mouth at bedtime. (Patient taking differently: Take 100 mg by mouth at bedtime. ) 30 tablet 6  . folic acid (FOLVITE) 1 MG tablet TAKE 5 TABLETS (5 MG TOTAL) BY MOUTH DAILY. 90 tablet 3  . metFORMIN (GLUCOPHAGE XR) 500 MG 24 hr tablet Take 1 tablet (500 mg total) by mouth daily with breakfast. 30 tablet 11  . methocarbamol (ROBAXIN) 500 MG tablet TAKE 1 TABLET BY MOUTH THREE TIMES A DAY 90 tablet 3  . nitroGLYCERIN (NITROSTAT) 0.4 MG SL tablet Place 1 tablet (0.4 mg total) under the tongue every 5 (five) minutes as needed for chest pain. 30 tablet 0  . phentermine (ADIPEX-P) 37.5 MG tablet One tab by mouth qAM 30 tablet 0  . pregabalin (LYRICA) 200 MG capsule TAKE 1 CAPSULE BY MOUTH THREE TIMES A DAY 90 capsule 1  . rizatriptan (MAXALT-MLT) 10 MG disintegrating tablet TAKE 1 TABLET (10 MG TOTAL) BY MOUTH AS NEEDED FOR MIGRAINE. MAY REPEAT IN 2 HOURS IF NEEDED 10 tablet 3  . Semaglutide, 1 MG/DOSE, (OZEMPIC, 1 MG/DOSE,) 2 MG/1.5ML SOPN Inject 1 mg into the skin once a week. 4 pen 11  . Semaglutide,0.25 or 0.5MG /DOS, (OZEMPIC, 0.25 OR 0.5 MG/DOSE,) 2 MG/1.5ML SOPN Inject 0.5 mg into the skin once a week. Inject 0.25 mg subq qwk for 4 wk then inject 0.5 mg subq qwk x4 wk, then go to full dose pen qwk 4 pen 1  . Topiramate  ER (TROKENDI XR) 50 MG CP24 Take 1 capsule by mouth daily. 30 capsule 3  . traMADol (ULTRAM) 50 MG tablet Take 1 tablet (50 mg total) by mouth every 8 (eight) hours as needed. for pain 90 tablet 0  . Estradiol 10 MCG TABS vaginal tablet 1 tablet per vagina 2 nights per week. (Patient taking differently: Place 10 mcg vaginally 2 (two) times a week. 1 tablet per vagina 2 nights per week.) 30 tablet 12  No facility-administered medications prior to visit.     Allergies  Allergen Reactions  . Amoxicillin-Pot Clavulanate Diarrhea    ROS Review of Systems    Objective:    Physical Exam  BP (!) 111/56   Pulse 83   SpO2 99%  Wt Readings from Last 3 Encounters:  07/04/18 171 lb (77.6 kg)  06/27/18 168 lb (76.2 kg)  06/24/18 167 lb (75.8 kg)     There are no preventive care reminders to display for this patient.  There are no preventive care reminders to display for this patient.  Lab Results  Component Value Date   TSH 1.20 06/12/2018   Lab Results  Component Value Date   WBC 6.2 06/12/2018   HGB 12.8 06/12/2018   HCT 38.2 06/12/2018   MCV 91.8 06/12/2018   PLT 240 06/12/2018   Lab Results  Component Value Date   NA 144 06/12/2018   K 4.1 06/12/2018   CO2 26 06/12/2018   GLUCOSE 79 06/12/2018   BUN 14 06/12/2018   CREATININE 0.65 06/12/2018   BILITOT 0.4 06/12/2018   ALKPHOS 69 03/03/2018   AST 15 06/12/2018   ALT 15 06/12/2018   PROT 5.6 (L) 06/12/2018   ALBUMIN 3.7 03/03/2018   CALCIUM 8.4 (L) 06/12/2018   ANIONGAP 7 03/03/2018   Lab Results  Component Value Date   CHOL 172 06/12/2018   Lab Results  Component Value Date   HDL 74 06/12/2018   Lab Results  Component Value Date   LDLCALC 84 06/12/2018   Lab Results  Component Value Date   TRIG 49 06/12/2018   Lab Results  Component Value Date   CHOLHDL 2.3 06/12/2018   Lab Results  Component Value Date   HGBA1C 4.8 06/12/2018      Assessment & Plan:  Vitamin B12 deficiency - Patient  tolerated injection well without complications. Patient advised to schedule next injection 30 days from today.    Problem List Items Addressed This Visit    Vitamin B 12 deficiency - Primary   Relevant Medications   cyanocobalamin ((VITAMIN B-12)) injection 1,000 mcg (Completed) (Start on 07/28/2018  8:45 AM)      Meds ordered this encounter  Medications  . cyanocobalamin ((VITAMIN B-12)) injection 1,000 mcg    Follow-up: Return in about 4 weeks (around 08/25/2018) for vitamin B12 injection. Durene Romans, Monico Blitz, [place]

## 2018-07-29 ENCOUNTER — Other Ambulatory Visit: Payer: Self-pay | Admitting: Sports Medicine

## 2018-07-29 DIAGNOSIS — E8881 Metabolic syndrome: Secondary | ICD-10-CM

## 2018-08-01 ENCOUNTER — Other Ambulatory Visit: Payer: Self-pay | Admitting: Sports Medicine

## 2018-08-01 DIAGNOSIS — G43809 Other migraine, not intractable, without status migrainosus: Secondary | ICD-10-CM

## 2018-08-02 ENCOUNTER — Encounter: Payer: Self-pay | Admitting: Sports Medicine

## 2018-08-02 ENCOUNTER — Other Ambulatory Visit: Payer: Self-pay | Admitting: Sports Medicine

## 2018-08-03 ENCOUNTER — Other Ambulatory Visit: Payer: Self-pay | Admitting: Sports Medicine

## 2018-08-03 DIAGNOSIS — Z981 Arthrodesis status: Secondary | ICD-10-CM

## 2018-08-04 ENCOUNTER — Encounter: Payer: Self-pay | Admitting: Sports Medicine

## 2018-08-06 ENCOUNTER — Other Ambulatory Visit: Payer: Self-pay | Admitting: Sports Medicine

## 2018-08-06 DIAGNOSIS — M5416 Radiculopathy, lumbar region: Secondary | ICD-10-CM

## 2018-08-25 ENCOUNTER — Other Ambulatory Visit: Payer: Self-pay | Admitting: Sports Medicine

## 2018-08-28 ENCOUNTER — Other Ambulatory Visit: Payer: Self-pay | Admitting: Sports Medicine

## 2018-08-28 ENCOUNTER — Ambulatory Visit (INDEPENDENT_AMBULATORY_CARE_PROVIDER_SITE_OTHER): Payer: 59 | Admitting: Sports Medicine

## 2018-08-28 ENCOUNTER — Other Ambulatory Visit: Payer: Self-pay

## 2018-08-28 ENCOUNTER — Encounter: Payer: Self-pay | Admitting: Sports Medicine

## 2018-08-28 VITALS — BP 106/73 | HR 69 | Ht 63.0 in | Wt 171.0 lb

## 2018-08-28 DIAGNOSIS — E538 Deficiency of other specified B group vitamins: Secondary | ICD-10-CM

## 2018-08-28 DIAGNOSIS — M5136 Other intervertebral disc degeneration, lumbar region: Secondary | ICD-10-CM

## 2018-08-28 DIAGNOSIS — G43809 Other migraine, not intractable, without status migrainosus: Secondary | ICD-10-CM

## 2018-08-28 DIAGNOSIS — M51369 Other intervertebral disc degeneration, lumbar region without mention of lumbar back pain or lower extremity pain: Secondary | ICD-10-CM

## 2018-08-28 MED ORDER — CYANOCOBALAMIN 1000 MCG/ML IJ SOLN
1000.0000 ug | Freq: Once | INTRAMUSCULAR | Status: AC
  Administered 2018-08-28: 1000 ug via INTRAMUSCULAR

## 2018-08-28 NOTE — Assessment & Plan Note (Signed)
L4-5 central canal stenosis that is 75% better after a right L4-L5 epidural, transforaminal.

## 2018-08-28 NOTE — Assessment & Plan Note (Signed)
Headaches every day. Minimal improvement with Trokendi, but eventually this became intolerable with significant word finding difficulties that resolved when she discontinued the medication. Already doing amitriptyline, blood pressure is a bit low so this precludes using a beta-blocker for migraine prevention. Because of this I would like neurology to weigh in, to discuss 1 of the newer agents such as Aimovig, Ajovi, or Emgality.

## 2018-08-28 NOTE — Progress Notes (Signed)
Subjective:    CC: follow up headaches  HPI: Patient presents for follow up regarding her headaches. Her headaches are daily, moderate to severe and have been occurring for about 30 years. The pain is bilateral (temporal radiating posteriorly), non-pulsating, and worse with bright lights. They are improved with rest and she occasionally takes Advil but cannot take very much due to gastric surgery.  She previously was taking Topiramate but was switched to Trokendi due to cognitive adverse effects, however she stopped taking the Trokendi as she was still having similar word-finding difficulties.   Patient also had an epidural recently for her right leg pain. The epidural has helped about 75%.    I reviewed the past medical history, family history, social history, surgical history, and allergies today and no changes were needed.  Please see the problem list section below in epic for further details.  Past Medical History: Past Medical History:  Diagnosis Date  . Gallstones   . GERD (gastroesophageal reflux disease)    history of prior to gastric bypass  . Hot flashes, menopausal   . Medical history non-contributory   . Migraine   . OSA on CPAP   . PONV (postoperative nausea and vomiting)   . Rectocele 02/13/2018  . Ventral hernia 2018   Small, noted on CT Abd/pel  . Vitamin B 12 deficiency    Past Surgical History: Past Surgical History:  Procedure Laterality Date  . ANTERIOR CERVICAL DECOMP/DISCECTOMY FUSION N/A 10/04/2013   Procedure: ANTERIOR CERVICAL DECOMPRESSION/DISCECTOMY FUSION 3 LEVELS;  Surgeon: Sinclair Ship, MD;  Location: [place];  Service: Orthopedics;  Laterality: N/A;  Anterior cervical decompression fusion cervical 4-5, cervical 5-6, cervical 6-7 with instrumentation and allograft  . ANTERIOR CERVICAL DECOMP/DISCECTOMY FUSION N/A 04/11/2014   Procedure: ANTERIOR CERVICAL DECOMPRESSION/DISCECTOMY FUSION 1 LEVEL;  Surgeon: Sinclair Ship, MD;  Location: [place];  Service: Orthopedics;  Laterality: N/A;  Anterior cervical decompression fusion, cervical 7-thoracic 1 with instrumentation and allograft  . CHOLECYSTECTOMY N/A 03/06/2018   Procedure: LAPAROSCOPIC CHOLECYSTECTOMY;  Surgeon: Greer Pickerel, MD;  Location: [facility];  Service: General;  Laterality: N/A;  . COLONOSCOPY  2017  . FACIAL COSMETIC SURGERY    . FOOT SURGERY Bilateral   . GASTRIC BYPASS    . NECK EXPLORATION  2014, 2015  . TUBAL LIGATION    . UPPER GI ENDOSCOPY  2017   Social History: Social History   Socioeconomic History  . Marital status: Married    Spouse name: Not on file  . Number of children: 2  . Years of education: 34  . Highest education level: Not on file  Occupational History  . Occupation: [employer]  Social Needs  . Financial resource strain: Not on file  . Food insecurity    Worry: Not on file    Inability: Not on file  . Transportation needs    Medical: Not on file    Non-medical: Not on file  Tobacco Use  . Smoking status: Light Tobacco Smoker    Packs/day: 0.25    Years: 2.00    Pack years: 0.50    Types: Cigarettes  . Smokeless tobacco: Never Used  Substance and Sexual Activity  . Alcohol use: No  . Drug use: No  . Sexual activity: Yes    Birth control/protection: Post-menopausal  Lifestyle  . Physical activity    Days per week: Not on file    Minutes per session: Not on file  . Stress: Not on file  Relationships  .  Social Herbalist on phone: Not on file    Gets together: Not on file    Attends religious service: Not on file    Active member of club or organization: Not on file    Attends meetings of clubs or organizations: Not on file    Relationship status: Not on file  Other Topics Concern  . Not on file  Social History Narrative   Lives with husband   Caffeine use: tea daily   Right handed    Family History: Family History  Problem Relation Age of Onset  . Healthy Mother   . Healthy Father   . Colon cancer  Sister    Allergies: Allergies  Allergen Reactions  . Amoxicillin-Pot Clavulanate Diarrhea   Medications: See med rec.  Review of Systems: No fevers, chills, night sweats, weight loss, chest pain, or shortness of breath.   Objective:    General: Well Developed, well nourished, and in no acute distress.  Neuro: Alert and oriented x3, extra-ocular muscles intact, sensation grossly intact.  HEENT: Normocephalic, atraumatic, pupils equal round reactive to light, neck supple, no masses, no lymphadenopathy, thyroid nonpalpable.  Skin: Warm and dry, no rashes. Cardiac: Regular rate and rhythm, no murmurs rubs or gallops, no lower extremity edema.  Respiratory: Clear to auscultation bilaterally. Not using accessory muscles, speaking in full sentences.   Impression and Recommendations:   A: Migraine headache; lumbar spine disc disease P:  Migraines - referral to neurology since patient has low BP and should not be started on beta blocker. She would likely benefit from newer migraine agents such as Avjoy or Emgality, which neurology should monitor.  Lumbar spine disc disease- patient states she is having sufficient relief from the epidural so we will not add further treatment at this time.  Migraine headache Headaches every day. Minimal improvement with Trokendi, but eventually this became intolerable with significant word finding difficulties that resolved when she discontinued the medication. Already doing amitriptyline, blood pressure is a bit low so this precludes using a beta-blocker for migraine prevention. Because of this I would like neurology to weigh in, to discuss 1 of the newer agents such as Aimovig, Ajovi, or Emgality.  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   ___________________________________________ Gwen Her. [name], M.D., ABFM., CAQSM. Primary Care and Sports Medicine [facility]  Adjunct Professor of [place] of [facility] of Medicine

## 2018-08-31 ENCOUNTER — Encounter: Payer: Self-pay | Admitting: Sports Medicine

## 2018-08-31 DIAGNOSIS — M5416 Radiculopathy, lumbar region: Secondary | ICD-10-CM

## 2018-09-01 MED ORDER — GABAPENTIN 800 MG PO TABS: ORAL_TABLET | ORAL | 3 refills | Status: DC

## 2018-09-07 ENCOUNTER — Other Ambulatory Visit: Payer: Self-pay | Admitting: Sports Medicine

## 2018-09-07 DIAGNOSIS — M5416 Radiculopathy, lumbar region: Secondary | ICD-10-CM

## 2018-09-08 ENCOUNTER — Ambulatory Visit: Payer: 59 | Admitting: Sports Medicine

## 2018-09-08 ENCOUNTER — Ambulatory Visit (INDEPENDENT_AMBULATORY_CARE_PROVIDER_SITE_OTHER): Payer: 59

## 2018-09-08 ENCOUNTER — Encounter: Payer: Self-pay | Admitting: Sports Medicine

## 2018-09-08 ENCOUNTER — Other Ambulatory Visit: Payer: Self-pay

## 2018-09-08 DIAGNOSIS — R55 Syncope and collapse: Secondary | ICD-10-CM | POA: Diagnosis not present

## 2018-09-08 DIAGNOSIS — R222 Localized swelling, mass and lump, trunk: Secondary | ICD-10-CM

## 2018-09-08 NOTE — Assessment & Plan Note (Addendum)
Initial EKG at [facility] was sinus bradycardia with occasional PVCs. Labs were overall unremarkable. Chest x-ray did show a left lower lobe infiltrate. Rechecking all labs including a d-dimer, chest x-ray, I do think she needs a nuclear stress test considering loss of consciousness with bradycardia and hypotension necessitating CPR, fluid resuscitation, pressors. No convulsions, no tongue biting, no incontinence to suggest a seizure.  D-dimer has come back markedly elevated, going to try and contact the patient to get a stat CT angiogram of the pulmonary arteries at [facility] right now.  BUN and creatinine are normal.

## 2018-09-08 NOTE — Progress Notes (Addendum)
Subjective:    CC: Syncope, chest pain  HPI: Sara Wolfe is a pleasant 58 year old female, 6 days ago she was in [place], she was watching a baseball game, she started to feel faint, her vision started to go, and she was told by family members that she started having difficulty speaking, she does not endorse any weakness on either side of the body.  According to bystanders she lost consciousness, they could not find a pulse and they started CPR.  Eventually a pulse was found by EMS in the 66Y systolic, with a low pulse rate in the 50s.  She was hypothermic, she was treated with 2 L of normal saline with EMS without sufficient blood pressure improvement and was started on norepinephrine as a pressor.  In the ED she got some more fluids.  An echo, as well as routine labs were normal, chest x-ray did show a left lower lobe infiltrate, suspected to be related to aspiration pneumonitis.  Blood cultures were positive but for Staphylococcus epidermidis thought to be a contaminant.  She has improved considerably from, and she feels good today with the exception of severe pain in the right chest wall.  She denies any tongue biting during the episode, no incontinence.  I reviewed the past medical history, family history, social history, surgical history, and allergies today and no changes were needed.  Please see the problem list section below in epic for further details.  Past Medical History: Past Medical History:  Diagnosis Date  . Gallstones   . GERD (gastroesophageal reflux disease)    history of prior to gastric bypass  . Hot flashes, menopausal   . Medical history non-contributory   . Migraine   . OSA on CPAP   . PONV (postoperative nausea and vomiting)   . Rectocele 02/13/2018  . Ventral hernia 2018   Small, noted on CT Abd/pel  . Vitamin B 12 deficiency    Past Surgical History: Past Surgical History:  Procedure Laterality Date  . ANTERIOR CERVICAL DECOMP/DISCECTOMY FUSION N/A 10/04/2013   Procedure: ANTERIOR CERVICAL DECOMPRESSION/DISCECTOMY FUSION 3 LEVELS;  Surgeon: Sinclair Ship, MD;  Location: [place];  Service: Orthopedics;  Laterality: N/A;  Anterior cervical decompression fusion cervical 4-5, cervical 5-6, cervical 6-7 with instrumentation and allograft  . ANTERIOR CERVICAL DECOMP/DISCECTOMY FUSION N/A 04/11/2014   Procedure: ANTERIOR CERVICAL DECOMPRESSION/DISCECTOMY FUSION 1 LEVEL;  Surgeon: Sinclair Ship, MD;  Location: [place];  Service: Orthopedics;  Laterality: N/A;  Anterior cervical decompression fusion, cervical 7-thoracic 1 with instrumentation and allograft  . CHOLECYSTECTOMY N/A 03/06/2018   Procedure: LAPAROSCOPIC CHOLECYSTECTOMY;  Surgeon: Greer Pickerel, MD;  Location: [facility];  Service: General;  Laterality: N/A;  . COLONOSCOPY  2017  . FACIAL COSMETIC SURGERY    . FOOT SURGERY Bilateral   . GASTRIC BYPASS    . NECK EXPLORATION  2014, 2015  . TUBAL LIGATION    . UPPER GI ENDOSCOPY  2017   Social History: Social History   Socioeconomic History  . Marital status: Married    Spouse name: Not on file  . Number of children: 2  . Years of education: 36  . Highest education level: Not on file  Occupational History  . Occupation: [employer]  Social Needs  . Financial resource strain: Not on file  . Food insecurity    Worry: Not on file    Inability: Not on file  . Transportation needs    Medical: Not on file    Non-medical: Not on file  Tobacco Use  . Smoking status: Light Tobacco Smoker    Packs/day: 0.25    Years: 2.00    Pack years: 0.50    Types: Cigarettes  . Smokeless tobacco: Never Used  Substance and Sexual Activity  . Alcohol use: No  . Drug use: No  . Sexual activity: Yes    Birth control/protection: Post-menopausal  Lifestyle  . Physical activity    Days per week: Not on file    Minutes per session: Not on file  . Stress: Not on file  Relationships  . Social Herbalist on phone: Not on file    Gets  together: Not on file    Attends religious service: Not on file    Active member of club or organization: Not on file    Attends meetings of clubs or organizations: Not on file    Relationship status: Not on file  Other Topics Concern  . Not on file  Social History Narrative   Lives with husband   Caffeine use: tea daily   Right handed    Family History: Family History  Problem Relation Age of Onset  . Healthy Mother   . Healthy Father   . Colon cancer Sister    Allergies: Allergies  Allergen Reactions  . Amoxicillin-Pot Clavulanate Diarrhea   Medications: See med rec.  Review of Systems: No fevers, chills, night sweats, weight loss, chest pain, or shortness of breath.   Objective:    General: Well Developed, well nourished, and in no acute distress.  Neuro: Alert and oriented x3, extra-ocular muscles intact, sensation grossly intact.  HEENT: Normocephalic, atraumatic, pupils equal round reactive to light, neck supple, no masses, no lymphadenopathy, thyroid nonpalpable.  Skin: Warm and dry, no rashes. Cardiac: Regular rate and rhythm, no murmurs rubs or gallops, no lower extremity edema.  Respiratory: Clear to auscultation bilaterally. Not using accessory muscles, speaking in full sentences.  Severe pain in the right chest wall, anterior axillary line.  Impression and Recommendations:    Syncope Initial EKG at [facility] was sinus bradycardia with occasional PVCs. Labs were overall unremarkable. Chest x-ray did show a left lower lobe infiltrate. Rechecking all labs including a d-dimer, chest x-ray, I do think she needs a nuclear stress test considering loss of consciousness with bradycardia and hypotension necessitating CPR, fluid resuscitation, pressors. No convulsions, no tongue biting, no incontinence to suggest a seizure.  D-dimer has come back markedly elevated, going to try and contact the patient to get a stat CT angiogram of the pulmonary arteries at  [facility] right now.  BUN and creatinine are normal.   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 Recent x-rays after CPR do show evidence of rib fractures which would explain her chest wall pain on the right.  Hypocalcemia Noted on labs, unclear etiology but likely related to the multitude of laboratory derangements post dehydration, syncope, CPR, and aggressive resuscitation in the emergency department. I am going to add PTH levels and vitamin D levels to the blood already in the lab.   ___________________________________________ Gwen Her. [name], M.D., ABFM., CAQSM. Primary Care and Sports Medicine [facility]  Adjunct Professor of [place] of [facility] of Medicine

## 2018-09-09 ENCOUNTER — Encounter (HOSPITAL_BASED_OUTPATIENT_CLINIC_OR_DEPARTMENT_OTHER): Payer: Self-pay

## 2018-09-09 ENCOUNTER — Ambulatory Visit (HOSPITAL_BASED_OUTPATIENT_CLINIC_OR_DEPARTMENT_OTHER)
Admission: RE | Admit: 2018-09-09 | Discharge: 2018-09-09 | Disposition: A | Discharge status: Home / Self Care | Payer: 59 | Source: Ambulatory Visit | Attending: Sports Medicine | Admitting: Sports Medicine

## 2018-09-09 DIAGNOSIS — R55 Syncope and collapse: Secondary | ICD-10-CM | POA: Diagnosis not present

## 2018-09-09 LAB — COMPLETE METABOLIC PANEL WITH GFR
AG Ratio: 1.7 (calc) (ref 1.0–2.5)
ALT: 23 U/L (ref 6–29)
AST: 15 U/L (ref 10–35)
Albumin: 3.5 g/dL — ABNORMAL LOW (ref 3.6–5.1)
Alkaline phosphatase (APISO): 82 U/L (ref 37–153)
BUN: 12 mg/dL (ref 7–25)
CO2: 25 mmol/L (ref 20–32)
Calcium: 8 mg/dL — ABNORMAL LOW (ref 8.6–10.4)
Chloride: 112 mmol/L — ABNORMAL HIGH (ref 98–110)
Creat: 0.63 mg/dL (ref 0.50–1.05)
GFR, Est African American: 114 mL/min/{1.73_m2} (ref >=60)
GFR, Est Non African American: 98 mL/min/{1.73_m2} (ref >=60)
Globulin: 2.1 g/dL (calc) (ref 1.9–3.7)
Glucose, Bld: 87 mg/dL (ref 65–99)
Potassium: 3.4 mmol/L — ABNORMAL LOW (ref 3.5–5.3)
Sodium: 144 mmol/L (ref 135–146)
Total Bilirubin: 0.2 mg/dL (ref 0.2–1.2)
Total Protein: 5.6 g/dL — ABNORMAL LOW (ref 6.1–8.1)

## 2018-09-09 LAB — CBC WITH DIFFERENTIAL/PLATELET
Absolute Monocytes: 828 cells/uL (ref 200–950)
Basophils Absolute: 99 cells/uL (ref 0–200)
Basophils Relative: 1.3 %
Eosinophils Absolute: 258 cells/uL (ref 15–500)
Eosinophils Relative: 3.4 %
HCT: 41.2 % (ref 35.0–45.0)
Hemoglobin: 13.9 g/dL (ref 11.7–15.5)
Lymphs Abs: 2417 cells/uL (ref 850–3900)
MCH: 31.9 pg (ref 27.0–33.0)
MCHC: 33.7 g/dL (ref 32.0–36.0)
MCV: 94.5 fL (ref 80.0–100.0)
MPV: 10.5 fL (ref 7.5–12.5)
Monocytes Relative: 10.9 %
Neutro Abs: 3998 cells/uL (ref 1500–7800)
Neutrophils Relative %: 52.6 %
Platelets: 259 10*3/uL (ref 140–400)
RBC: 4.36 10*6/uL (ref 3.80–5.10)
RDW: 13.1 % (ref 11.0–15.0)
Total Lymphocyte: 31.8 %
WBC: 7.6 10*3/uL (ref 3.8–10.8)

## 2018-09-09 LAB — TROPONIN I: Troponin I: <0.01 ng/mL (ref <=0.0)

## 2018-09-09 LAB — D-DIMER, QUANTITATIVE: D-Dimer, Quant: 0.94 mcg/mL FEU — ABNORMAL HIGH (ref <0.50)

## 2018-09-09 LAB — BRAIN NATRIURETIC PEPTIDE: Brain Natriuretic Peptide: 26 pg/mL (ref <100)

## 2018-09-09 MED ORDER — IOHEXOL 350 MG/ML SOLN
100.0000 mL | Freq: Once | INTRAVENOUS | Status: AC | PRN
  Administered 2018-09-09: 77 mL via INTRAVENOUS

## 2018-09-09 NOTE — Addendum Note (Signed)
Addended by: Silverio Decamp on: 09/09/2018 07:33 AM   Modules accepted: Orders

## 2018-09-09 NOTE — Assessment & Plan Note (Signed)
Noted on labs, unclear etiology but likely related to the multitude of laboratory derangements post dehydration, syncope, CPR, and aggressive resuscitation in the emergency department. I am going to add PTH levels and vitamin D levels to the blood already in the lab.

## 2018-09-09 NOTE — Addendum Note (Signed)
Addended by: Silverio Decamp on: 09/09/2018 09:43 AM   Modules accepted: Orders

## 2018-09-09 NOTE — Assessment & Plan Note (Signed)
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 Recent x-rays after CPR do show evidence of rib fractures which would explain her chest wall pain on the right.

## 2018-09-13 LAB — PTH, INTACT AND CALCIUM
Calcium: 8.8 mg/dL (ref 8.6–10.4)
PTH: 28 pg/mL (ref 14–64)

## 2018-09-13 LAB — VITAMIN D 25 HYDROXY (VIT D DEFICIENCY, FRACTURES): Vit D, 25-Hydroxy: 30 ng/mL (ref 30–100)

## 2018-09-15 ENCOUNTER — Telehealth (HOSPITAL_COMMUNITY): Payer: Self-pay

## 2018-09-15 NOTE — Telephone Encounter (Signed)
Encounter complete. 

## 2018-09-20 ENCOUNTER — Ambulatory Visit (HOSPITAL_COMMUNITY)
Admission: RE | Admit: 2018-09-20 | Discharge: 2018-09-20 | Disposition: A | Discharge status: Home / Self Care | Payer: 59 | Source: Ambulatory Visit | Attending: Internal Medicine | Admitting: Internal Medicine

## 2018-09-20 ENCOUNTER — Other Ambulatory Visit: Payer: Self-pay

## 2018-09-20 ENCOUNTER — Encounter: Payer: Self-pay | Admitting: Sports Medicine

## 2018-09-20 DIAGNOSIS — R55 Syncope and collapse: Secondary | ICD-10-CM | POA: Diagnosis present

## 2018-09-20 LAB — MYOCARDIAL PERFUSION IMAGING
LV dias vol: 89 mL (ref 46–106)
LV sys vol: 35 mL
Peak HR: 92 {beats}/min
Rest HR: 56 {beats}/min
SDS: 1
SRS: 0
SSS: 1
TID: 1.4

## 2018-09-20 MED ORDER — REGADENOSON 0.4 MG/5ML IV SOLN
0.4000 mg | Freq: Once | INTRAVENOUS | Status: AC
  Administered 2018-09-20: 0.4 mg via INTRAVENOUS

## 2018-09-20 MED ORDER — TECHNETIUM TC 99M TETROFOSMIN IV KIT
10.4000 | PACK | Freq: Once | INTRAVENOUS | Status: AC | PRN
  Administered 2018-09-20: 10.4 via INTRAVENOUS
  Filled 2018-09-20: qty 11

## 2018-09-20 MED ORDER — TECHNETIUM TC 99M TETROFOSMIN IV KIT
31.7000 | PACK | Freq: Once | INTRAVENOUS | Status: AC | PRN
  Administered 2018-09-20: 31.7 via INTRAVENOUS
  Filled 2018-09-20: qty 32

## 2018-09-20 NOTE — Telephone Encounter (Signed)
Letter pended, please review.

## 2018-09-21 NOTE — Telephone Encounter (Signed)
Patient called, are you ok to sign off on this note for her to return to work tomorrow?

## 2018-09-21 NOTE — Telephone Encounter (Signed)
Ok for note 

## 2018-09-28 ENCOUNTER — Ambulatory Visit: Payer: 59

## 2018-10-03 ENCOUNTER — Ambulatory Visit (INDEPENDENT_AMBULATORY_CARE_PROVIDER_SITE_OTHER): Payer: 59 | Admitting: Sports Medicine

## 2018-10-03 ENCOUNTER — Other Ambulatory Visit: Payer: Self-pay

## 2018-10-03 VITALS — BP 118/55 | HR 65 | Wt 171.0 lb

## 2018-10-03 DIAGNOSIS — E538 Deficiency of other specified B group vitamins: Secondary | ICD-10-CM

## 2018-10-03 MED ORDER — CYANOCOBALAMIN 1000 MCG/ML IJ SOLN
1000.0000 ug | Freq: Once | INTRAMUSCULAR | Status: AC
  Administered 2018-10-03: 1000 ug via INTRAMUSCULAR

## 2018-10-03 NOTE — Progress Notes (Signed)
Established Patient Office Visit  Subjective:  Patient ID: Sara Wolfe, female    DOB: August 17, 1960  Age: 58 y.o. MRN: [id number]  CC:  Chief Complaint  Patient presents with  . Pernicious Anemia    HPI Sara Wolfe is here for a vitamin B 12 injection. Denies muscle cramps, weakness or irregular heart rate.   Past Medical History:  Diagnosis Date  . Gallstones   . GERD (gastroesophageal reflux disease)    history of prior to gastric bypass  . Hot flashes, menopausal   . Medical history non-contributory   . Migraine   . OSA on CPAP   . PONV (postoperative nausea and vomiting)   . Rectocele 02/13/2018  . Ventral hernia 2018   Small, noted on CT Abd/pel  . Vitamin B 12 deficiency     Past Surgical History:  Procedure Laterality Date  . ANTERIOR CERVICAL DECOMP/DISCECTOMY FUSION N/A 10/04/2013   Procedure: ANTERIOR CERVICAL DECOMPRESSION/DISCECTOMY FUSION 3 LEVELS;  Surgeon: Sinclair Ship, MD;  Location: [facility];  Service: Orthopedics;  Laterality: N/A;  Anterior cervical decompression fusion cervical 4-5, cervical 5-6, cervical 6-7 with instrumentation and allograft  . ANTERIOR CERVICAL DECOMP/DISCECTOMY FUSION N/A 04/11/2014   Procedure: ANTERIOR CERVICAL DECOMPRESSION/DISCECTOMY FUSION 1 LEVEL;  Surgeon: Sinclair Ship, MD;  Location: [facility];  Service: Orthopedics;  Laterality: N/A;  Anterior cervical decompression fusion, cervical 7-thoracic 1 with instrumentation and allograft  . CHOLECYSTECTOMY N/A 03/06/2018   Procedure: LAPAROSCOPIC CHOLECYSTECTOMY;  Surgeon: Greer Pickerel, MD;  Location: [facility];  Service: General;  Laterality: N/A;  . COLONOSCOPY  2017  . FACIAL COSMETIC SURGERY    . FOOT SURGERY Bilateral   . GASTRIC BYPASS    . NECK EXPLORATION  2014, 2015  . TUBAL LIGATION    . UPPER GI ENDOSCOPY  2017    Family History  Problem Relation Age of Onset  . Healthy Mother   . Healthy Father   . Colon cancer Sister     Social History   Socioeconomic  History  . Marital status: Married    Spouse name: Not on file  . Number of children: 2  . Years of education: 33  . Highest education level: Not on file  Occupational History  . Occupation: [employer]  Social Needs  . Financial resource strain: Not on file  . Food insecurity    Worry: Not on file    Inability: Not on file  . Transportation needs    Medical: Not on file    Non-medical: Not on file  Tobacco Use  . Smoking status: Light Tobacco Smoker    Packs/day: 0.25    Years: 2.00    Pack years: 0.50    Types: Cigarettes  . Smokeless tobacco: Former Systems developer    Quit date: 08/03/2018  Substance and Sexual Activity  . Alcohol use: No  . Drug use: No  . Sexual activity: Yes    Birth control/protection: Post-menopausal  Lifestyle  . Physical activity    Days per week: Not on file    Minutes per session: Not on file  . Stress: Not on file  Relationships  . Social Herbalist on phone: Not on file    Gets together: Not on file    Attends religious service: Not on file    Active member of club or organization: Not on file    Attends meetings of clubs or organizations: Not on file    Relationship status: Not on file  .  Intimate partner violence    Fear of current or ex partner: Not on file    Emotionally abused: Not on file    Physically abused: Not on file    Forced sexual activity: Not on file  Other Topics Concern  . Not on file  Social History Narrative   Lives with husband   Caffeine use: tea daily   Right handed     Outpatient Medications Prior to Visit  Medication Sig Dispense Refill  . AMBULATORY NON FORMULARY MEDICATION Continuous positive airway pressure (CPAP) machine autopap 5-21 cm of H2O pressure (or autoPAP if available), with all supplemental supplies as needed.  To [employer]. AHI=15 1 each 0  . amitriptyline (ELAVIL) 50 MG tablet TAKE 1/2 TABLET BY MOUTH AT BEDTIME FOR 7 DAYS THEN TAKE 1 TABLET AT BEDTIME 270 tablet 1  . bimatoprost (LATISSE) 0.03  % ophthalmic solution Place 1gtt on applicator and apply evenly along the skin of the upper eyelid at base of eyelashes qhs; use new applicator for 2nd eye (Patient taking differently: Place 1 drop into both eyes at bedtime. Place 1gtt on applicator and apply evenly along the skin of the upper eyelid at base of eyelashes qhs; use new applicator for 2nd eye) 5 mL 12  . DULoxetine (CYMBALTA) 60 MG capsule TAKE 1 CAPSULE BY MOUTH EVERY DAY 90 capsule 0  . folic acid (FOLVITE) 1 MG tablet TAKE 5 TABLETS (5 MG TOTAL) BY MOUTH DAILY. 90 tablet 3  . gabapentin (NEURONTIN) 800 MG tablet 1 tab in the morning, 1 tab midday, 2 tabs at bedtime 360 tablet 3  . methocarbamol (ROBAXIN) 500 MG tablet TAKE 1 TABLET BY MOUTH THREE TIMES A DAY 90 tablet 3  . nitroGLYCERIN (NITROSTAT) 0.4 MG SL tablet Place 1 tablet (0.4 mg total) under the tongue every 5 (five) minutes as needed for chest pain. 30 tablet 0  . rizatriptan (MAXALT-MLT) 10 MG disintegrating tablet TAKE 1 TABLET (10 MG TOTAL) BY MOUTH AS NEEDED FOR MIGRAINE. MAY REPEAT IN 2 HOURS IF NEEDED 10 tablet 3  . traMADol (ULTRAM) 50 MG tablet TAKE 1 TABLET BY MOUTH EVERY 8 HOURS AS NEEDED. FOR PAIN 90 tablet 0   No facility-administered medications prior to visit.     Allergies  Allergen Reactions  . Amoxicillin-Pot Clavulanate Diarrhea    ROS Review of Systems    Objective:    Physical Exam  BP (!) 118/55   Pulse 65   Wt 171 lb (77.6 kg)   SpO2 100%   BMI 30.29 kg/m  Wt Readings from Last 3 Encounters:  10/03/18 171 lb (77.6 kg)  09/20/18 172 lb (78 kg)  09/08/18 172 lb (78 kg)     Health Maintenance Due  Topic Date Due  . INFLUENZA VACCINE  09/23/2018    There are no preventive care reminders to display for this patient.  Lab Results  Component Value Date   TSH 1.20 06/12/2018   Lab Results  Component Value Date   WBC 7.6 09/08/2018   HGB 13.9 09/08/2018   HCT 41.2 09/08/2018   MCV 94.5 09/08/2018   PLT 259 09/08/2018    Lab Results  Component Value Date   NA 144 09/08/2018   K 3.4 (L) 09/08/2018   CO2 25 09/08/2018   GLUCOSE 87 09/08/2018   BUN 12 09/08/2018   CREATININE 0.63 09/08/2018   BILITOT 0.2 09/08/2018   ALKPHOS 69 03/03/2018   AST 15 09/08/2018   ALT 23 09/08/2018   PROT  5.6 (L) 09/08/2018   ALBUMIN 3.7 03/03/2018   CALCIUM 8.8 09/12/2018   ANIONGAP 7 03/03/2018   Lab Results  Component Value Date   CHOL 172 06/12/2018   Lab Results  Component Value Date   HDL 74 06/12/2018   Lab Results  Component Value Date   LDLCALC 84 06/12/2018   Lab Results  Component Value Date   TRIG 49 06/12/2018   Lab Results  Component Value Date   CHOLHDL 2.3 06/12/2018   Lab Results  Component Value Date   HGBA1C 4.8 06/12/2018      Assessment & Plan:  B12 injection - Patient tolerated injection well without complications. Patient advised to schedule next injection 30 days from today.    Problem List Items Addressed This Visit    Vitamin B 12 deficiency - Primary      Meds ordered this encounter  Medications  . cyanocobalamin ((VITAMIN B-12)) injection 1,000 mcg    Follow-up: Return in about 4 weeks (around 10/31/2018) for B12 injection. Durene Romans, Monico Blitz, [place]

## 2018-10-06 ENCOUNTER — Other Ambulatory Visit: Payer: Self-pay | Admitting: Sports Medicine

## 2018-10-06 DIAGNOSIS — M5416 Radiculopathy, lumbar region: Secondary | ICD-10-CM

## 2018-10-25 ENCOUNTER — Other Ambulatory Visit: Payer: Self-pay | Admitting: Sports Medicine

## 2018-10-25 DIAGNOSIS — Z981 Arthrodesis status: Secondary | ICD-10-CM

## 2018-10-27 ENCOUNTER — Other Ambulatory Visit: Payer: Self-pay | Admitting: Sports Medicine

## 2018-10-31 ENCOUNTER — Other Ambulatory Visit: Payer: Self-pay | Admitting: *Deleted

## 2018-10-31 DIAGNOSIS — H02729 Madarosis of unspecified eye, unspecified eyelid and periocular area: Secondary | ICD-10-CM

## 2018-10-31 MED ORDER — BIMATOPROST 0.03 % EX SOLN: CUTANEOUS | 12 refills | Status: DC

## 2018-11-01 ENCOUNTER — Telehealth: Payer: Self-pay | Admitting: Sports Medicine

## 2018-11-01 NOTE — Telephone Encounter (Signed)
Received fax from [employer] that [employer] requires a PA. Information has been sent to the insurance company. Awaiting determination.   

## 2018-11-02 NOTE — Telephone Encounter (Signed)
The patient is aware and did not have any questions.

## 2018-11-02 NOTE — Telephone Encounter (Signed)
Not covered and alternatives not acceptable, she will just have to accept eyelashes growing at a normal rate.

## 2018-11-02 NOTE — Telephone Encounter (Signed)
I received a fax back from insurance that this medication is a plan exclusion and an appeal is not offered for this medication.   The preferred alteratives are: latanoprost, Lumigan, or Travatan. Please advise.

## 2018-11-03 ENCOUNTER — Ambulatory Visit: Payer: 59

## 2018-11-08 ENCOUNTER — Encounter: Payer: Self-pay | Admitting: Neurology

## 2018-11-08 ENCOUNTER — Telehealth: Payer: Self-pay | Admitting: Neurology

## 2018-11-08 ENCOUNTER — Institutional Professional Consult (permissible substitution): Payer: 59 | Admitting: Neurology

## 2018-11-08 ENCOUNTER — Ambulatory Visit: Payer: 59 | Admitting: Sports Medicine

## 2018-11-08 ENCOUNTER — Other Ambulatory Visit: Payer: Self-pay

## 2018-11-08 DIAGNOSIS — E538 Deficiency of other specified B group vitamins: Secondary | ICD-10-CM | POA: Diagnosis not present

## 2018-11-08 DIAGNOSIS — Z23 Encounter for immunization: Secondary | ICD-10-CM

## 2018-11-08 DIAGNOSIS — Z9889 Other specified postprocedural states: Secondary | ICD-10-CM

## 2018-11-08 MED ORDER — CYANOCOBALAMIN 1000 MCG/ML IJ SOLN
1000.0000 ug | Freq: Once | INTRAMUSCULAR | Status: AC
  Administered 2018-11-08: 1000 ug via INTRAMUSCULAR

## 2018-11-08 NOTE — Addendum Note (Signed)
Addended by: Jamesetta So on: 11/08/2018 02:33 PM   Modules accepted: Orders

## 2018-11-08 NOTE — Assessment & Plan Note (Signed)
Sara Wolfe is approximately 3 weeks post abdominoplasty, diastases recti repair, breast lift back on August 25 in [place]. Doing well, sutures were removed today from her umbilical incision, #8 sutures. Follow-up as needed.

## 2018-11-08 NOTE — Progress Notes (Signed)
Subjective:    CC: Follow-up  HPI: Sara Wolfe went to [place] to have an abdominoplasty, diastases recti repair, breast lift approximately 3 weeks ago, she is doing well, she needs her sutures removed.  She did have significant excess skin post sleeve gastrectomy.  I reviewed the past medical history, family history, social history, surgical history, and allergies today and no changes were needed.  Please see the problem list section below in epic for further details.  Past Medical History: Past Medical History:  Diagnosis Date  . Gallstones   . GERD (gastroesophageal reflux disease)    history of prior to gastric bypass  . Hot flashes, menopausal   . Medical history non-contributory   . Migraine   . OSA on CPAP   . PONV (postoperative nausea and vomiting)   . Rectocele 02/13/2018  . Ventral hernia 2018   Small, noted on CT Abd/pel  . Vitamin B 12 deficiency    Past Surgical History: Past Surgical History:  Procedure Laterality Date  . ANTERIOR CERVICAL DECOMP/DISCECTOMY FUSION N/A 10/04/2013   Procedure: ANTERIOR CERVICAL DECOMPRESSION/DISCECTOMY FUSION 3 LEVELS;  Surgeon: Sinclair Ship, MD;  Location: [facility];  Service: Orthopedics;  Laterality: N/A;  Anterior cervical decompression fusion cervical 4-5, cervical 5-6, cervical 6-7 with instrumentation and allograft  . ANTERIOR CERVICAL DECOMP/DISCECTOMY FUSION N/A 04/11/2014   Procedure: ANTERIOR CERVICAL DECOMPRESSION/DISCECTOMY FUSION 1 LEVEL;  Surgeon: Sinclair Ship, MD;  Location: [facility];  Service: Orthopedics;  Laterality: N/A;  Anterior cervical decompression fusion, cervical 7-thoracic 1 with instrumentation and allograft  . CHOLECYSTECTOMY N/A 03/06/2018   Procedure: LAPAROSCOPIC CHOLECYSTECTOMY;  Surgeon: Greer Pickerel, MD;  [facility];  Service: General;  Laterality: N/A;  . COLONOSCOPY  2017  . FACIAL COSMETIC SURGERY    . FOOT SURGERY Bilateral   . GASTRIC BYPASS    . NECK EXPLORATION  2014, 2015  .  TUBAL LIGATION    . UPPER GI ENDOSCOPY  2017   Social History: Social History   Socioeconomic History  . Marital status: Married    Spouse name: Not on file  . Number of children: 2  . Years of education: 64  . Highest education level: Not on file  Occupational History  . Occupation: [employer]  Social Needs  . Financial resource strain: Not on file  . Food insecurity    Worry: Not on file    Inability: Not on file  . Transportation needs    Medical: Not on file    Non-medical: Not on file  Tobacco Use  . Smoking status: Light Tobacco Smoker    Packs/day: 0.25    Years: 2.00    Pack years: 0.50    Types: Cigarettes  . Smokeless tobacco: Former Systems developer    Quit date: 08/03/2018  Substance and Sexual Activity  . Alcohol use: No  . Drug use: No  . Sexual activity: Yes    Birth control/protection: Post-menopausal  Lifestyle  . Physical activity    Days per week: Not on file    Minutes per session: Not on file  . Stress: Not on file  Relationships  . Social Herbalist on phone: Not on file    Gets together: Not on file    Attends religious service: Not on file    Active member of club or organization: Not on file    Attends meetings of clubs or organizations: Not on file    Relationship status: Not on file  Other Topics Concern  .  Not on file  Social History Narrative   Lives with husband   Caffeine use: tea daily   Right handed    Family History: Family History  Problem Relation Age of Onset  . Healthy Mother   . Healthy Father   . Colon cancer Sister    Allergies: Allergies  Allergen Reactions  . Amoxicillin-Pot Clavulanate Diarrhea   Medications: See med rec.  Review of Systems: No fevers, chills, night sweats, weight loss, chest pain, or shortness of breath.   Objective:    General: Well Developed, well nourished, and in no acute distress.  Neuro: Alert and oriented x3, extra-ocular muscles intact, sensation grossly intact.  HEENT:  Normocephalic, atraumatic, pupils equal round reactive to light, neck supple, no masses, no lymphadenopathy, thyroid nonpalpable.  Skin: Warm and dry, no rashes. Cardiac: Regular rate and rhythm, no murmurs rubs or gallops, no lower extremity edema.  Respiratory: Clear to auscultation bilaterally. Not using accessory muscles, speaking in full sentences. Abdomen: Incision looks good, #8 simple interrupted Prolene sutures removed from the umbilical site.  Impression and Recommendations:    H/O abdominoplasty Sara Wolfe is approximately 3 weeks post abdominoplasty, diastases recti repair, breast lift back on August 25 in [place]. Doing well, sutures were removed today from her umbilical incision, #8 sutures. Follow-up as needed.   ___________________________________________ Gwen Her. [name], M.D., ABFM., CAQSM. Primary Care and Sports Medicine [facility]  Adjunct Professor of [employer] [facility] [employer]

## 2018-11-08 NOTE — Telephone Encounter (Signed)
This patient did not show for new patient appointment today.

## 2018-11-13 ENCOUNTER — Other Ambulatory Visit: Payer: Self-pay | Admitting: Sports Medicine

## 2018-11-13 DIAGNOSIS — M5416 Radiculopathy, lumbar region: Secondary | ICD-10-CM

## 2018-12-06 ENCOUNTER — Ambulatory Visit: Payer: 59

## 2018-12-12 ENCOUNTER — Ambulatory Visit (INDEPENDENT_AMBULATORY_CARE_PROVIDER_SITE_OTHER): Payer: 59 | Admitting: Sports Medicine

## 2018-12-12 ENCOUNTER — Other Ambulatory Visit: Payer: Self-pay

## 2018-12-12 VITALS — BP 124/70 | HR 95

## 2018-12-12 DIAGNOSIS — E538 Deficiency of other specified B group vitamins: Secondary | ICD-10-CM

## 2018-12-12 MED ORDER — CYANOCOBALAMIN 1000 MCG/ML IJ SOLN
1000.0000 ug | Freq: Once | INTRAMUSCULAR | Status: AC
  Administered 2018-12-12: 1000 ug via INTRAMUSCULAR

## 2018-12-12 NOTE — Progress Notes (Signed)
Pt came into clinic today for B12 injection. Denies chest pain, palpitations, negative side effects. Tolerated injection in left deltoid well, no immediate complications. Advised to follow up in 4 weeks for next injection.

## 2018-12-14 ENCOUNTER — Other Ambulatory Visit: Payer: Self-pay | Admitting: Sports Medicine

## 2018-12-14 DIAGNOSIS — M5416 Radiculopathy, lumbar region: Secondary | ICD-10-CM

## 2018-12-28 ENCOUNTER — Other Ambulatory Visit: Payer: Self-pay | Admitting: Sports Medicine

## 2018-12-28 DIAGNOSIS — Z981 Arthrodesis status: Secondary | ICD-10-CM

## 2019-01-08 ENCOUNTER — Encounter: Payer: Self-pay | Admitting: Sports Medicine

## 2019-01-08 ENCOUNTER — Other Ambulatory Visit: Payer: Self-pay

## 2019-01-08 ENCOUNTER — Ambulatory Visit (INDEPENDENT_AMBULATORY_CARE_PROVIDER_SITE_OTHER): Payer: 59 | Admitting: Sports Medicine

## 2019-01-08 VITALS — BP 125/82 | HR 97 | Ht 63.0 in | Wt 186.0 lb

## 2019-01-08 DIAGNOSIS — Z981 Arthrodesis status: Secondary | ICD-10-CM

## 2019-01-08 DIAGNOSIS — E538 Deficiency of other specified B group vitamins: Secondary | ICD-10-CM | POA: Diagnosis not present

## 2019-01-08 DIAGNOSIS — Z903 Acquired absence of stomach [part of]: Secondary | ICD-10-CM | POA: Diagnosis not present

## 2019-01-08 MED ORDER — CYANOCOBALAMIN 1000 MCG/ML IJ SOLN
1000.0000 ug | Freq: Once | INTRAMUSCULAR | Status: AC
  Administered 2019-01-08: 1000 ug via INTRAMUSCULAR

## 2019-01-08 NOTE — Assessment & Plan Note (Signed)
Starting to 251 pounds, now down to 180, good overall weight loss but I do think that we are robbing [facility] to pay Eddie Dibbles here, she is on several medications that can result in weight gain. Discontinue gabapentin, she will continue Lyrica for now she gets good pain relief with the Lyrica is significantly weight positive. She is taking Cymbalta for hot flashes, we will down taper this, she has tried hormonal replacement in the past but ended up with thickening of the endometrial stripe. Certainly if she could have a hysterectomy we could go back to hormonal supplement. We are also going to stop amitriptyline. Return in 3 months to reevaluate weight.

## 2019-01-08 NOTE — Patient Instructions (Signed)
Drop Cymbalta to a 30 mg capsule for a week and then stop. Stop amitriptyline, stop gabapentin.

## 2019-01-08 NOTE — Progress Notes (Signed)
Subjective:    CC: Weight gain  HPI: Sara Wolfe is a very pleasant 58 year old female, she is post sleeve gastrectomy, she went from 250 pounds down to 180 today.  She has been lower, but has great difficulty keeping the weight off, she is on several medications that can interfere with her weight loss.  I reviewed the past medical history, family history, social history, surgical history, and allergies today and no changes were needed.  Please see the problem list section below in epic for further details.  Past Medical History: Past Medical History:  Diagnosis Date  . Gallstones   . GERD (gastroesophageal reflux disease)    history of prior to gastric bypass  . Hot flashes, menopausal   . Medical history non-contributory   . Migraine   . OSA on CPAP   . PONV (postoperative nausea and vomiting)   . Rectocele 02/13/2018  . Ventral hernia 2018   Small, noted on CT Abd/pel  . Vitamin B 12 deficiency    Past Surgical History: Past Surgical History:  Procedure Laterality Date  . ANTERIOR CERVICAL DECOMP/DISCECTOMY FUSION N/A 10/04/2013   Procedure: ANTERIOR CERVICAL DECOMPRESSION/DISCECTOMY FUSION 3 LEVELS;  Surgeon: Sinclair Ship, MD;  Location: [facility];  Service: Orthopedics;  Laterality: N/A;  Anterior cervical decompression fusion cervical 4-5, cervical 5-6, cervical 6-7 with instrumentation and allograft  . ANTERIOR CERVICAL DECOMP/DISCECTOMY FUSION N/A 04/11/2014   Procedure: ANTERIOR CERVICAL DECOMPRESSION/DISCECTOMY FUSION 1 LEVEL;  Surgeon: Sinclair Ship, MD;  Location: [facility];  Service: Orthopedics;  Laterality: N/A;  Anterior cervical decompression fusion, cervical 7-thoracic 1 with instrumentation and allograft  . CHOLECYSTECTOMY N/A 03/06/2018   Procedure: LAPAROSCOPIC CHOLECYSTECTOMY;  Surgeon: Greer Pickerel, MD;  Location: [facility];  Service: General;  Laterality: N/A;  . COLONOSCOPY  2017  . FACIAL COSMETIC SURGERY    . FOOT SURGERY Bilateral   . GASTRIC BYPASS     . NECK EXPLORATION  2014, 2015  . TUBAL LIGATION    . UPPER GI ENDOSCOPY  2017   Social History: Social History   Socioeconomic History  . Marital status: Married    Spouse name: Not on file  . Number of children: 2  . Years of education: 73  . Highest education level: Not on file  Occupational History  . Occupation: [employer]  Social Needs  . Financial resource strain: Not on file  . Food insecurity    Worry: Not on file    Inability: Not on file  . Transportation needs    Medical: Not on file    Non-medical: Not on file  Tobacco Use  . Smoking status: Light Tobacco Smoker    Packs/day: 0.25    Years: 2.00    Pack years: 0.50    Types: Cigarettes  . Smokeless tobacco: Former Systems developer    Quit date: 08/03/2018  Substance and Sexual Activity  . Alcohol use: No  . Drug use: No  . Sexual activity: Yes    Birth control/protection: Post-menopausal  Lifestyle  . Physical activity    Days per week: Not on file    Minutes per session: Not on file  . Stress: Not on file  Relationships  . Social Herbalist on phone: Not on file    Gets together: Not on file    Attends religious service: Not on file    Active member of club or organization: Not on file    Attends meetings of clubs or organizations: Not on file  Relationship status: Not on file  Other Topics Concern  . Not on file  Social History Narrative   Lives with husband   Caffeine use: tea daily   Right handed    Family History: Family History  Problem Relation Age of Onset  . Healthy Mother   . Healthy Father   . Colon cancer Sister    Allergies: Allergies  Allergen Reactions  . Amoxicillin-Pot Clavulanate Diarrhea   Medications: See med rec.  Review of Systems: No fevers, chills, night sweats, weight loss, chest pain, or shortness of breath.   Objective:    General: Well Developed, well nourished, and in no acute distress.  Neuro: Alert and oriented x3, extra-ocular muscles intact,  sensation grossly intact.  HEENT: Normocephalic, atraumatic, pupils equal round reactive to light, neck supple, no masses, no lymphadenopathy, thyroid nonpalpable.  Skin: Warm and dry, no rashes. Cardiac: Regular rate and rhythm, no murmurs rubs or gallops, no lower extremity edema.  Respiratory: Clear to auscultation bilaterally. Not using accessory muscles, speaking in full sentences.  Impression and Recommendations:    History of sleeve gastrectomy Starting to 251 pounds, now down to 180, good overall weight loss but I do think that we are robbing [facility] to pay Eddie Dibbles here, she is on several medications that can result in weight gain. Discontinue gabapentin, she will continue Lyrica for now she gets good pain relief with the Lyrica is significantly weight positive. She is taking Cymbalta for hot flashes, we will down taper this, she has tried hormonal replacement in the past but ended up with thickening of the endometrial stripe. Certainly if she could have a hysterectomy we could go back to hormonal supplement. We are also going to stop amitriptyline. Return in 3 months to reevaluate weight.  I spent 25 minutes with this patient, greater than 50% was face-to-face time counseling regarding the above diagnoses.  ___________________________________________ Gwen Her. [name], M.D., ABFM., CAQSM. Primary Care and Sports Medicine [facility]  Adjunct Professor of [place] [facility] [employer]

## 2019-01-17 ENCOUNTER — Other Ambulatory Visit: Payer: Self-pay | Admitting: Sports Medicine

## 2019-01-17 DIAGNOSIS — M5416 Radiculopathy, lumbar region: Secondary | ICD-10-CM

## 2019-01-22 ENCOUNTER — Ambulatory Visit (INDEPENDENT_AMBULATORY_CARE_PROVIDER_SITE_OTHER): Payer: 59 | Admitting: Sports Medicine

## 2019-01-22 ENCOUNTER — Ambulatory Visit (INDEPENDENT_AMBULATORY_CARE_PROVIDER_SITE_OTHER): Payer: 59

## 2019-01-22 ENCOUNTER — Other Ambulatory Visit: Payer: Self-pay

## 2019-01-22 ENCOUNTER — Encounter: Payer: Self-pay | Admitting: Sports Medicine

## 2019-01-22 DIAGNOSIS — S2231XA Fracture of one rib, right side, initial encounter for closed fracture: Secondary | ICD-10-CM | POA: Diagnosis not present

## 2019-01-22 DIAGNOSIS — R7989 Other specified abnormal findings of blood chemistry: Secondary | ICD-10-CM | POA: Diagnosis not present

## 2019-01-22 DIAGNOSIS — M810 Age-related osteoporosis without current pathological fracture: Secondary | ICD-10-CM

## 2019-01-22 DIAGNOSIS — R1011 Right upper quadrant pain: Secondary | ICD-10-CM | POA: Diagnosis not present

## 2019-01-22 MED ORDER — HYDROCODONE-ACETAMINOPHEN 10-325 MG PO TABS: 1.0000 | ORAL_TABLET | Freq: Three times a day (TID) | ORAL | 0 refills | Status: DC | PRN

## 2019-01-22 NOTE — Assessment & Plan Note (Addendum)
History of cholecystectomy, has seen her bariatric surgeon in the past and they suspected it was dyspepsia/peptic ulcer disease. Pain today is mostly over the costochondral junction on the right, adding x-rays, hydrocodone, liver function tests. Chest was strapped with a compressive dressing. Return to see me in 2 weeks.  Surprising but there is a sixth rib fracture without trauma, adding a bone density test, we will probably do a CT of the chest to make sure there is no metastatic focus here.

## 2019-01-22 NOTE — Progress Notes (Addendum)
Subjective:    CC: Right upper quadrant pain  HPI: Sara Wolfe returns, she is a 58 year old female, she is post cholecystectomy, for the past couple weeks she has had pain in her right upper quadrant, over the costochondral junction on the right, worse with sitting up from a seated position, no GI symptoms, no bowel or bladder dysfunction, no constitutional symptoms.  No trauma, no change in physical activity level.  I reviewed the past medical history, family history, social history, surgical history, and allergies today and no changes were needed.  Please see the problem list section below in epic for further details.  Past Medical History: Past Medical History:  Diagnosis Date   Gallstones    GERD (gastroesophageal reflux disease)    history of prior to gastric bypass   Hot flashes, menopausal    Medical history non-contributory    Migraine    OSA on CPAP    PONV (postoperative nausea and vomiting)    Rectocele 02/13/2018   Ventral hernia 2018   Small, noted on CT Abd/pel   Vitamin B 12 deficiency    Past Surgical History: Past Surgical History:  Procedure Laterality Date   ANTERIOR CERVICAL DECOMP/DISCECTOMY FUSION N/A 10/04/2013   Procedure: ANTERIOR CERVICAL DECOMPRESSION/DISCECTOMY FUSION 3 LEVELS;  Surgeon: Sinclair Ship, MD;  Location: [facility];  Service: Orthopedics;  Laterality: N/A;  Anterior cervical decompression fusion cervical 4-5, cervical 5-6, cervical 6-7 with instrumentation and allograft   ANTERIOR CERVICAL DECOMP/DISCECTOMY FUSION N/A 04/11/2014   Procedure: ANTERIOR CERVICAL DECOMPRESSION/DISCECTOMY FUSION 1 LEVEL;  Surgeon: Sinclair Ship, MD;  Location: [facility];  Service: Orthopedics;  Laterality: N/A;  Anterior cervical decompression fusion, cervical 7-thoracic 1 with instrumentation and allograft   CHOLECYSTECTOMY N/A 03/06/2018   Procedure: LAPAROSCOPIC CHOLECYSTECTOMY;  Surgeon: Greer Pickerel, MD;  Location: [facility];  Service: General;   Laterality: N/A;   COLONOSCOPY  2017   FACIAL COSMETIC SURGERY     FOOT SURGERY Bilateral    GASTRIC BYPASS     NECK EXPLORATION  2014, 2015   TUBAL LIGATION     UPPER GI ENDOSCOPY  2017   Social History: Social History   Socioeconomic History   Marital status: Married    Spouse name: Not on file   Number of children: 2   Years of education: 12   Highest education level: Not on file  Occupational History   Occupation: [employer]  Scientist, product/process development strain: Not on file   Food insecurity    Worry: Not on file    Inability: Not on file   Transportation needs    Medical: Not on file    Non-medical: Not on file  Tobacco Use   Smoking status: Light Tobacco Smoker    Packs/day: 0.25    Years: 2.00    Pack years: 0.50    Types: Cigarettes   Smokeless tobacco: Former Systems developer    Quit date: 08/03/2018  Substance and Sexual Activity   Alcohol use: No   Drug use: No   Sexual activity: Yes    Birth control/protection: Post-menopausal  Lifestyle   Physical activity    Days per week: Not on file    Minutes per session: Not on file   Stress: Not on file  Relationships   Social connections    Talks on phone: Not on file    Gets together: Not on file    Attends religious service: Not on file    Active member of club or organization:  Not on file    Attends meetings of clubs or organizations: Not on file    Relationship status: Not on file  Other Topics Concern   Not on file  Social History Narrative   Lives with husband   Caffeine use: tea daily   Right handed    Family History: Family History  Problem Relation Age of Onset   Healthy Mother    Healthy Father    Colon cancer Sister    Allergies: Allergies  Allergen Reactions   Amoxicillin-Pot Clavulanate Diarrhea   Medications: See med rec.  Review of Systems: No fevers, chills, night sweats, weight loss, chest pain, or shortness of breath.   Objective:    General: Well  Developed, well nourished, and in no acute distress.  Neuro: Alert and oriented x3, extra-ocular muscles intact, sensation grossly intact.  HEENT: Normocephalic, atraumatic, pupils equal round reactive to light, neck supple, no masses, no lymphadenopathy, thyroid nonpalpable.  Skin: Warm and dry, no rashes. Cardiac: Regular rate and rhythm, no murmurs rubs or gallops, no lower extremity edema.  Respiratory: Clear to auscultation bilaterally. Not using accessory muscles, speaking in full sentences. Abdomen: Soft, tender to palpation in the right upper quadrant, exquisite tenderness over the cartilaginous costal margin, no rebound tenderness, no guarding, no rigidity.  Impression and Recommendations:    Fracture of one rib, right side, initial encounter for closed fracture History of cholecystectomy, has seen her bariatric surgeon in the past and they suspected it was dyspepsia/peptic ulcer disease. Pain today is mostly over the costochondral junction on the right, adding x-rays, hydrocodone, liver function tests. Chest was strapped with a compressive dressing. Return to see me in 2 weeks.  Surprising but there is a sixth rib fracture without trauma, adding a bone density test, we will probably do a CT of the chest to make sure there is no metastatic focus here.  D-dimer, elevated With chest pain possibly coming from the rib fracture but because there was some areas of lucency I do think we need advanced imaging in the form of a CT angiogram of the pulmonary arteries. Historically there has been some evidence of pulmonary edema so we will add a BNP as well.   ___________________________________________ Gwen Her. [name], M.D., ABFM., CAQSM. Primary Care and Sports Medicine [facility]  Adjunct Professor of [place] of [facility] of Medicine

## 2019-01-22 NOTE — Addendum Note (Signed)
Addended by: Silverio Decamp on: 01/22/2019 04:47 PM   Modules accepted: Orders

## 2019-01-23 ENCOUNTER — Ambulatory Visit (INDEPENDENT_AMBULATORY_CARE_PROVIDER_SITE_OTHER): Payer: 59

## 2019-01-23 DIAGNOSIS — R7989 Other specified abnormal findings of blood chemistry: Secondary | ICD-10-CM

## 2019-01-23 LAB — AMYLASE: Amylase: 42 U/L (ref 21–101)

## 2019-01-23 LAB — COMPLETE METABOLIC PANEL WITH GFR
AG Ratio: 1.9 (calc) (ref 1.0–2.5)
ALT: 15 U/L (ref 6–29)
AST: 18 U/L (ref 10–35)
Albumin: 3.7 g/dL (ref 3.6–5.1)
Alkaline phosphatase (APISO): 74 U/L (ref 37–153)
BUN: 14 mg/dL (ref 7–25)
CO2: 21 mmol/L (ref 20–32)
Calcium: 8.2 mg/dL — ABNORMAL LOW (ref 8.6–10.4)
Chloride: 112 mmol/L — ABNORMAL HIGH (ref 98–110)
Creat: 0.53 mg/dL (ref 0.50–0.99)
GFR, Est African American: 120 mL/min/{1.73_m2} (ref >=60)
GFR, Est Non African American: 103 mL/min/{1.73_m2} (ref >=60)
Globulin: 2 g/dL (calc) (ref 1.9–3.7)
Glucose, Bld: 83 mg/dL (ref 65–139)
Potassium: 3.5 mmol/L (ref 3.5–5.3)
Sodium: 145 mmol/L (ref 135–146)
Total Bilirubin: 0.3 mg/dL (ref 0.2–1.2)
Total Protein: 5.7 g/dL — ABNORMAL LOW (ref 6.1–8.1)

## 2019-01-23 LAB — CBC WITH DIFFERENTIAL/PLATELET
Absolute Monocytes: 855 cells/uL (ref 200–950)
Basophils Absolute: 103 cells/uL (ref 0–200)
Basophils Relative: 1.1 %
Eosinophils Absolute: 160 cells/uL (ref 15–500)
Eosinophils Relative: 1.7 %
HCT: 38.9 % (ref 35.0–45.0)
Hemoglobin: 13 g/dL (ref 11.7–15.5)
Lymphs Abs: 2049 cells/uL (ref 850–3900)
MCH: 30.2 pg (ref 27.0–33.0)
MCHC: 33.4 g/dL (ref 32.0–36.0)
MCV: 90.3 fL (ref 80.0–100.0)
MPV: 11.2 fL (ref 7.5–12.5)
Monocytes Relative: 9.1 %
Neutro Abs: 6232 cells/uL (ref 1500–7800)
Neutrophils Relative %: 66.3 %
Platelets: 231 10*3/uL (ref 140–400)
RBC: 4.31 10*6/uL (ref 3.80–5.10)
RDW: 13.1 % (ref 11.0–15.0)
Total Lymphocyte: 21.8 %
WBC: 9.4 10*3/uL (ref 3.8–10.8)

## 2019-01-23 LAB — URINALYSIS W MICROSCOPIC + REFLEX CULTURE
Bacteria, UA: NONE SEEN /HPF
Bilirubin Urine: NEGATIVE
Glucose, UA: NEGATIVE
Hgb urine dipstick: NEGATIVE
Hyaline Cast: NONE SEEN /LPF
Ketones, ur: NEGATIVE
Leukocyte Esterase: NEGATIVE
Nitrites, Initial: NEGATIVE
Protein, ur: NEGATIVE
RBC / HPF: NONE SEEN /HPF (ref 0–2)
Specific Gravity, Urine: 1.01 (ref 1.001–1.03)
Squamous Epithelial / HPF: NONE SEEN /HPF (ref <=5)
WBC, UA: NONE SEEN /HPF (ref 0–5)
pH: 6.5 (ref 5.0–8.0)

## 2019-01-23 LAB — D-DIMER, QUANTITATIVE: D-Dimer, Quant: 0.61 mcg/mL FEU — ABNORMAL HIGH (ref <0.50)

## 2019-01-23 LAB — LIPASE: Lipase: 33 U/L (ref 7–60)

## 2019-01-23 LAB — NO CULTURE INDICATED

## 2019-01-23 MED ORDER — IOPAMIDOL (ISOVUE-370) INJECTION 76%
100.0000 mL | Freq: Once | INTRAVENOUS | Status: AC | PRN
  Administered 2019-01-23: 100 mL via INTRAVENOUS

## 2019-01-23 NOTE — Assessment & Plan Note (Signed)
With chest pain possibly coming from the rib fracture but because there was some areas of lucency I do think we need advanced imaging in the form of a CT angiogram of the pulmonary arteries. Historically there has been some evidence of pulmonary edema so we will add a BNP as well.

## 2019-01-23 NOTE — Addendum Note (Signed)
Addended by: Silverio Decamp on: 01/23/2019 09:07 AM   Modules accepted: Orders

## 2019-01-25 ENCOUNTER — Encounter: Payer: Self-pay | Admitting: Sports Medicine

## 2019-01-26 ENCOUNTER — Encounter: Payer: Self-pay | Admitting: Sports Medicine

## 2019-01-26 DIAGNOSIS — R1011 Right upper quadrant pain: Secondary | ICD-10-CM

## 2019-01-26 MED ORDER — HYDROCODONE-ACETAMINOPHEN 10-325 MG PO TABS: 1.0000 | ORAL_TABLET | Freq: Three times a day (TID) | ORAL | 0 refills | Status: DC | PRN

## 2019-01-31 ENCOUNTER — Encounter: Payer: Self-pay | Admitting: Sports Medicine

## 2019-01-31 ENCOUNTER — Other Ambulatory Visit: Payer: Self-pay

## 2019-01-31 ENCOUNTER — Ambulatory Visit (INDEPENDENT_AMBULATORY_CARE_PROVIDER_SITE_OTHER): Payer: 59

## 2019-01-31 DIAGNOSIS — M810 Age-related osteoporosis without current pathological fracture: Secondary | ICD-10-CM | POA: Insufficient documentation

## 2019-01-31 DIAGNOSIS — S2231XA Fracture of one rib, right side, initial encounter for closed fracture: Secondary | ICD-10-CM

## 2019-01-31 DIAGNOSIS — R1011 Right upper quadrant pain: Secondary | ICD-10-CM | POA: Diagnosis not present

## 2019-01-31 MED ORDER — ALENDRONATE SODIUM 70 MG PO TABS: 70.0000 mg | ORAL_TABLET | ORAL | 11 refills | Status: DC

## 2019-01-31 NOTE — Addendum Note (Signed)
Addended by: Silverio Decamp on: 01/31/2019 05:16 PM   Modules accepted: Orders

## 2019-01-31 NOTE — Assessment & Plan Note (Signed)
Adding Fosamax.

## 2019-02-01 ENCOUNTER — Encounter: Payer: Self-pay | Admitting: Sports Medicine

## 2019-02-01 NOTE — Telephone Encounter (Signed)
Yes, the 21st is fine per her second Scientist, research (medical).

## 2019-02-05 ENCOUNTER — Other Ambulatory Visit: Payer: Self-pay

## 2019-02-05 ENCOUNTER — Encounter: Payer: Self-pay | Admitting: Sports Medicine

## 2019-02-05 ENCOUNTER — Ambulatory Visit: Payer: 59 | Admitting: Sports Medicine

## 2019-02-05 VITALS — BP 111/73 | HR 87 | Ht 63.0 in | Wt 186.0 lb

## 2019-02-05 DIAGNOSIS — E538 Deficiency of other specified B group vitamins: Secondary | ICD-10-CM

## 2019-02-05 DIAGNOSIS — M940 Chondrocostal junction syndrome [Tietze]: Secondary | ICD-10-CM

## 2019-02-05 DIAGNOSIS — M810 Age-related osteoporosis without current pathological fracture: Secondary | ICD-10-CM

## 2019-02-05 MED ORDER — CYANOCOBALAMIN 1000 MCG/ML IJ SOLN
1000.0000 ug | Freq: Once | INTRAMUSCULAR | Status: AC
  Administered 2019-02-05: 1000 ug via INTRAMUSCULAR

## 2019-02-05 NOTE — Assessment & Plan Note (Signed)
Continue Fosamax, we can recheck bone density in 2 years.

## 2019-02-05 NOTE — Progress Notes (Signed)
Subjective:    CC: Follow-up  HPI: Chest wall pain: CT confirmed no acute or new rib fracture particularly in the area of concern, this is just a costochondral contusion, pain is improved considerably.  Vitamin B12 deficiency: Due for B12 shot today.  Osteoporosis: With history of an old healing third rib fracture from some time ago with no trauma we did obtain a bone density test, she has started her Fosamax.  I reviewed the past medical history, family history, social history, surgical history, and allergies today and no changes were needed.  Please see the problem list section below in epic for further details.  Past Medical History: Past Medical History:  Diagnosis Date  . Gallstones   . GERD (gastroesophageal reflux disease)    history of prior to gastric bypass  . Hot flashes, menopausal   . Medical history non-contributory   . Migraine   . OSA on CPAP   . PONV (postoperative nausea and vomiting)   . Rectocele 02/13/2018  . Ventral hernia 2018   Small, noted on CT Abd/pel  . Vitamin B 12 deficiency    Past Surgical History: Past Surgical History:  Procedure Laterality Date  . ANTERIOR CERVICAL DECOMP/DISCECTOMY FUSION N/A 10/04/2013   Procedure: ANTERIOR CERVICAL DECOMPRESSION/DISCECTOMY FUSION 3 LEVELS;  Surgeon: Sinclair Ship, MD;  Location: [facility];  Service: Orthopedics;  Laterality: N/A;  Anterior cervical decompression fusion cervical 4-5, cervical 5-6, cervical 6-7 with instrumentation and allograft  . ANTERIOR CERVICAL DECOMP/DISCECTOMY FUSION N/A 04/11/2014   Procedure: ANTERIOR CERVICAL DECOMPRESSION/DISCECTOMY FUSION 1 LEVEL;  Surgeon: Sinclair Ship, MD;  Location: [facility];  Service: Orthopedics;  Laterality: N/A;  Anterior cervical decompression fusion, cervical 7-thoracic 1 with instrumentation and allograft  . CHOLECYSTECTOMY N/A 03/06/2018   Procedure: LAPAROSCOPIC CHOLECYSTECTOMY;  Surgeon: Greer Pickerel, MD;  Location: [facility];  Service: General;   Laterality: N/A;  . COLONOSCOPY  2017  . FACIAL COSMETIC SURGERY    . FOOT SURGERY Bilateral   . GASTRIC BYPASS    . NECK EXPLORATION  2014, 2015  . TUBAL LIGATION    . UPPER GI ENDOSCOPY  2017   Social History: Social History   Socioeconomic History  . Marital status: Married    Spouse name: Not on file  . Number of children: 2  . Years of education: 74  . Highest education level: Not on file  Occupational History  . Occupation: [employer]  Tobacco Use  . Smoking status: Light Tobacco Smoker    Packs/day: 0.25    Years: 2.00    Pack years: 0.50    Types: Cigarettes  . Smokeless tobacco: Former Systems developer    Quit date: 08/03/2018  Substance and Sexual Activity  . Alcohol use: No  . Drug use: No  . Sexual activity: Yes    Birth control/protection: Post-menopausal  Other Topics Concern  . Not on file  Social History Narrative   Lives with husband   Caffeine use: tea daily   Right handed    Social Determinants of Health   Financial Resource Strain:   . Difficulty of Paying Living Expenses: Not on file  Food Insecurity:   . Worried About Charity fundraiser in the Last Year: Not on file  . Ran Out of Food in the Last Year: Not on file  Transportation Needs:   . Lack of Transportation (Medical): Not on file  . Lack of Transportation (Non-Medical): Not on file  Physical Activity:   . Days of Exercise per  Week: Not on file  . Minutes of Exercise per Session: Not on file  Stress:   . Feeling of Stress : Not on file  Social Connections:   . Frequency of Communication with Friends and Family: Not on file  . Frequency of Social Gatherings with Friends and Family: Not on file  . Attends Religious Services: Not on file  . Active Member of Clubs or Organizations: Not on file  . Attends Archivist Meetings: Not on file  . Marital Status: Not on file   Family History: Family History  Problem Relation Age of Onset  . Healthy Mother   . Healthy Father   . Colon  cancer Sister    Allergies: Allergies  Allergen Reactions  . Amoxicillin-Pot Clavulanate Diarrhea   Medications: See med rec.  Review of Systems: No fevers, chills, night sweats, weight loss, chest pain, or shortness of breath.   Objective:    General: Well Developed, well nourished, and in no acute distress.  Neuro: Alert and oriented x3, extra-ocular muscles intact, sensation grossly intact.  HEENT: Normocephalic, atraumatic, pupils equal round reactive to light, neck supple, no masses, no lymphadenopathy, thyroid nonpalpable.  Skin: Warm and dry, no rashes. Cardiac: Regular rate and rhythm, no murmurs rubs or gallops, no lower extremity edema.  Respiratory: Clear to auscultation bilaterally. Not using accessory muscles, speaking in full sentences.  Impression and Recommendations:    Costochondritis Right-sided costochondral contusion, x-ray was noted on x-ray but not seen on CT, at this point there is no further reason to hold her out of work, she may return to work on Monday.   Osteoporosis Continue Fosamax, we can recheck bone density in 2 years.  Vitamin B 12 deficiency B12 injection today.   ___________________________________________ Gwen Her. [name], M.D., ABFM., CAQSM. Primary Care and Sports Medicine [place] [place] [facility]  Adjunct Professor of [place] of [facility] of Medicine

## 2019-02-05 NOTE — Assessment & Plan Note (Signed)
B12 injection today 

## 2019-02-05 NOTE — Assessment & Plan Note (Signed)
Right-sided costochondral contusion, x-ray was noted on x-ray but not seen on CT, at this point there is no further reason to hold her out of work, she may return to work on Monday.

## 2019-02-12 ENCOUNTER — Ambulatory Visit: Payer: 59

## 2019-02-25 ENCOUNTER — Other Ambulatory Visit: Payer: Self-pay | Admitting: Sports Medicine

## 2019-02-25 DIAGNOSIS — M5416 Radiculopathy, lumbar region: Secondary | ICD-10-CM

## 2019-03-06 ENCOUNTER — Other Ambulatory Visit: Payer: Self-pay | Admitting: *Deleted

## 2019-03-06 MED ORDER — PREGABALIN 200 MG PO CAPS: ORAL_CAPSULE | ORAL | 1 refills | Status: DC

## 2019-03-08 ENCOUNTER — Other Ambulatory Visit: Payer: Self-pay

## 2019-03-08 ENCOUNTER — Ambulatory Visit (INDEPENDENT_AMBULATORY_CARE_PROVIDER_SITE_OTHER): Payer: 59 | Admitting: Sports Medicine

## 2019-03-08 VITALS — BP 111/58 | HR 68 | Ht 63.0 in | Wt 183.0 lb

## 2019-03-08 DIAGNOSIS — E538 Deficiency of other specified B group vitamins: Secondary | ICD-10-CM

## 2019-03-08 MED ORDER — CYANOCOBALAMIN 1000 MCG/ML IJ SOLN
1000.0000 ug | Freq: Once | INTRAMUSCULAR | Status: AC
  Administered 2019-03-08: 09:00:00 1000 ug via INTRAMUSCULAR

## 2019-03-08 NOTE — Progress Notes (Signed)
Patient presents to clinic for a B12 injeciton.Patient tolerated the injection well in her left deltoid with no immediate complications. She denies any weakness, muscle cramps or irregular heart rate. She was advised to schedule in 1 month for her next B12 injection.   I asked PCP when he would like to check her B12 levels since her last one was in 2018. He will check her levels at her next visit.

## 2019-04-01 ENCOUNTER — Other Ambulatory Visit: Payer: Self-pay | Admitting: Sports Medicine

## 2019-04-01 DIAGNOSIS — M5416 Radiculopathy, lumbar region: Secondary | ICD-10-CM

## 2019-04-09 ENCOUNTER — Ambulatory Visit: Payer: 59

## 2019-04-12 ENCOUNTER — Ambulatory Visit: Payer: 59

## 2019-04-27 ENCOUNTER — Other Ambulatory Visit: Payer: Self-pay | Admitting: Sports Medicine

## 2019-05-01 ENCOUNTER — Other Ambulatory Visit: Payer: Self-pay | Admitting: Sports Medicine

## 2019-05-01 DIAGNOSIS — M5416 Radiculopathy, lumbar region: Secondary | ICD-10-CM

## 2019-05-02 ENCOUNTER — Other Ambulatory Visit: Payer: Self-pay | Admitting: Sports Medicine

## 2019-05-05 ENCOUNTER — Other Ambulatory Visit: Payer: Self-pay | Admitting: Sports Medicine

## 2019-06-01 ENCOUNTER — Other Ambulatory Visit: Payer: Self-pay | Admitting: Sports Medicine

## 2019-06-01 DIAGNOSIS — M5416 Radiculopathy, lumbar region: Secondary | ICD-10-CM

## 2019-07-06 ENCOUNTER — Other Ambulatory Visit: Payer: Self-pay | Admitting: Sports Medicine

## 2019-07-06 DIAGNOSIS — Z981 Arthrodesis status: Secondary | ICD-10-CM

## 2019-07-06 DIAGNOSIS — M5416 Radiculopathy, lumbar region: Secondary | ICD-10-CM

## 2019-07-18 ENCOUNTER — Other Ambulatory Visit: Payer: Self-pay

## 2019-07-18 ENCOUNTER — Ambulatory Visit (INDEPENDENT_AMBULATORY_CARE_PROVIDER_SITE_OTHER): Payer: 59 | Admitting: Sports Medicine

## 2019-07-18 ENCOUNTER — Ambulatory Visit (INDEPENDENT_AMBULATORY_CARE_PROVIDER_SITE_OTHER): Payer: 59

## 2019-07-18 DIAGNOSIS — M1812 Unilateral primary osteoarthritis of first carpometacarpal joint, left hand: Secondary | ICD-10-CM | POA: Diagnosis not present

## 2019-07-18 DIAGNOSIS — M51369 Other intervertebral disc degeneration, lumbar region without mention of lumbar back pain or lower extremity pain: Secondary | ICD-10-CM

## 2019-07-18 DIAGNOSIS — F39 Unspecified mood [affective] disorder: Secondary | ICD-10-CM

## 2019-07-18 DIAGNOSIS — M5136 Other intervertebral disc degeneration, lumbar region: Secondary | ICD-10-CM

## 2019-07-18 DIAGNOSIS — G894 Chronic pain syndrome: Secondary | ICD-10-CM | POA: Diagnosis not present

## 2019-07-18 DIAGNOSIS — M180 Bilateral primary osteoarthritis of first carpometacarpal joints: Secondary | ICD-10-CM | POA: Insufficient documentation

## 2019-07-18 DIAGNOSIS — Z981 Arthrodesis status: Secondary | ICD-10-CM | POA: Diagnosis not present

## 2019-07-18 MED ORDER — AMITRIPTYLINE HCL 50 MG PO TABS: 50.0000 mg | ORAL_TABLET | Freq: Every day | ORAL | 3 refills | Status: DC

## 2019-07-18 MED ORDER — GABAPENTIN 300 MG PO CAPS: ORAL_CAPSULE | ORAL | 3 refills | Status: DC

## 2019-07-18 MED ORDER — DULOXETINE HCL 60 MG PO CPEP: ORAL_CAPSULE | ORAL | 3 refills | Status: DC

## 2019-07-18 MED ORDER — IBUPROFEN 800 MG PO TABS: 800.0000 mg | ORAL_TABLET | Freq: Three times a day (TID) | ORAL | 2 refills | Status: DC | PRN

## 2019-07-18 NOTE — Assessment & Plan Note (Signed)
Restarting Cymbalta, amitriptyline.

## 2019-07-18 NOTE — Progress Notes (Signed)
    Procedures performed today:    None.  Independent interpretation of notes and tests performed by another provider:   None.  Brief History, Exam, Impression, and Recommendations:    Lumbar degenerative disc disease Sara Wolfe returns, she is having recurrence of low back pain, she was approximately 75% better after right L4-L5 transforaminal epidural back in 2020. Restarting gabapentin, I would also like to proceed with a right L4-L5 transforaminal epidural.  Mood disorder (HCC) Restarting Cymbalta, amitriptyline.  Primary osteoarthritis of first carpometacarpal joint of left hand Starting with prescription strength ibuprofen, x-rays, carpometacarpal rehab exercises, return to see me in 1 month, injection if no better.    ___________________________________________ Gwen Her. [name], M.D., ABFM., CAQSM. Primary Care and [place] Instructor of [place] of [facility] of Medicine

## 2019-07-18 NOTE — Assessment & Plan Note (Signed)
Sara Wolfe returns, she is having recurrence of low back pain, she was approximately 75% better after right L4-L5 transforaminal epidural back in 2020. Restarting gabapentin, I would also like to proceed with a right L4-L5 transforaminal epidural.

## 2019-07-18 NOTE — Assessment & Plan Note (Signed)
Starting with prescription strength ibuprofen, x-rays, carpometacarpal rehab exercises, return to see me in 1 month, injection if no better.

## 2019-07-31 ENCOUNTER — Other Ambulatory Visit: Payer: Self-pay

## 2019-07-31 ENCOUNTER — Ambulatory Visit
Admission: RE | Admit: 2019-07-31 | Discharge: 2019-07-31 | Disposition: A | Discharge status: Home / Self Care | Payer: 59 | Source: Ambulatory Visit | Attending: Sports Medicine | Admitting: Sports Medicine

## 2019-07-31 DIAGNOSIS — M5136 Other intervertebral disc degeneration, lumbar region: Secondary | ICD-10-CM

## 2019-07-31 MED ORDER — IOPAMIDOL (ISOVUE-M 200) INJECTION 41%
1.0000 mL | Freq: Once | INTRAMUSCULAR | Status: AC
  Administered 2019-07-31: 1 mL via EPIDURAL

## 2019-07-31 MED ORDER — METHYLPREDNISOLONE ACETATE 40 MG/ML INJ SUSP (RADIOLOG
120.0000 mg | Freq: Once | INTRAMUSCULAR | Status: AC
  Administered 2019-07-31: 120 mg via EPIDURAL

## 2019-08-05 ENCOUNTER — Other Ambulatory Visit: Payer: Self-pay | Admitting: Sports Medicine

## 2019-08-05 DIAGNOSIS — M5416 Radiculopathy, lumbar region: Secondary | ICD-10-CM

## 2019-08-15 ENCOUNTER — Other Ambulatory Visit: Payer: Self-pay

## 2019-08-15 ENCOUNTER — Ambulatory Visit (INDEPENDENT_AMBULATORY_CARE_PROVIDER_SITE_OTHER): Payer: 59 | Admitting: Sports Medicine

## 2019-08-15 ENCOUNTER — Encounter: Payer: Self-pay | Admitting: Sports Medicine

## 2019-08-15 DIAGNOSIS — E8881 Metabolic syndrome: Secondary | ICD-10-CM

## 2019-08-15 DIAGNOSIS — E785 Hyperlipidemia, unspecified: Secondary | ICD-10-CM

## 2019-08-15 DIAGNOSIS — M1812 Unilateral primary osteoarthritis of first carpometacarpal joint, left hand: Secondary | ICD-10-CM | POA: Diagnosis not present

## 2019-08-15 DIAGNOSIS — E538 Deficiency of other specified B group vitamins: Secondary | ICD-10-CM | POA: Diagnosis not present

## 2019-08-15 DIAGNOSIS — R21 Rash and other nonspecific skin eruption: Secondary | ICD-10-CM | POA: Insufficient documentation

## 2019-08-15 DIAGNOSIS — M5136 Other intervertebral disc degeneration, lumbar region: Secondary | ICD-10-CM

## 2019-08-15 DIAGNOSIS — M51369 Other intervertebral disc degeneration, lumbar region without mention of lumbar back pain or lower extremity pain: Secondary | ICD-10-CM

## 2019-08-15 MED ORDER — CYANOCOBALAMIN 1000 MCG/ML IJ SOLN
1000.0000 ug | Freq: Once | INTRAMUSCULAR | Status: AC
  Administered 2019-08-15: 1000 ug via INTRAMUSCULAR

## 2019-08-15 MED ORDER — TRIAMCINOLONE ACETONIDE 0.5 % EX CREA: 1.0000 "application " | TOPICAL_CREAM | Freq: Two times a day (BID) | CUTANEOUS | 3 refills | Status: DC

## 2019-08-15 MED ORDER — GABAPENTIN 600 MG PO TABS: 600.0000 mg | ORAL_TABLET | Freq: Three times a day (TID) | ORAL | 11 refills | Status: DC

## 2019-08-15 NOTE — Assessment & Plan Note (Signed)
Improved considerably with prescription strength ibuprofen, carpometacarpal rehab exercises. No injection needed.

## 2019-08-15 NOTE — Assessment & Plan Note (Signed)
Sara Wolfe is doing very well after her right L4-L5 transforaminal epidural. Gabapentin has helped as well, she would like to go up on the dose to 600 mg 3 times daily.

## 2019-08-15 NOTE — Assessment & Plan Note (Signed)
Rechecking B12 levels. She will get an injection today.

## 2019-08-15 NOTE — Assessment & Plan Note (Signed)
Rechecking lipids.  Historically stable.

## 2019-08-15 NOTE — Progress Notes (Addendum)
    Procedures performed today:    None.  Independent interpretation of notes and tests performed by another provider:   None.  Brief History, Exam, Impression, and Recommendations:    Hyperlipidemia Rechecking lipids.  Historically stable.  Metabolic syndrome Rechecking hemoglobin A1c.  Primary osteoarthritis of first carpometacarpal joint of left hand Improved considerably with prescription strength ibuprofen, carpometacarpal rehab exercises. No injection needed.  Vitamin B 12 deficiency Rechecking B12 levels. She will get an injection today.  Lumbar degenerative disc disease Harmani is doing very well after her right L4-L5 transforaminal epidural. Gabapentin has helped as well, she would like to go up on the dose to 600 mg 3 times daily.  Skin rash There is a very mild appearing pruritic eczematous type rash on the upper chest. No identifiable causative factors. Adding triamcinolone cream to be used twice daily for a week, return if no better after 1 to 2 weeks.  Hypocalcemia Calcium continues to be low, I would like her to get an ionized calcium, vit D, and PTH level drawn.    ___________________________________________ Gwen Her. [name], M.D., ABFM., CAQSM. Primary Care and [place] Instructor of [place] of [facility] of Medicine

## 2019-08-15 NOTE — Assessment & Plan Note (Signed)
There is a very mild appearing pruritic eczematous type rash on the upper chest. No identifiable causative factors. Adding triamcinolone cream to be used twice daily for a week, return if no better after 1 to 2 weeks.

## 2019-08-15 NOTE — Assessment & Plan Note (Signed)
Rechecking hemoglobin A1c 

## 2019-08-18 ENCOUNTER — Other Ambulatory Visit: Payer: Self-pay | Admitting: Sports Medicine

## 2019-09-04 ENCOUNTER — Other Ambulatory Visit: Payer: Self-pay | Admitting: Sports Medicine

## 2019-09-04 DIAGNOSIS — M5416 Radiculopathy, lumbar region: Secondary | ICD-10-CM

## 2019-09-11 LAB — CBC
HCT: 39.5 % (ref 35.0–45.0)
Hemoglobin: 13.2 g/dL (ref 11.7–15.5)
MCH: 31.5 pg (ref 27.0–33.0)
MCHC: 33.4 g/dL (ref 32.0–36.0)
MCV: 94.3 fL (ref 80.0–100.0)
MPV: 10.7 fL (ref 7.5–12.5)
Platelets: 269 10*3/uL (ref 140–400)
RBC: 4.19 10*6/uL (ref 3.80–5.10)
RDW: 12.6 % (ref 11.0–15.0)
WBC: 8.4 10*3/uL (ref 3.8–10.8)

## 2019-09-11 LAB — COMPLETE METABOLIC PANEL WITH GFR
AG Ratio: 1.9 (calc) (ref 1.0–2.5)
ALT: 17 U/L (ref 6–29)
AST: 16 U/L (ref 10–35)
Albumin: 3.6 g/dL (ref 3.6–5.1)
Alkaline phosphatase (APISO): 81 U/L (ref 37–153)
BUN: 13 mg/dL (ref 7–25)
CO2: 22 mmol/L (ref 20–32)
Calcium: 8.2 mg/dL — ABNORMAL LOW (ref 8.6–10.4)
Chloride: 111 mmol/L — ABNORMAL HIGH (ref 98–110)
Creat: 0.54 mg/dL (ref 0.50–0.99)
GFR, Est African American: 119 mL/min/{1.73_m2} (ref >=60)
GFR, Est Non African American: 103 mL/min/{1.73_m2} (ref >=60)
Globulin: 1.9 g/dL (calc) (ref 1.9–3.7)
Glucose, Bld: 107 mg/dL — ABNORMAL HIGH (ref 65–99)
Potassium: 3.8 mmol/L (ref 3.5–5.3)
Sodium: 140 mmol/L (ref 135–146)
Total Bilirubin: 0.3 mg/dL (ref 0.2–1.2)
Total Protein: 5.5 g/dL — ABNORMAL LOW (ref 6.1–8.1)

## 2019-09-11 LAB — LIPID PANEL W/REFLEX DIRECT LDL
Cholesterol: 173 mg/dL (ref <200)
HDL: 71 mg/dL (ref >=50)
LDL Cholesterol (Calc): 87 mg/dL (calc)
Non-HDL Cholesterol (Calc): 102 mg/dL (calc) (ref <130)
Total CHOL/HDL Ratio: 2.4 (calc) (ref <5.0)
Triglycerides: 65 mg/dL (ref <150)

## 2019-09-11 LAB — VITAMIN B12: Vitamin B-12: 1022 pg/mL (ref 200–1100)

## 2019-09-11 LAB — TSH: TSH: 1.33 mIU/L (ref 0.40–4.50)

## 2019-09-11 LAB — HEMOGLOBIN A1C
Hgb A1c MFr Bld: 4.6 % of total Hgb (ref <5.7)
Mean Plasma Glucose: 85 (calc)
eAG (mmol/L): 4.7 (calc)

## 2019-09-11 NOTE — Assessment & Plan Note (Signed)
Calcium continues to be low, I would like her to get an ionized calcium, vit D, and PTH level drawn.

## 2019-09-11 NOTE — Addendum Note (Signed)
Addended by: Silverio Decamp on: 09/11/2019 09:21 AM   Modules accepted: Orders

## 2019-09-19 ENCOUNTER — Telehealth (INDEPENDENT_AMBULATORY_CARE_PROVIDER_SITE_OTHER): Payer: 59 | Admitting: Family Medicine

## 2019-09-19 ENCOUNTER — Encounter: Payer: Self-pay | Admitting: Family Medicine

## 2019-09-19 DIAGNOSIS — J988 Other specified respiratory disorders: Secondary | ICD-10-CM

## 2019-09-19 DIAGNOSIS — B9789 Other viral agents as the cause of diseases classified elsewhere: Secondary | ICD-10-CM | POA: Insufficient documentation

## 2019-09-19 NOTE — Assessment & Plan Note (Signed)
-  She has had vaccine for COVID but symptoms are suspicious for COVID infection with loss of taste and smell.  -Recommend that she have testing for COVID -Will provide letter to remain out work until at least 09/24/19.  -Continue supportive care and recommend increased fluids and rest.  -She is instructed to call for new or worsening symptoms.

## 2019-09-19 NOTE — Progress Notes (Signed)
Sara Wolfe - 59 y.o. female MRN [id number]  Date of birth: 07-Mar-1960   This visit type was conducted due to national recommendations for restrictions regarding the COVID-19 Pandemic (e.g. social distancing).  This format is felt to be most appropriate for this patient at this time.  All issues noted in this document were discussed and addressed.  No physical exam was performed (except for noted visual exam findings with Video Visits).  I discussed the limitations of evaluation and management by telemedicine and the availability of in person appointments. The patient expressed understanding and agreed to proceed.  I connected with@ on 09/19/19 at  1:00 PM EDT by a video enabled telemedicine application and verified that I am speaking with the correct person using two identifiers.  Present at visit: Sara Nutting, DO Sara Wolfe   Patient Location: [facility] [place] [address]   Provider location:   [place]  No chief complaint on file.   HPI  Sara Wolfe is a 59 y.o. female who presents via audio/video conferencing for a telehealth visit today.  She reports 4 days of congestion with cough and runny nose. She has had some intermittent dizziness as well.  She also noticed loss of smell and taste this morning.  She has not had fever, chills, shortness of breath, body aches or headaches.  She has completed COVID vaccine series.    ROS:  A comprehensive ROS was completed and negative except as noted per HPI  Past Medical History:  Diagnosis Date  . Gallstones   . GERD (gastroesophageal reflux disease)    history of prior to gastric bypass  . Hot flashes, menopausal   . Medical history non-contributory   . Migraine   . OSA on CPAP   . PONV (postoperative nausea and vomiting)   . Rectocele 02/13/2018  . Ventral hernia 2018   Small, noted on CT Abd/pel  . Vitamin B 12 deficiency     Past Surgical History:  Procedure Laterality Date  . ANTERIOR CERVICAL  DECOMP/DISCECTOMY FUSION N/A 10/04/2013   Procedure: ANTERIOR CERVICAL DECOMPRESSION/DISCECTOMY FUSION 3 LEVELS;  Surgeon: Sinclair Ship, MD;  Location: [place];  Service: Orthopedics;  Laterality: N/A;  Anterior cervical decompression fusion cervical 4-5, cervical 5-6, cervical 6-7 with instrumentation and allograft  . ANTERIOR CERVICAL DECOMP/DISCECTOMY FUSION N/A 04/11/2014   Procedure: ANTERIOR CERVICAL DECOMPRESSION/DISCECTOMY FUSION 1 LEVEL;  Surgeon: Sinclair Ship, MD;  Location: [place];  Service: Orthopedics;  Laterality: N/A;  Anterior cervical decompression fusion, cervical 7-thoracic 1 with instrumentation and allograft  . CHOLECYSTECTOMY N/A 03/06/2018   Procedure: LAPAROSCOPIC CHOLECYSTECTOMY;  Surgeon: Greer Pickerel, MD;  Location: [facility];  Service: General;  Laterality: N/A;  . COLONOSCOPY  2017  . FACIAL COSMETIC SURGERY    . FOOT SURGERY Bilateral   . GASTRIC BYPASS    . NECK EXPLORATION  2014, 2015  . TUBAL LIGATION    . UPPER GI ENDOSCOPY  2017    Family History  Problem Relation Age of Onset  . Healthy Mother   . Healthy Father   . Colon cancer Sister     Social History   Socioeconomic History  . Marital status: Married    Spouse name: Not on file  . Number of children: 2  . Years of education: 53  . Highest education level: Not on file  Occupational History  . Occupation: [employer]  Tobacco Use  . Smoking status: Light Tobacco Smoker    Packs/day: 0.25  Years: 2.00    Pack years: 0.50    Types: Cigarettes  . Smokeless tobacco: Former Systems developer    Quit date: 08/03/2018  Vaping Use  . Vaping Use: Never used  Substance and Sexual Activity  . Alcohol use: No  . Drug use: No  . Sexual activity: Yes    Birth control/protection: Post-menopausal  Other Topics Concern  . Not on file  Social History Narrative   Lives with husband   Caffeine use: tea daily   Right handed    Social Determinants of Health   Financial Resource Strain:   .  Difficulty of Paying Living Expenses:   Food Insecurity:   . Worried About Charity fundraiser in the Last Year:   . Arboriculturist in the Last Year:   Transportation Needs:   . Film/video editor (Medical):   Marland Kitchen Lack of Transportation (Non-Medical):   Physical Activity:   . Days of Exercise per Week:   . Minutes of Exercise per Session:   Stress:   . Feeling of Stress :   Social Connections:   . Frequency of Communication with Friends and Family:   . Frequency of Social Gatherings with Friends and Family:   . Attends Religious Services:   . Active Member of Clubs or Organizations:   . Attends Archivist Meetings:   Marland Kitchen Marital Status:   Intimate Partner Violence:   . Fear of Current or Ex-Partner:   . Emotionally Abused:   Marland Kitchen Physically Abused:   . Sexually Abused:      Current Outpatient Medications:  .  AMBULATORY NON FORMULARY MEDICATION, Continuous positive airway pressure (CPAP) machine autopap 5-21 cm of H2O pressure (or autoPAP if available), with all supplemental supplies as needed.  To [employer]. AHI=15, Disp: 1 each, Rfl: 0 .  amitriptyline (ELAVIL) 50 MG tablet, Take 1 tablet (50 mg total) by mouth at bedtime., Disp: 90 tablet, Rfl: 3 .  B-D UF III MINI PEN NEEDLES 31G X 5 MM MISC, Inject into the skin as directed., Disp: , Rfl:  .  DULoxetine (CYMBALTA) 60 MG capsule, TAKE 1 CAPSULE BY MOUTH EVERY DAY, Disp: 90 capsule, Rfl: 3 .  gabapentin (NEURONTIN) 600 MG tablet, Take 1 tablet (600 mg total) by mouth 3 (three) times daily., Disp: 90 tablet, Rfl: 11 .  ibuprofen (ADVIL) 800 MG tablet, Take 1 tablet (800 mg total) by mouth every 8 (eight) hours as needed., Disp: 90 tablet, Rfl: 2 .  methocarbamol (ROBAXIN) 500 MG tablet, TAKE 1 TABLET BY MOUTH THREE TIMES A DAY, Disp: 90 tablet, Rfl: 3 .  pregabalin (LYRICA) 200 MG capsule, TAKE 1 CAPSULE BY MOUTH THREE TIMES A DAY, Disp: 90 capsule, Rfl: 1 .  SAXENDA 18 MG/3ML SOPN, Inject into the skin., Disp: , Rfl:  .   traMADol (ULTRAM) 50 MG tablet, TAKE 1 TABLET BY MOUTH EVERY 8 HOURS AS NEEDED FOR PAIN, Disp: 90 tablet, Rfl: 0 .  triamcinolone cream (KENALOG) 0.5 %, Apply 1 application topically 2 (two) times daily. To affected areas., Disp: 30 g, Rfl: 3 .  alendronate (FOSAMAX) 70 MG tablet, Take 1 tablet (70 mg total) by mouth every 7 (seven) days. (Patient not taking: Reported on 09/19/2019), Disp: 4 tablet, Rfl: 11 .  bimatoprost (LATISSE) 0.03 % ophthalmic solution, Place 1gtt on applicator and apply evenly along the skin of the upper eyelid at base of eyelashes qhs; use new applicator for 2nd eye (Patient not taking: Reported on 09/19/2019), Disp:  5 mL, Rfl: 12 .  folic acid (FOLVITE) 1 MG tablet, TAKE 5 TABLETS (5 MG TOTAL) BY MOUTH DAILY. (Patient not taking: Reported on 09/19/2019), Disp: 90 tablet, Rfl: 3 .  gabapentin (NEURONTIN) 300 MG capsule, Take by mouth. (Patient not taking: Reported on 09/19/2019), Disp: , Rfl:  .  nitroGLYCERIN (NITROSTAT) 0.4 MG SL tablet, Place 1 tablet (0.4 mg total) under the tongue every 5 (five) minutes as needed for chest pain. (Patient not taking: Reported on 09/19/2019), Disp: 30 tablet, Rfl: 0 .  rizatriptan (MAXALT-MLT) 10 MG disintegrating tablet, TAKE 1 TABLET (10 MG TOTAL) BY MOUTH AS NEEDED FOR MIGRAINE. MAY REPEAT IN 2 HOURS IF NEEDED (Patient not taking: Reported on 09/19/2019), Disp: 10 tablet, Rfl: 3  EXAM:  VITALS per patient if applicable: Temp 99 F (35.4 C)   GENERAL: alert, oriented, appears well and in no acute distress  HEENT: atraumatic, conjunttiva clear, no obvious abnormalities on inspection of external nose and ears  NECK: normal movements of the head and neck  LUNGS: on inspection no signs of respiratory distress, breathing rate appears normal, no obvious gross SOB, gasping or wheezing  CV: no obvious cyanosis  MS: moves all visible extremities without noticeable abnormality  PSYCH/NEURO: pleasant and cooperative, no obvious depression or  anxiety, speech and thought processing grossly intact  ASSESSMENT AND PLAN:  Discussed the following assessment and plan:  Viral respiratory infection -She has had vaccine for COVID but symptoms are suspicious for COVID infection with loss of taste and smell.  -Recommend that she have testing for COVID -Will provide letter to remain out work until at least 09/24/19.  -Continue supportive care and recommend increased fluids and rest.  -She is instructed to call for new or worsening symptoms.       I discussed the assessment and treatment plan with the patient. The patient was provided an opportunity to ask questions and all were answered. The patient agreed with the plan and demonstrated an understanding of the instructions.   The patient was advised to call back or seek an in-person evaluation if the symptoms worsen or if the condition fails to improve as anticipated.    Sara Nutting, DO

## 2019-09-20 NOTE — Telephone Encounter (Signed)
Pt is requesting for dates on work note to be amended. Pls advise, if appropriate. Thanks.

## 2019-09-20 NOTE — Telephone Encounter (Signed)
Ok to update to requested dates.   Thanks!

## 2019-09-23 HISTORY — PX: REDUCTION MAMMAPLASTY: SUR839

## 2019-09-24 ENCOUNTER — Encounter: Payer: Self-pay | Admitting: Family Medicine

## 2019-09-24 ENCOUNTER — Other Ambulatory Visit: Payer: Self-pay | Admitting: Sports Medicine

## 2019-09-24 ENCOUNTER — Other Ambulatory Visit: Payer: Self-pay | Admitting: Family Medicine

## 2019-09-24 DIAGNOSIS — M1812 Unilateral primary osteoarthritis of first carpometacarpal joint, left hand: Secondary | ICD-10-CM

## 2019-09-24 MED ORDER — HYDROCOD POLST-CPM POLST ER 10-8 MG/5ML PO SUER: 5.0000 mL | Freq: Two times a day (BID) | ORAL | 0 refills | Status: DC | PRN

## 2019-09-25 ENCOUNTER — Encounter: Payer: Self-pay | Admitting: Family Medicine

## 2019-09-26 ENCOUNTER — Encounter: Payer: Self-pay | Admitting: Family Medicine

## 2019-09-26 NOTE — Telephone Encounter (Signed)
Ok to extend until Saturday.  If needs further extension she will need f/u visit.

## 2019-09-26 NOTE — Telephone Encounter (Signed)
Does she need follow up with PCP?

## 2019-09-26 NOTE — Telephone Encounter (Signed)
Form placed in provider's mailbox for completion. Pt sent a separate MyChart msg - requesting a new work note extending her to be out until Saturday. Pls advise, thanks.

## 2019-10-04 ENCOUNTER — Other Ambulatory Visit: Payer: Self-pay | Admitting: Sports Medicine

## 2019-10-04 DIAGNOSIS — M5416 Radiculopathy, lumbar region: Secondary | ICD-10-CM

## 2019-11-04 ENCOUNTER — Other Ambulatory Visit: Payer: Self-pay | Admitting: Sports Medicine

## 2019-11-04 DIAGNOSIS — M5416 Radiculopathy, lumbar region: Secondary | ICD-10-CM

## 2019-11-04 DIAGNOSIS — M810 Age-related osteoporosis without current pathological fracture: Secondary | ICD-10-CM

## 2019-12-04 ENCOUNTER — Ambulatory Visit: Payer: 59 | Admitting: Sports Medicine

## 2019-12-04 ENCOUNTER — Other Ambulatory Visit: Payer: Self-pay

## 2019-12-04 DIAGNOSIS — Z9889 Other specified postprocedural states: Secondary | ICD-10-CM | POA: Diagnosis not present

## 2019-12-04 DIAGNOSIS — E538 Deficiency of other specified B group vitamins: Secondary | ICD-10-CM

## 2019-12-04 MED ORDER — CYANOCOBALAMIN 1000 MCG/ML IJ SOLN
1000.0000 ug | Freq: Once | INTRAMUSCULAR | Status: AC
  Administered 2019-12-04: 1000 ug via INTRAMUSCULAR

## 2019-12-04 NOTE — Assessment & Plan Note (Signed)
Sara Wolfe returns, she is a pleasant 59 year old female, 6 weeks ago she went to [facility] for plastic surgery, she had an abdominoplasty/tummy tuck. Overall she is doing okay, she did have some fullness in her incision line approximately 3 to 4 inches above the umbilicus, as well as some dehiscence of the wound immediately above the umbilicus. She sent the picture to her surgeon who suggested that there may be a seroma. On exam she does have some fullness, it is palpably firm, nonfluctuant so I do not think we can drain this. Is likely organized hematoma versus scar. We dressed her upper umbilical dehiscence, this will need to heal by secondary intention, and I advised her to do warm compresses and massage with the expectation that it would take several months for any hematomas and scars to fully resolve. She can return as needed.

## 2019-12-04 NOTE — Assessment & Plan Note (Signed)
Known vitamin B12 deficiency, we did administer her injection today.

## 2019-12-04 NOTE — Progress Notes (Signed)
° ° °  Procedures performed today:    None.  Independent interpretation of notes and tests performed by another provider:   None.  Brief History, Exam, Impression, and Recommendations:    H/O abdominoplasty Tru returns, she is a pleasant 59 year old female, 6 weeks ago she went to [place] for plastic surgery, she had an abdominoplasty/tummy tuck. Overall she is doing okay, she did have some fullness in her incision line approximately 3 to 4 inches above the umbilicus, as well as some dehiscence of the wound immediately above the umbilicus. She sent the picture to her surgeon who suggested that there may be a seroma. On exam she does have some fullness, it is palpably firm, nonfluctuant so I do not think we can drain this. Is likely organized hematoma versus scar. We dressed her upper umbilical dehiscence, this will need to heal by secondary intention, and I advised her to do warm compresses and massage with the expectation that it would take several months for any hematomas and scars to fully resolve. She can return as needed.  Vitamin B 12 deficiency Known vitamin B12 deficiency, we did administer her injection today.    ___________________________________________ Gwen Her. [name], M.D., ABFM., CAQSM. Primary Care and [place] Instructor of [place] of [facility] of Medicine

## 2019-12-05 ENCOUNTER — Other Ambulatory Visit: Payer: Self-pay | Admitting: Sports Medicine

## 2019-12-05 DIAGNOSIS — M5416 Radiculopathy, lumbar region: Secondary | ICD-10-CM

## 2019-12-05 DIAGNOSIS — M5136 Other intervertebral disc degeneration, lumbar region: Secondary | ICD-10-CM

## 2019-12-31 ENCOUNTER — Other Ambulatory Visit: Payer: Self-pay | Admitting: Sports Medicine

## 2019-12-31 DIAGNOSIS — M1812 Unilateral primary osteoarthritis of first carpometacarpal joint, left hand: Secondary | ICD-10-CM

## 2020-01-02 ENCOUNTER — Other Ambulatory Visit: Payer: Self-pay | Admitting: Sports Medicine

## 2020-01-02 ENCOUNTER — Ambulatory Visit: Payer: 59

## 2020-01-03 ENCOUNTER — Encounter: Payer: Self-pay | Admitting: Sports Medicine

## 2020-01-03 ENCOUNTER — Ambulatory Visit (INDEPENDENT_AMBULATORY_CARE_PROVIDER_SITE_OTHER): Payer: 59 | Admitting: Sports Medicine

## 2020-01-03 VITALS — BP 103/68 | HR 94 | Ht 63.0 in | Wt 175.0 lb

## 2020-01-03 DIAGNOSIS — Z23 Encounter for immunization: Secondary | ICD-10-CM

## 2020-01-03 DIAGNOSIS — Z9889 Other specified postprocedural states: Secondary | ICD-10-CM | POA: Diagnosis not present

## 2020-01-03 DIAGNOSIS — F39 Unspecified mood [affective] disorder: Secondary | ICD-10-CM

## 2020-01-03 DIAGNOSIS — M5416 Radiculopathy, lumbar region: Secondary | ICD-10-CM | POA: Diagnosis not present

## 2020-01-03 DIAGNOSIS — E538 Deficiency of other specified B group vitamins: Secondary | ICD-10-CM

## 2020-01-03 MED ORDER — CYANOCOBALAMIN 1000 MCG/ML IJ SOLN
1000.0000 ug | Freq: Once | INTRAMUSCULAR | Status: AC
  Administered 2020-01-03: 1000 ug via INTRAMUSCULAR

## 2020-01-03 MED ORDER — PREGABALIN 200 MG PO CAPS: ORAL_CAPSULE | ORAL | 1 refills | Status: DC

## 2020-01-03 MED ORDER — TRAMADOL HCL 50 MG PO TABS: 50.0000 mg | ORAL_TABLET | Freq: Three times a day (TID) | ORAL | 3 refills | Status: DC | PRN

## 2020-01-03 NOTE — Assessment & Plan Note (Signed)
Jazzma returns, she is now approximately 10 weeks post abdominoplasty in [place]. She did have some fullness at her incision line, clinically this felt like a hematoma, there was no fluctuance to suggest a drainable seroma. Warm compresses have improved this. Unfortunately she had a bit of dehiscence of the wound immediately above the umbilicus, we had her do wet-to-dry dressings, with hopes of this healing by secondary intention, unfortunately it appears to have enlarged, I would like her to touch base with a local plastic surgeon. Continue wet-to-dry dressings in the meantime.

## 2020-01-03 NOTE — Assessment & Plan Note (Signed)
Restarted Cymbalta and amitriptyline, mood is doing well.

## 2020-01-03 NOTE — Progress Notes (Signed)
    Procedures performed today:    None.  Independent interpretation of notes and tests performed by another provider:   None.  Brief History, Exam, Impression, and Recommendations:    H/O abdominoplasty Sara Wolfe returns, she is now approximately 10 weeks post abdominoplasty in [place]. She did have some fullness at her incision line, clinically this felt like a hematoma, there was no fluctuance to suggest a drainable seroma. Warm compresses have improved this. Unfortunately she had a bit of dehiscence of the wound immediately above the umbilicus, we had her do wet-to-dry dressings, with hopes of this healing by secondary intention, unfortunately it appears to have enlarged, I would like her to touch base with a local plastic surgeon. Continue wet-to-dry dressings in the meantime.  Mood disorder ([place]) Restarted Cymbalta and amitriptyline, mood is doing well.    ___________________________________________ Sara Wolfe, M.D., ABFM., CAQSM. Primary Care and [place] Instructor of [place] of [facility] of Medicine

## 2020-01-24 ENCOUNTER — Encounter: Payer: Self-pay | Admitting: Plastic Surgery

## 2020-01-24 ENCOUNTER — Other Ambulatory Visit: Payer: Self-pay

## 2020-01-24 ENCOUNTER — Ambulatory Visit (INDEPENDENT_AMBULATORY_CARE_PROVIDER_SITE_OTHER): Payer: Self-pay | Admitting: Plastic Surgery

## 2020-01-24 ENCOUNTER — Telehealth: Payer: Self-pay

## 2020-01-24 VITALS — BP 110/71 | HR 82 | Temp 98.2°F | Ht 62.0 in | Wt 173.0 lb

## 2020-01-24 DIAGNOSIS — Z411 Encounter for cosmetic surgery: Secondary | ICD-10-CM

## 2020-01-24 NOTE — Telephone Encounter (Signed)
Wound care instructions per Dr. Claudia Desanctis as folllows:  1) vaseline 2) telfa/non-stick 3) tape/wrap or adhesive of choice Change dressing daily Pt has wound clinic appointment in Jan- Dr. Claudia Desanctis will see her after that appointment if needed- she will call for update/& next appointment Copy of wound care instructions to pt & scanned into chart

## 2020-01-24 NOTE — Progress Notes (Signed)
Referring Provider Silverio Decamp, MD [place] [facility] [address] [place],  [place] [address]   CC: No chief complaint on file.     Sara Wolfe is an 59 y.o. female.  HPI: Patient presents to discuss abdominal wound.  She had a number of procedures done in [place] for cosmetic reasons.  She had a breast lift.  Years ago she had abdominoplasty.  More recently she had a fleur-de-lis abdominoplasty.  She has had liposuction and sounds like with each procedure.  Her most recent procedure she describes as a reverse tummy tuck with liposuction of the upper abdomen.  That was performed at the end of August.  About a month later she developed a wound just above her umbilicus that drained a significant amount of what sounds like hematoma fluid.  She has been treating this since then with wound care but it has yet to resolve.  She was referred by her primary care physician.  She is having normal bowel movements and does not appear to be in significant pain from it but it certainly bothering her to continue to have an open wound.  No Known Allergies  Outpatient Encounter Medications as of 01/24/2020  Medication Sig  . alendronate (FOSAMAX) 70 MG tablet TAKE 1 TABLET (70 MG TOTAL) BY MOUTH EVERY 7 (SEVEN) DAYS.  Marland Kitchen AMBULATORY NON FORMULARY MEDICATION Continuous positive airway pressure (CPAP) machine autopap 5-21 cm of H2O pressure (or autoPAP if available), with all supplemental supplies as needed.  To [employer]. AHI=15  . amitriptyline (ELAVIL) 50 MG tablet Take 1 tablet (50 mg total) by mouth at bedtime.  . B-D UF III MINI PEN NEEDLES 31G X 5 MM MISC Inject into the skin as directed.  . bimatoprost (LATISSE) 0.03 % ophthalmic solution Place 1gtt on applicator and apply evenly along the skin of the upper eyelid at base of eyelashes qhs; use new applicator for 2nd eye (Patient not taking: Reported on 01/03/2020)  . DULoxetine (CYMBALTA) 60 MG capsule TAKE 1 CAPSULE BY MOUTH EVERY DAY  . folic acid  (FOLVITE) 1 MG tablet TAKE 5 TABLETS (5 MG TOTAL) BY MOUTH DAILY. (Patient not taking: Reported on 01/03/2020)  . gabapentin (NEURONTIN) 300 MG capsule Take by mouth.   . gabapentin (NEURONTIN) 600 MG tablet Take 1 tablet (600 mg total) by mouth 3 (three) times daily.  Marland Kitchen ibuprofen (ADVIL) 800 MG tablet TAKE 1 TABLET BY MOUTH EVERY 8 HOURS AS NEEDED  . methocarbamol (ROBAXIN) 500 MG tablet TAKE 1 TABLET BY MOUTH THREE TIMES A DAY  . nitroGLYCERIN (NITROSTAT) 0.4 MG SL tablet Place 1 tablet (0.4 mg total) under the tongue every 5 (five) minutes as needed for chest pain.  . pregabalin (LYRICA) 200 MG capsule 1 capsule oral twice daily to 3 times daily  . rizatriptan (MAXALT-MLT) 10 MG disintegrating tablet TAKE 1 TABLET (10 MG TOTAL) BY MOUTH AS NEEDED FOR MIGRAINE. MAY REPEAT IN 2 HOURS IF NEEDED  . SAXENDA 18 MG/3ML SOPN Inject into the skin.  Marland Kitchen traMADol (ULTRAM) 50 MG tablet Take 1 tablet (50 mg total) by mouth every 8 (eight) hours as needed. for pain  . triamcinolone cream (KENALOG) 0.5 % Apply 1 application topically 2 (two) times daily. To affected areas.   No facility-administered encounter medications on file as of 01/24/2020.     Past Medical History:  Diagnosis Date  . Gallstones   . GERD (gastroesophageal reflux disease)    history of prior to gastric bypass  . Hot flashes,  menopausal   . Medical history non-contributory   . Migraine   . OSA on CPAP   . PONV (postoperative nausea and vomiting)   . Rectocele 02/13/2018  . Ventral hernia 2018   Small, noted on CT Abd/pel  . Vitamin B 12 deficiency     Past Surgical History:  Procedure Laterality Date  . ANTERIOR CERVICAL DECOMP/DISCECTOMY FUSION N/A 10/04/2013   Procedure: ANTERIOR CERVICAL DECOMPRESSION/DISCECTOMY FUSION 3 LEVELS;  Surgeon: Sinclair Ship, MD;  Location: [facility];  Service: Orthopedics;  Laterality: N/A;  Anterior cervical decompression fusion cervical 4-5, cervical 5-6, cervical 6-7 with instrumentation  and allograft  . ANTERIOR CERVICAL DECOMP/DISCECTOMY FUSION N/A 04/11/2014   Procedure: ANTERIOR CERVICAL DECOMPRESSION/DISCECTOMY FUSION 1 LEVEL;  Surgeon: Sinclair Ship, MD;  Location: [facility];  Service: Orthopedics;  Laterality: N/A;  Anterior cervical decompression fusion, cervical 7-thoracic 1 with instrumentation and allograft  . CHOLECYSTECTOMY N/A 03/06/2018   Procedure: LAPAROSCOPIC CHOLECYSTECTOMY;  Surgeon: Greer Pickerel, MD;  [facility];  Service: General;  Laterality: N/A;  . COLONOSCOPY  2017  . FACIAL COSMETIC SURGERY    . FOOT SURGERY Bilateral   . GASTRIC BYPASS    . NECK EXPLORATION  2014, 2015  . TUBAL LIGATION    . UPPER GI ENDOSCOPY  2017    Family History  Problem Relation Age of Onset  . Healthy Mother   . Healthy Father   . Colon cancer Sister     Social History   Social History Narrative   Lives with husband   Caffeine use: tea daily   Right handed      Review of Systems General: Denies fevers, chills, weight loss CV: Denies chest pain, shortness of breath, palpitations  Physical Exam Vitals with BMI 01/24/2020 01/03/2020 08/15/2019  Height 5\' 2"  5\' 3"  -  Weight 173 lbs 175 lbs 164 lbs 2 oz  BMI 63.14 97.02 -  Systolic 637 858 -  Diastolic 71 68 -  Pulse 82 94 -    General:  No acute distress,  Alert and oriented, Non-Toxic, Normal speech and affect Abdomen: Abdomen is mostly soft and nontender.  She has a lower transverse scar in a vertical scar.  Her opening for her umbilicus is just a few millimeters in size.  Just superior to that is a 1.5 cm diameter open wound with mostly healthy granulation tissue at the base.  It does not appear to undermine or track very far in any direction.  There is no fluctuance or subcutaneous fluid that is obviously palpable to me on exam.  She may have some fat necrosis in the inferomedial aspect that is well inferior to this wound.  In the upper abdomen she has some mastopexy incisions but no other transverse  upper abdominal incisions.  Her lower transverse abdominoplasty incision does extend circumferentially.  Assessment/Plan Patient presents with a chronic wound after what sounds like a revision abdominoplasty with liposuction performed in [place].  It sounds like she had a seroma or hematoma in the upper abdomen that drained out of this wound.  She does not feel like it is closing down but she says that the base of the wound has transition from mostly a whitish color to a red color which indicates some healing.  I recommendation to her was to continue with conservative wound care measures for a bit longer prior to considering surgical revision.  Given the tension on the area and the scarring around the umbilicus I am not sure if direct  excision and closure would ultimately be effective.  Furthermore prior to doing anything like that I would recommend a CT scan of the abdomen to get a better sense of what her anatomy is like after [place] outside the country.  She seems in agreement with this plan I have offered to see her again in 4 to 6 weeks.  She also has an appointment with the wound care center at the beginning of January which will be another opportunity for her to check in and have it evaluated.  I have left the follow-up with me up to her but she knows to call the office if she would be interested in the follow-up visit.  All of her questions were answered.  Cindra Presume 01/24/2020, 3:28 PM

## 2020-02-01 ENCOUNTER — Other Ambulatory Visit: Payer: Self-pay | Admitting: Sports Medicine

## 2020-02-04 ENCOUNTER — Ambulatory Visit: Payer: 59

## 2020-02-06 ENCOUNTER — Ambulatory Visit (INDEPENDENT_AMBULATORY_CARE_PROVIDER_SITE_OTHER): Payer: 59 | Admitting: Sports Medicine

## 2020-02-06 DIAGNOSIS — E538 Deficiency of other specified B group vitamins: Secondary | ICD-10-CM

## 2020-02-06 DIAGNOSIS — Z9889 Other specified postprocedural states: Secondary | ICD-10-CM

## 2020-02-06 DIAGNOSIS — Z981 Arthrodesis status: Secondary | ICD-10-CM | POA: Diagnosis not present

## 2020-02-06 MED ORDER — CYANOCOBALAMIN 1000 MCG/ML IJ SOLN
1000.0000 ug | Freq: Once | INTRAMUSCULAR | Status: AC
Start: 2020-02-06 — End: 2020-02-06
  Administered 2020-02-06: 1000 ug via INTRAMUSCULAR

## 2020-02-06 MED ORDER — DULOXETINE HCL 60 MG PO CPEP: ORAL_CAPSULE | ORAL | 3 refills | Status: DC

## 2020-02-06 MED ORDER — SCOPOLAMINE 1 MG/3DAYS TD PT72: 1.0000 | MEDICATED_PATCH | TRANSDERMAL | 3 refills | Status: DC

## 2020-02-06 NOTE — Assessment & Plan Note (Addendum)
Sara Wolfe returns, she is a pleasant 59 year old female now approximately 14 weeks post abdominoplasty in [place]. Initially she had some fullness in the incision line that felt like a hematoma/seroma, this has improved, she had a bit of dehiscence of the wound immediately above the umbilicus, we started with wet-to-dry dressings but unfortunately it appeared to enlarge, we had a touch base with plastic surgery here in the [place], who recommended continued wet-to-dry dressings as the tension across the wound would likely preclude primary excision of the scar and primary closure. She is going to set up with the wound care center, because of her abdominal pain I am going to proceed with a CT with contrast to ensure there are no drainable collections. We are not looking at bowel so no oral contrast is needed. She will need a point-of-care BUN and creatinine downstairs.

## 2020-02-06 NOTE — Progress Notes (Signed)
    Procedures performed today:    None.  Independent interpretation of notes and tests performed by another provider:   None.  Brief History, Exam, Impression, and Recommendations:    H/O abdominoplasty Sara Wolfe returns, she is a pleasant 59 year old female now approximately 14 weeks post abdominoplasty in [place]. Initially she had some fullness in the incision line that felt like a hematoma/seroma, this has improved, she had a bit of dehiscence of the wound immediately above the umbilicus, we started with wet-to-dry dressings but unfortunately it appeared to enlarge, we had a touch base with plastic surgery here in the [place], who recommended continued wet-to-dry dressings as the tension across the wound would likely preclude primary excision of the scar and primary closure. She is going to set up with the wound care center, because of her abdominal pain I am going to proceed with a CT with contrast to ensure there are no drainable collections. We are not looking at bowel so no oral contrast is needed. She will need a point-of-care BUN and creatinine downstairs.    ___________________________________________ Gwen Her. [name], M.D., ABFM., CAQSM. Primary Care and [place] Instructor of [place] of [facility] of Medicine

## 2020-02-07 ENCOUNTER — Other Ambulatory Visit: Payer: Self-pay

## 2020-02-07 ENCOUNTER — Ambulatory Visit (INDEPENDENT_AMBULATORY_CARE_PROVIDER_SITE_OTHER): Payer: 59

## 2020-02-07 DIAGNOSIS — Z9889 Other specified postprocedural states: Secondary | ICD-10-CM

## 2020-02-07 DIAGNOSIS — R1013 Epigastric pain: Secondary | ICD-10-CM

## 2020-02-07 LAB — I-STAT CREATININE (MANUAL ENTRY): Creatinine, Ser: 0.2 — AB (ref 0.50–1.10)

## 2020-02-07 MED ORDER — IOHEXOL 300 MG/ML  SOLN
100.0000 mL | Freq: Once | INTRAMUSCULAR | Status: AC | PRN
  Administered 2020-02-07: 100 mL via INTRAVENOUS

## 2020-02-18 DIAGNOSIS — Z981 Arthrodesis status: Secondary | ICD-10-CM

## 2020-02-19 MED ORDER — DULOXETINE HCL 60 MG PO CPEP: ORAL_CAPSULE | ORAL | 3 refills | Status: DC

## 2020-02-25 ENCOUNTER — Encounter (HOSPITAL_BASED_OUTPATIENT_CLINIC_OR_DEPARTMENT_OTHER): Payer: 59 | Admitting: Internal Medicine

## 2020-03-04 ENCOUNTER — Other Ambulatory Visit: Payer: Self-pay | Admitting: Sports Medicine

## 2020-03-05 ENCOUNTER — Ambulatory Visit: Payer: 59

## 2020-03-27 ENCOUNTER — Other Ambulatory Visit: Payer: Self-pay | Admitting: Sports Medicine

## 2020-03-27 DIAGNOSIS — M1812 Unilateral primary osteoarthritis of first carpometacarpal joint, left hand: Secondary | ICD-10-CM

## 2020-04-18 ENCOUNTER — Other Ambulatory Visit (HOSPITAL_COMMUNITY)
Admission: RE | Admit: 2020-04-18 | Discharge: 2020-04-18 | Disposition: A | Discharge status: Home / Self Care | Payer: 59 | Source: Other Acute Inpatient Hospital | Attending: Internal Medicine | Admitting: Internal Medicine

## 2020-04-18 ENCOUNTER — Other Ambulatory Visit: Payer: Self-pay

## 2020-04-18 ENCOUNTER — Encounter (HOSPITAL_BASED_OUTPATIENT_CLINIC_OR_DEPARTMENT_OTHER): Payer: 59 | Attending: Internal Medicine | Admitting: Internal Medicine

## 2020-04-18 DIAGNOSIS — B999 Unspecified infectious disease: Secondary | ICD-10-CM | POA: Insufficient documentation

## 2020-04-18 DIAGNOSIS — Y838 Other surgical procedures as the cause of abnormal reaction of the patient, or of later complication, without mention of misadventure at the time of the procedure: Secondary | ICD-10-CM | POA: Insufficient documentation

## 2020-04-18 DIAGNOSIS — T8131XA Disruption of external operation (surgical) wound, not elsewhere classified, initial encounter: Secondary | ICD-10-CM | POA: Diagnosis not present

## 2020-04-18 NOTE — Progress Notes (Signed)
Sara, Wolfe ([id number]) Visit Report for 04/18/2020 Abuse/Suicide Risk Screen Details Patient Name: Date of Service: Sara Wolfe RA 04/18/2020 2:45 PM Medical Record Number: [id number] Patient Account Number: [id number] Date of Birth/Sex: Treating RN: February 18, 1961 (60 y.o. Female) Rhae Hammock Primary Care Provider: Aundria Mems Other Clinician: Referring Provider: Treating Provider/Extender: Jolyne Loa in Treatment: 0 Abuse/Suicide Risk Screen Items Answer ABUSE RISK SCREEN: Has anyone close to you tried to hurt or harm you recentlyo No Do you feel uncomfortable with anyone in your familyo No Has anyone forced you do things that you didnt want to doo No Electronic Signature(s) Signed: 04/18/2020 5:08:13 PM By: Rhae Hammock RN Entered By: Rhae Hammock on 04/18/2020 14:59:29 -------------------------------------------------------------------------------- Activities of Daily Living Details Patient Name: Date of Service: Sara Wolfe RA 04/18/2020 2:45 PM Medical Record Number: [id number] Patient Account Number: [id number] Date of Birth/Sex: Treating RN: 13-Sep-1960 (60 y.o. Female) Rhae Hammock Primary Care Provider: Aundria Mems Other Clinician: Referring Provider: Treating Provider/Extender: Jolyne Loa in Treatment: 0 Activities of Daily Living Items Answer Activities of Daily Living (Please select one for each item) Drive Automobile Completely Able T Medications ake Completely Able Use T elephone Completely Able Care for Appearance Completely Able Use T oilet Completely Able Bath / Shower Completely Able Dress Self Completely Able Feed Self Completely Able Walk Completely Able Get In / Out Bed Completely Able Housework Completely Able Prepare Meals Completely [place] Completely Able Shop for Self Completely Able Electronic Signature(s) Signed: 04/18/2020 5:08:13 PM  By: Rhae Hammock RN Entered By: Rhae Hammock on 04/18/2020 14:59:47 -------------------------------------------------------------------------------- Education Screening Details Patient Name: Date of Service: Sara Wolfe RA 04/18/2020 2:45 PM Medical Record Number: [id number] Patient Account Number: [id number] Date of Birth/Sex: Treating RN: 11-01-1960 (60 y.o. Female) Rhae Hammock Primary Care Provider: Aundria Mems Other Clinician: Referring Provider: Treating Provider/Extender: Jolyne Loa in Treatment: 0 Primary Learner Assessed: Patient Learning Preferences/Education Level/Primary Language Learning Preference: Explanation, Demonstration, Communication Board, Printed Material Highest Education Level: High School Preferred Language: English Cognitive Barrier Language Barrier: No Translator Needed: No Memory Deficit: No Emotional Barrier: No Cultural/Religious Beliefs Affecting Medical Care: No Physical Barrier Impaired Vision: Yes Glasses Impaired Hearing: No Decreased Hand dexterity: No Knowledge/Comprehension Knowledge Level: High Comprehension Level: High Ability to understand written instructions: High Ability to understand verbal instructions: High Motivation Anxiety Level: Calm Cooperation: Cooperative Education Importance: Denies Need Interest in Health Problems: Asks Questions Perception: Coherent Willingness to Engage in Self-Management High Activities: Readiness to Engage in Self-Management High Activities: Electronic Signature(s) Signed: 04/18/2020 5:08:13 PM By: Rhae Hammock RN Entered By: Rhae Hammock on 04/18/2020 15:00:44 -------------------------------------------------------------------------------- Fall Risk Assessment Details Patient Name: Date of Service: Sara Wolfe, NO RA 04/18/2020 2:45 PM Medical Record Number: [id number] Patient Account Number: [id number] Date of  Birth/Sex: Treating RN: 1960/04/16 (59 y.o. Female) Rhae Hammock Primary Care Provider: Aundria Mems Other Clinician: Referring Provider: Treating Provider/Extender: Jolyne Loa in Treatment: 0 Fall Risk Assessment Items Have you had 2 or more falls in the last 12 monthso 0 No Have you had any fall that resulted in injury in the last 12 monthso 0 No FALLS RISK SCREEN History of falling - immediate or within 3 months 0 No Secondary diagnosis (Do you have 2 or more medical diagnoseso) 0 No Ambulatory aid None/bed rest/wheelchair/nurse 0 No Crutches/cane/walker 0 No Furniture 0 No Intravenous therapy Access/Saline/Heparin Lock 0 No Gait/Transferring Normal/ bed rest/ wheelchair 0 No Weak (short steps with or without shuffle, stooped but  able to lift head while walking, may seek 0 No support from furniture) Impaired (short steps with shuffle, may have difficulty arising from chair, head down, impaired 0 No balance) Mental Status Oriented to own ability 0 No Electronic Signature(s) Signed: 04/18/2020 5:08:13 PM By: Rhae Hammock RN Entered By: Rhae Hammock on 04/18/2020 15:00:53 -------------------------------------------------------------------------------- Foot Assessment Details Patient Name: Date of Service: Sara Wolfe RA 04/18/2020 2:45 PM Medical Record Number: [id number] Patient Account Number: [id number] Date of Birth/Sex: Treating RN: 1960/06/13 (60 y.o. Female) Rhae Hammock Primary Care Provider: Aundria Mems Other Clinician: Referring Provider: Treating Provider/Extender: Jolyne Loa in Treatment: 0 Foot Assessment Items Site Locations + = Sensation present, - = Sensation absent, C = Callus, U = Ulcer R = Redness, W = Warmth, M = Maceration, PU = Pre-ulcerative lesion F = Fissure, S = Swelling, D = Dryness Assessment Right: Left: Other Deformity: No No Prior Foot  Ulcer: No No Prior Amputation: No No Charcot Joint: No No Ambulatory Status: Gait: Notes pt. not diabetic and she has NO LE WOUNDS Electronic Signature(s) Signed: 04/18/2020 5:08:13 PM By: Rhae Hammock RN Entered By: Rhae Hammock on 04/18/2020 15:01:26 -------------------------------------------------------------------------------- Nutrition Risk Screening Details Patient Name: Date of Service: Sara Wolfe RA 04/18/2020 2:45 PM Medical Record Number: [id number] Patient Account Number: [id number] Date of Birth/Sex: Treating RN: November 26, 1960 (60 y.o. Female) Rhae Hammock Primary Care Provider: Aundria Mems Other Clinician: Referring Provider: Treating Provider/Extender: Jolyne Loa in Treatment: 0 Height (in): 62 Weight (lbs): 165 Body Mass Index (BMI): 30.2 Nutrition Risk Screening Items Score Screening NUTRITION RISK SCREEN: I have an illness or condition that made me change the kind and/or amount of food I eat 0 No I eat fewer than two meals per day 0 No I eat few fruits and vegetables, or milk products 0 No I have three or more drinks of beer, liquor or wine almost every day 0 No I have tooth or mouth problems that make it hard for me to eat 0 No I don't always have enough money to buy the food I need 0 No I eat alone most of the time 0 No I take three or more different prescribed or over-the-counter drugs a day 0 No Without wanting to, I have lost or gained 10 pounds in the last six months 0 No I am not always physically able to shop, cook and/or feed myself 0 No Nutrition Protocols Good Risk Protocol 0 No interventions needed Moderate Risk Protocol High Risk Proctocol Risk Level: Good Risk Score: 0 Electronic Signature(s) Signed: 04/18/2020 5:08:13 PM By: Rhae Hammock RN Entered By: Rhae Hammock on 04/18/2020 15:01:00

## 2020-04-18 NOTE — Progress Notes (Signed)
JAIRY, ANGULO ([id number]) Visit Report for 04/18/2020 Chief Complaint Document Details Patient Name: Date of Service: Sara Wolfe 04/18/2020 2:45 PM Medical Record Number: [id number] Patient Account Number: [id number] Date of Birth/Sex: Treating RN: 02-13-1961 (60 y.o. Female) Baruch Gouty Primary Care Provider: Aundria Mems Other Clinician: Referring Provider: Treating Provider/Extender: Jolyne Loa in Treatment: 0 Information Obtained from: Patient Chief Complaint 04/18/2020; patient is here for remittent reviewable wound that is a postsurgical wound on the periumbilical abdomen. Electronic Signature(s) Signed: 04/18/2020 4:32:00 PM By: Linton Ham MD Entered By: Linton Ham on 04/18/2020 16:01:45 -------------------------------------------------------------------------------- HPI Details Patient Name: Date of Service: Sara Wolfe 04/18/2020 2:45 PM Medical Record Number: [id number] Patient Account Number: [id number] Date of Birth/Sex: Treating RN: 1960-05-02 (60 y.o. Female) Baruch Gouty Primary Care Provider: Aundria Mems Other Clinician: Referring Provider: Treating Provider/Extender: Jolyne Loa in Treatment: 0 History of Present Illness HPI Description: ADMISSION 04/18/2020 This is a 60 year old woman who is here for review of the wound on her abdomen. She had a number of procedures done in [place] for cosmetic reasons starting with a abdominal plasty remotely. More recently she had a fleur-de-lis abdominoplasty with liposuction. In August 2021 she had a reverse tummy tuck with liposuction of the upper abdomen. Sometime in late October/November she developed an open area with drainage. She said the drainage was initially explosive. She has not had a lot of abdominal pain no systemic symptoms. At the suggestion of her surgeon she has been using Dermagran which is a product I am  not that familiar with. I am not sure that she has made much progress with the depth of this but certainly via pictures on her phone there is been improvement in the overall surface area of the original open area. She saw Dr. Claudia Desanctis on 01/24/2020 who suggested a period of time for conservative care before considering surgical revision. Dr. Claudia Desanctis did order a CT scan of the abdomen which was done on 02/07/2020. This showed postsurgical change of the at anterior abdominal wall with possible trace seroma along the midline incision no drainable fluid collection gastric bypass changes Past medical history; includes surgery as noted above, hyperlipidemia, gastric bypass, sleep apnea using bypass, facial cosmetic surgery, Electronic Signature(s) Signed: 04/18/2020 4:32:00 PM By: Linton Ham MD Entered By: Linton Ham on 04/18/2020 16:05:50 -------------------------------------------------------------------------------- Physical Exam Details Patient Name: Date of Service: Sara Wolfe 04/18/2020 2:45 PM Medical Record Number: [id number] Patient Account Number: [id number] Date of Birth/Sex: Treating RN: 09-14-1960 (60 y.o. Female) Baruch Gouty Primary Care Provider: Aundria Mems Other Clinician: Referring Provider: Treating Provider/Extender: Jolyne Loa in Treatment: 0 Constitutional Sitting or standing Blood Pressure is within target range for patient.. Pulse regular and within target range for patient.Marland Kitchen Respirations regular, non-labored and within target range.. Temperature is normal and within the target range for the patient.. Gastrointestinal (GI) Abdomen is soft and non-distended without masses or tenderness.. No liver or spleen enlargement. Integumentary (Hair, Skin) No erythema around the wound area. Notes Wound exam The area in question is just superior to what the patient calls her umbilicus although the umbilicus itself is 2.5 cm in depth.  The area she is concerned about has 2 cm in superior probing depth. Does not not appear to go in any further direction. There was some minimal drainage before I measured this so I went ahead and cultured this but no empiric antibiotics there is no tenderness over around the wound area. Electronic Signature(s) Signed: 04/18/2020  4:32:00 PM By: Linton Ham MD Entered By: Linton Ham on 04/18/2020 16:07:42 -------------------------------------------------------------------------------- Physician Orders Details Patient Name: Date of Service: Sara Wolfe 04/18/2020 2:45 PM Medical Record Number: [id number] Patient Account Number: [id number] Date of Birth/Sex: Treating RN: March 19, 1960 (60 y.o. Female) Baruch Gouty Primary Care Provider: Aundria Mems Other Clinician: Referring Provider: Treating Provider/Extender: Jolyne Loa in Treatment: 0 Verbal / Phone Orders: No Diagnosis Coding Follow-up Appointments Return Appointment in 2 weeks. Bathing/ Shower/ Hygiene May shower and wash wound with soap and water. Wound Treatment Wound #1 - Umbilicus Wound Laterality: Midline Prim Dressing: KerraCel Ag Gelling Fiber Dressing, 2x2 in (silver alginate) ary Discharge Instructions: Apply silver alginate to wound bed,slide into tunnel Secondary Dressing: ComfortFoam Border, 3x3 in (silicone border) Discharge Instructions: Apply over primary dressing as directed. Laboratory naerobe culture (MICRO) - umbilicus Bacteria identified in Unspecified specimen by A LOINC Code: 324-4 Convenience Name: Anerobic culture Electronic Signature(s) Signed: 04/18/2020 4:32:00 PM By: Linton Ham MD Signed: 04/18/2020 4:54:48 PM By: Baruch Gouty RN, BSN Entered By: Baruch Gouty on 04/18/2020 15:49:46 -------------------------------------------------------------------------------- Problem List Details Patient Name: Date of Service: Sara Wolfe 04/18/2020  2:45 PM Medical Record Number: [id number] Patient Account Number: [id number] Date of Birth/Sex: Treating RN: 1960/06/08 (60 y.o. Female) Baruch Gouty Primary Care Provider: Aundria Mems Other Clinician: Referring Provider: Treating Provider/Extender: Jolyne Loa in Treatment: 0 Active Problems ICD-10 Encounter Code Description Active Date MDM Diagnosis T81.31XD Disruption of external operation (surgical) wound, not elsewhere classified, 04/18/2020 No Yes subsequent encounter S31.105S Unspecified open wound of abdominal wall, periumbilic region without 6/44/0347 No Yes penetration into peritoneal cavity, sequela Inactive Problems Resolved Problems Electronic Signature(s) Signed: 04/18/2020 4:32:00 PM By: Linton Ham MD Entered By: Linton Ham on 04/18/2020 15:47:52 -------------------------------------------------------------------------------- Progress Note Details Patient Name: Date of Service: Sara Wolfe 04/18/2020 2:45 PM Medical Record Number: [id number] Patient Account Number: [id number] Date of Birth/Sex: Treating RN: April 25, 1960 (60 y.o. Female) Baruch Gouty Primary Care Provider: Aundria Mems Other Clinician: Referring Provider: Treating Provider/Extender: Jolyne Loa in Treatment: 0 Subjective Chief Complaint Information obtained from Patient 04/18/2020; patient is here for remittent reviewable wound that is a postsurgical wound on the periumbilical abdomen. History of Present Illness (HPI) ADMISSION 04/18/2020 This is a 60 year old woman who is here for review of the wound on her abdomen. She had a number of procedures done in [place] for cosmetic reasons starting with a abdominal plasty remotely. More recently she had a fleur-de-lis abdominoplasty with liposuction. In August 2021 she had a reverse tummy tuck with liposuction of the upper abdomen. Sometime in late  October/November she developed an open area with drainage. She said the drainage was initially explosive. She has not had a lot of abdominal pain no systemic symptoms. At the suggestion of her surgeon she has been using Dermagran which is a product I am not that familiar with. I am not sure that she has made much progress with the depth of this but certainly via pictures on her phone there is been improvement in the overall surface area of the original open area. She saw Dr. Claudia Desanctis on 01/24/2020 who suggested a period of time for conservative care before considering surgical revision. Dr. Claudia Desanctis did order a CT scan of the abdomen which was done on 02/07/2020. This showed postsurgical change of the at anterior abdominal wall with possible trace seroma along the midline incision no drainable fluid collection gastric bypass changes Past medical history; includes surgery as noted  above, hyperlipidemia, gastric bypass, sleep apnea using bypass, facial cosmetic surgery, Patient History Information obtained from Patient. Allergies No Known Allergies Family History Cancer - Siblings, No family history of Diabetes, Heart Disease, Hereditary Spherocytosis, Hypertension, Kidney Disease, Lung Disease, Seizures, Stroke, Thyroid Problems, Tuberculosis. Social History Never smoker, Marital Status - Separated, Alcohol Use - Never, Drug Use - No History, Caffeine Use - Moderate. Medical History Eyes Denies history of Cataracts, Glaucoma, Optic Neuritis Ear/Nose/Mouth/Throat Denies history of Chronic sinus problems/congestion, Middle ear problems Hematologic/Lymphatic Denies history of Anemia, Hemophilia, Human Immunodeficiency Virus, Lymphedema, Sickle Cell Disease Respiratory Patient has history of Sleep Apnea Denies history of Aspiration, Asthma, Chronic Obstructive Pulmonary Disease (COPD), Tuberculosis Cardiovascular Denies history of Angina, Arrhythmia, Congestive Heart Failure, Coronary Artery Disease,  Deep Vein Thrombosis, Hypertension, Hypotension, Myocardial Infarction, Peripheral Arterial Disease, Peripheral Venous Disease, Phlebitis, Vasculitis Gastrointestinal Denies history of Cirrhosis , Colitis, Crohnoos, Hepatitis A, Hepatitis B, Hepatitis C Endocrine Denies history of Type I Diabetes, Type II Diabetes Genitourinary Denies history of End Stage Renal Disease Immunological Denies history of Lupus Erythematosus, Raynaudoos, Scleroderma Integumentary (Skin) Denies history of History of Burn Musculoskeletal Denies history of Gout, Rheumatoid Arthritis, Osteoarthritis, Osteomyelitis Neurologic Denies history of Dementia, Neuropathy, Quadriplegia, Paraplegia, Seizure Disorder Psychiatric Denies history of Anorexia/bulimia, Confinement Anxiety Hospitalization/Surgery History - ANTERIOR cervical decompression/discectomy. - Ant. cervical decomp/descectomy fusion. - cholecystectomy. - facial cosmetic surgery 2017. - foot surgery BLE. - August 2020 Breast reduction/lift and tummy tuck. - August 2021 reverse tummy tuck and forehead/eyebrow lift. - Gastric bypass 22018. - neck surgery x 2. - tubal ligations. Medical A Surgical History Notes nd Respiratory pt. on CPAP at nighttime! Gastrointestinal Hx of GERD Neurologic hx of MIGRAINES Review of Systems (ROS) Constitutional Symptoms (General Health) Denies complaints or symptoms of Fatigue, Fever, Chills, Marked Weight Change. Eyes Complains or has symptoms of Glasses / Contacts. Denies complaints or symptoms of Dry Eyes, Vision Changes. Ear/Nose/Mouth/Throat Denies complaints or symptoms of Chronic sinus problems or rhinitis. Respiratory Denies complaints or symptoms of Chronic or frequent coughs, Shortness of Breath. Cardiovascular Denies complaints or symptoms of Chest pain. Gastrointestinal Denies complaints or symptoms of Frequent diarrhea, Nausea, Vomiting. Endocrine Denies complaints or symptoms of Heat/cold  intolerance. Genitourinary Denies complaints or symptoms of Frequent urination. Integumentary (Skin) Complains or has symptoms of Wounds. Musculoskeletal Denies complaints or symptoms of Muscle Pain, Muscle Weakness. Neurologic Denies complaints or symptoms of Numbness/parasthesias. Psychiatric Denies complaints or symptoms of Claustrophobia, Suicidal. Objective Constitutional Sitting or standing Blood Pressure is within target range for patient.. Pulse regular and within target range for patient.Marland Kitchen Respirations regular, non-labored and within target range.. Temperature is normal and within the target range for the patient.. Vitals Time Taken: 2:53 PM, Height: 62 in, Source: Stated, Weight: 165 lbs, Source: Stated, BMI: 30.2, Temperature: 97.4 F, Pulse: 76 bpm, Respiratory Rate: 17 breaths/min, Blood Pressure: 124/84 mmHg. Gastrointestinal (GI) Abdomen is soft and non-distended without masses or tenderness.. No liver or spleen enlargement. General Notes: Wound exam ooThe area in question is just superior to what the patient calls her umbilicus although the umbilicus itself is 2.5 cm in depth. The area she is concerned about has 2 cm in superior probing depth. Does not not appear to go in any further direction. There was some minimal drainage before I measured this so I went ahead and cultured this but no empiric antibiotics there is no tenderness over around the wound area. Integumentary (Hair, Skin) No erythema around the wound area. Wound #1 status is Open. Original cause  of wound was Surgical Injury. The date acquired was: 12/27/2019. The wound is located on the Midline Umbilicus. The wound measures 0.3cm length x 0.4cm width x 0.4cm depth; 0.094cm^2 area and 0.038cm^3 volume. There is no undermining noted, however, there is tunneling at 12:00 with a maximum distance of 2cm. There is a medium amount of serosanguineous drainage noted. The wound margin is distinct with the outline  attached to the wound base. There is large (67-100%) red granulation within the wound bed. There is no necrotic tissue within the wound bed. Assessment Active Problems ICD-10 Disruption of external operation (surgical) wound, not elsewhere classified, subsequent encounter Unspecified open wound of abdominal wall, periumbilic region without penetration into peritoneal cavity, sequela Plan Follow-up Appointments: Return Appointment in 2 weeks. Bathing/ Shower/ Hygiene: May shower and wash wound with soap and water. Laboratory ordered were: Anerobic culture - umbilicus WOUND #1: - Umbilicus Wound Laterality: Midline Prim Dressing: KerraCel Ag Gelling Fiber Dressing, 2x2 in (silver alginate) ary Discharge Instructions: Apply silver alginate to wound bed,slide into tunnel Secondary Dressing: ComfortFoam Border, 3x3 in (silicone border) Discharge Instructions: Apply over primary dressing as directed. 1. Abdominal wound likely secondary to original hematoma. She does not appear to have had any further or minimal drainage coming out of this area so I do not think any further imaging is necessary. 2. I did culture the area and I will act on that with systemic antibiotics. In my experience this sometimes helps. 3. We use silver alginate packing strips that the patient expressed knowledge about. 4. I would give this about 4 weeks to see if it goes anywhere at all. At that point may be changed to iodoform packing for about the same period of time. I have told her that at that point I would take Dr. Claudia Desanctis up on his offer. In my experience wounds like this are notoriously difficult to close I spent 35 minutes in review of this patient's past medical history, face-to-face evaluation and preparation of this record Electronic Signature(s) Signed: 04/18/2020 4:32:00 PM By: Linton Ham MD Entered By: Linton Ham on 04/18/2020  16:09:53 -------------------------------------------------------------------------------- HxROS Details Patient Name: Date of Service: Sara Wolfe 04/18/2020 2:45 PM Medical Record Number: [id number] Patient Account Number: [id number] Date of Birth/Sex: Treating RN: [birth year] (60 y.o. Female) Rhae Hammock Primary Care Provider: Aundria Mems Other Clinician: Referring Provider: Treating Provider/Extender: Jolyne Loa in Treatment: 0 Information Obtained From Patient Constitutional Symptoms (General Health) Complaints and Symptoms: Negative for: Fatigue; Fever; Chills; Marked Weight Change Eyes Complaints and Symptoms: Positive for: Glasses / Contacts Negative for: Dry Eyes; Vision Changes Medical History: Negative for: Cataracts; Glaucoma; Optic Neuritis Ear/Nose/Mouth/Throat Complaints and Symptoms: Negative for: Chronic sinus problems or rhinitis Medical History: Negative for: Chronic sinus problems/congestion; Middle ear problems Respiratory Complaints and Symptoms: Negative for: Chronic or frequent coughs; Shortness of Breath Medical History: Positive for: Sleep Apnea Negative for: Aspiration; Asthma; Chronic Obstructive Pulmonary Disease (COPD); Tuberculosis Past Medical History Notes: pt. on CPAP at nighttime! Cardiovascular Complaints and Symptoms: Negative for: Chest pain Medical History: Negative for: Angina; Arrhythmia; Congestive Heart Failure; Coronary Artery Disease; Deep Vein Thrombosis; Hypertension; Hypotension; Myocardial Infarction; Peripheral Arterial Disease; Peripheral Venous Disease; Phlebitis; Vasculitis Gastrointestinal Complaints and Symptoms: Negative for: Frequent diarrhea; Nausea; Vomiting Medical History: Negative for: Cirrhosis ; Colitis; Crohns; Hepatitis A; Hepatitis B; Hepatitis C Past Medical History Notes: Hx of GERD Endocrine Complaints and Symptoms: Negative for: Heat/cold  intolerance Medical History: Negative for: Type I Diabetes; Type II Diabetes Genitourinary Complaints  and Symptoms: Negative for: Frequent urination Medical History: Negative for: End Stage Renal Disease Integumentary (Skin) Complaints and Symptoms: Positive for: Wounds Medical History: Negative for: History of Burn Musculoskeletal Complaints and Symptoms: Negative for: Muscle Pain; Muscle Weakness Medical History: Negative for: Gout; Rheumatoid Arthritis; Osteoarthritis; Osteomyelitis Neurologic Complaints and Symptoms: Negative for: Numbness/parasthesias Medical History: Negative for: Dementia; Neuropathy; Quadriplegia; Paraplegia; Seizure Disorder Past Medical History Notes: hx of MIGRAINES Psychiatric Complaints and Symptoms: Negative for: Claustrophobia; Suicidal Medical History: Negative for: Anorexia/bulimia; Confinement Anxiety Hematologic/Lymphatic Medical History: Negative for: Anemia; Hemophilia; Human Immunodeficiency Virus; Lymphedema; Sickle Cell Disease Immunological Medical History: Negative for: Lupus Erythematosus; Raynauds; Scleroderma Oncologic Immunizations Pneumococcal Vaccine: Received Pneumococcal Vaccination: No Immunization Notes: pt. doesn't remember when she last had a tetanus shot Implantable Devices None Hospitalization / Surgery History Type of Hospitalization/Surgery ANTERIOR cervical decompression/discectomy Ant. cervical decomp/descectomy fusion cholecystectomy facial cosmetic surgery 2017 foot surgery BLE August 2020 Breast reduction/lift and tummy tuck August 2021 reverse tummy tuck and forehead/eyebrow lift Gastric bypass 22018 neck surgery x 2 tubal ligations Family and Social History Cancer: Yes - Siblings; Diabetes: No; Heart Disease: No; Hereditary Spherocytosis: No; Hypertension: No; Kidney Disease: No; Lung Disease: No; Seizures: No; Stroke: No; Thyroid Problems: No; Tuberculosis: No; Never smoker; Marital Status -  Separated; Alcohol Use: Never; Drug Use: No History; Caffeine Use: Moderate; Financial Concerns: No; Food, Clothing or Shelter Needs: No; Support System Lacking: No; Transportation Concerns: No Engineer, maintenance) Signed: 04/18/2020 4:32:00 PM By: Linton Ham MD Signed: 04/18/2020 5:08:13 PM By: Rhae Hammock RN Entered By: Rhae Hammock on 04/18/2020 15:11:36 -------------------------------------------------------------------------------- SuperBill Details Patient Name: Date of Service: Sara Wolfe 04/18/2020 Medical Record Number: [id number] Patient Account Number: [id number] Date of Birth/Sex: Treating RN: 04/03/1960 (60 y.o. Female) Baruch Gouty Primary Care Provider: Aundria Mems Other Clinician: Referring Provider: Treating Provider/Extender: Jolyne Loa in Treatment: 0 Diagnosis Coding ICD-10 Codes Code Description T81.31XD Disruption of external operation (surgical) wound, not elsewhere classified, subsequent encounter S31.105S Unspecified open wound of abdominal wall, periumbilic region without penetration into peritoneal cavity, sequela Facility Procedures CPT4 Code: [id number] Description: 99214 - WOUND CARE VISIT-LEV 4 EST PT Modifier: Quantity: 1 Physician Procedures : CPT4 Code Description Modifier [id number] WC PHYS LEVEL 3 NEW PT ICD-10 Diagnosis Description T81.31XD Disruption of external operation (surgical) wound, not elsewhere classified, subsequent encounter S31.105S Unspecified open wound of abdominal wall,  periumbilic region without penetration into peritoneal cavi Quantity: 1 ty, sequela Electronic Signature(s) Signed: 04/18/2020 4:32:00 PM By: Linton Ham MD Entered By: Linton Ham on 04/18/2020 16:12:10

## 2020-04-18 NOTE — Progress Notes (Signed)
[name], [name] ([id number]) Visit Report for 04/18/2020 Allergy List Details Patient Name: Date of Service: Sara Wolfe RA 04/18/2020 2:45 PM Medical Record Number: [id number] Patient Account Number: [id number] Date of Birth/Sex: Treating RN: 04-15-1960 (60 y.o. Female) Rhae Hammock Primary Care Provider: Aundria Mems Other Clinician: Referring Provider: Treating Provider/Extender: Jolyne Loa in Treatment: 0 Allergies Active Allergies No Known Allergies Allergy Notes Electronic Signature(s) Signed: 04/18/2020 5:08:13 PM By: Rhae Hammock RN Entered By: Rhae Hammock on 04/18/2020 14:55:26 -------------------------------------------------------------------------------- Arrival Information Details Patient Name: Date of Service: Sara Wolfe RA 04/18/2020 2:45 PM Medical Record Number: [id number] Patient Account Number: [id number] Date of Birth/Sex: Treating RN: 1960-10-07 (60 y.o. Female) Rhae Hammock Primary Care Provider: Aundria Mems Other Clinician: Referring Provider: Treating Provider/Extender: Jolyne Loa in Treatment: 0 Visit Information Patient Arrived: Ambulatory Arrival Time: 14:43 Accompanied By: self Transfer Assistance: None Patient Identification Verified: Yes Secondary Verification Process Completed: Yes Patient Requires Transmission-Based Precautions: No Patient Has Alerts: No Electronic Signature(s) Signed: 04/18/2020 5:08:13 PM By: Rhae Hammock RN Entered By: Rhae Hammock on 04/18/2020 14:51:39 -------------------------------------------------------------------------------- Clinic Level of Care Assessment Details Patient Name: Date of Service: Sara Wolfe RA 04/18/2020 2:45 PM Medical Record Number: [id number] Patient Account Number: [id number] Date of Birth/Sex: Treating RN: 12-28-1960 (60 y.o. Female) Baruch Gouty Primary Care Provider: Aundria Mems Other Clinician: Referring Provider: Treating Provider/Extender: Jolyne Loa in Treatment: 0 Clinic Level of Care Assessment Items TOOL 2 Quantity Score []  - 0 Use when only an EandM is performed on the INITIAL visit ASSESSMENTS - Nursing Assessment / Reassessment X- 1 20 General Physical Exam (combine w/ comprehensive assessment (listed just below) when performed on new pt. evals) X- 1 25 Comprehensive Assessment (HX, ROS, Risk Assessments, Wounds Hx, etc.) ASSESSMENTS - Wound and Skin A ssessment / Reassessment X - Simple Wound Assessment / Reassessment - one wound 1 5 []  - 0 Complex Wound Assessment / Reassessment - multiple wounds []  - 0 Dermatologic / Skin Assessment (not related to wound area) ASSESSMENTS - Ostomy and/or Continence Assessment and Care []  - 0 Incontinence Assessment and Management []  - 0 Ostomy Care Assessment and Management (repouching, etc.) PROCESS - Coordination of Care X - Simple Patient / Family Education for ongoing care 1 15 []  - 0 Complex (extensive) Patient / Family Education for ongoing care X- 1 10 Staff obtains Consents, Records, T Results / Process Orders est []  - 0 Staff telephones HHA, Nursing Homes / Clarify orders / etc []  - 0 Routine Transfer to another Facility (non-emergent condition) []  - 0 Routine Hospital Admission (non-emergent condition) X- 1 15 New Admissions / Biomedical engineer / Ordering NPWT Apligraf, etc. , []  - 0 Emergency Hospital Admission (emergent condition) X- 1 10 Simple Discharge Coordination []  - 0 Complex (extensive) Discharge Coordination PROCESS - Special Needs []  - 0 Pediatric / Minor Patient Management []  - 0 Isolation Patient Management []  - 0 Hearing / Language / Visual special needs []  - 0 Assessment of Community assistance (transportation, D/C planning, etc.) []  - 0 Additional assistance / Altered mentation []  - 0 Support Surface(s) Assessment  (bed, cushion, seat, etc.) INTERVENTIONS - Wound Cleansing / Measurement X- 1 5 Wound Imaging (photographs - any number of wounds) []  - 0 Wound Tracing (instead of photographs) X- 1 5 Simple Wound Measurement - one wound []  - 0 Complex Wound Measurement - multiple wounds X- 1 5 Simple Wound Cleansing - one wound []  - 0 Complex Wound Cleansing - multiple wounds  INTERVENTIONS - Wound Dressings X - Small Wound Dressing one or multiple wounds 1 10 []  - 0 Medium Wound Dressing one or multiple wounds []  - 0 Large Wound Dressing one or multiple wounds []  - 0 Application of Medications - injection INTERVENTIONS - Miscellaneous []  - 0 External ear exam []  - 0 Specimen Collection (cultures, biopsies, blood, body fluids, etc.) []  - 0 Specimen(s) / Culture(s) sent or taken to Lab for analysis []  - 0 Patient Transfer (multiple staff / Harrel Lemon Lift / Similar devices) []  - 0 Simple Staple / Suture removal (25 or less) []  - 0 Complex Staple / Suture removal (26 or more) []  - 0 Hypo / Hyperglycemic Management (close monitor of Blood Glucose) []  - 0 Ankle / Brachial Index (ABI) - do not check if billed separately Has the patient been seen at the hospital within the last three years: Yes Total Score: 125 Level Of Care: New/Established - Level 4 Electronic Signature(s) Signed: 04/18/2020 4:54:48 PM By: Baruch Gouty RN, BSN Entered By: Baruch Gouty on 04/18/2020 15:42:17 -------------------------------------------------------------------------------- Encounter Discharge Information Details Patient Name: Date of Service: Sara Wolfe RA 04/18/2020 2:45 PM Medical Record Number: [id number] Patient Account Number: [id number] Date of Birth/Sex: Treating RN: Jul 19, 1960 (60 y.o. Female) Deon Pilling Primary Care Provider: Aundria Mems Other Clinician: Referring Provider: Treating Provider/Extender: Jolyne Loa in Treatment: 0 Encounter Discharge  Information Items Discharge Condition: Stable Ambulatory Status: Ambulatory Discharge Destination: Home Transportation: Private Auto Accompanied By: self Schedule Follow-up Appointment: Yes Clinical Summary of Care: Electronic Signature(s) Signed: 04/18/2020 5:07:55 PM By: Deon Pilling Entered By: Deon Pilling on 04/18/2020 17:06:34 -------------------------------------------------------------------------------- Lower Extremity Assessment Details Patient Name: Date of Service: Sara Wolfe RA 04/18/2020 2:45 PM Medical Record Number: [id number] Patient Account Number: [id number] Date of Birth/Sex: Treating RN: 11-01-1960 (60 y.o. Female) Rhae Hammock Primary Care Provider: Aundria Mems Other Clinician: Referring Provider: Treating Provider/Extender: Jolyne Loa in Treatment: 0 Notes pt. has no LE WOUNDS Electronic Signature(s) Signed: 04/18/2020 5:08:13 PM By: Rhae Hammock RN Entered By: Rhae Hammock on 04/18/2020 15:01:44 -------------------------------------------------------------------------------- Multi Wound Chart Details Patient Name: Date of Service: Sara Wolfe RA 04/18/2020 2:45 PM Medical Record Number: [id number] Patient Account Number: [id number] Date of Birth/Sex: Treating RN: Aug 23, 1960 (60 y.o. Female) Baruch Gouty Primary Care Provider: Aundria Mems Other Clinician: Referring Provider: Treating Provider/Extender: Jolyne Loa in Treatment: 0 Vital Signs Height(in): 5 Pulse(bpm): 21 Weight(lbs): 165 Blood Pressure(mmHg): 124/84 Body Mass Index(BMI): 30 Temperature(F): 97.4 Respiratory Rate(breaths/min): 17 Photos: [1:No Photos Midline Umbilicus] [N/A:N/A N/A] Wound Location: [1:Surgical Injury] [N/A:N/A] Wounding Event: [1:Dehisced Wound] [N/A:N/A] Primary Etiology: [1:12/27/2019] [N/A:N/A] Date Acquired: [1:0] [N/A:N/A] Weeks of Treatment: [1:Open]  [N/A:N/A] Wound Status: [1:0.3x0.4x0.4] [N/A:N/A] Measurements L x W x D (cm) [1:0.094] [N/A:N/A] A (cm) : rea [1:0.038] [N/A:N/A] Volume (cm) : [1:12] Position 1 (o'clock): [1:2] Maximum Distance 1 (cm): [1:Yes] [N/A:N/A] Tunneling: [1:Full Thickness Without Exposed] [N/A:N/A] Classification: [1:Support Structures Medium] [N/A:N/A] Exudate Amount: [1:Serosanguineous] [N/A:N/A] Exudate Type: [1:red, brown] [N/A:N/A] Exudate Color: [1:Distinct, outline attached] [N/A:N/A] Wound Margin: [1:Large (67-100%)] [N/A:N/A] Granulation Amount: [1:Red] [N/A:N/A] Granulation Quality: [1:None Present (0%)] [N/A:N/A] Necrotic Amount: [1:Fascia: No] [N/A:N/A] Exposed Structures: [1:Fat Layer (Subcutaneous Tissue): No Tendon: No Muscle: No Joint: No Bone: No Medium (34-66%)] [N/A:N/A] Treatment Notes Electronic Signature(s) Signed: 04/18/2020 4:32:00 PM By: Linton Ham MD Signed: 04/18/2020 4:54:48 PM By: Baruch Gouty RN, BSN Entered By: Linton Ham on 04/18/2020 15:48:04 -------------------------------------------------------------------------------- Multi-Disciplinary Care Plan Details Patient Name: Date of Service: Sara Wolfe RA 04/18/2020 2:45  PM Medical Record Number: [id number] Patient Account Number: [id number] Date of Birth/Sex: Treating RN: 07-04-1960 (60 y.o. Female) Baruch Gouty Primary Care Provider: Aundria Mems Other Clinician: Referring Provider: Treating Provider/Extender: Jolyne Loa in Treatment: 0 Multidisciplinary Care Plan reviewed with physician Active Inactive Wound/Skin Impairment Nursing Diagnoses: Impaired tissue integrity Knowledge deficit related to ulceration/compromised skin integrity Goals: Patient/caregiver will verbalize understanding of skin care regimen Date Initiated: 04/18/2020 Target Resolution Date: 05/16/2020 Goal Status: Active Ulcer/skin breakdown will have a volume reduction of 30% by week  4 Date Initiated: 04/18/2020 Target Resolution Date: 05/16/2020 Goal Status: Active Interventions: Assess patient/caregiver ability to obtain necessary supplies Assess patient/caregiver ability to perform ulcer/skin care regimen upon admission and as needed Assess ulceration(s) every visit Provide education on ulcer and skin care Treatment Activities: Skin care regimen initiated : 04/18/2020 Topical wound management initiated : 04/18/2020 Notes: Electronic Signature(s) Signed: 04/18/2020 4:54:48 PM By: Baruch Gouty RN, BSN Entered By: Baruch Gouty on 04/18/2020 15:36:40 -------------------------------------------------------------------------------- Pain Assessment Details Patient Name: Date of Service: Sara Wolfe RA 04/18/2020 2:45 PM Medical Record Number: [id number] Patient Account Number: [id number] Date of Birth/Sex: Treating RN: 06/23/1960 (60 y.o. Female) Rhae Hammock Primary Care Provider: Aundria Mems Other Clinician: Referring Provider: Treating Provider/Extender: Jolyne Loa in Treatment: 0 Active Problems Location of Pain Severity and Description of Pain Patient Has Paino No Site Locations Pain Management and Medication Current Pain Management: Electronic Signature(s) Signed: 04/18/2020 5:08:13 PM By: Rhae Hammock RN Entered By: Rhae Hammock on 04/18/2020 14:59:13 -------------------------------------------------------------------------------- Patient/Caregiver Education Details Patient Name: Date of Service: Sara Wolfe RA 2/25/2022andnbsp2:45 PM Medical Record Number: [id number] Patient Account Number: [id number] Date of Birth/Gender: Treating RN: 29-Mar-1960 (60 y.o. Female) Baruch Gouty Primary Care Physician: Aundria Mems Other Clinician: Referring Physician: Treating Physician/Extender: Jolyne Loa in Treatment: 0 Education Assessment Education Provided  To: Patient Education Topics Provided Wound/Skin Impairment: Methods: Explain/Verbal Responses: Reinforcements needed, State content correctly Electronic Signature(s) Signed: 04/18/2020 4:54:48 PM By: Baruch Gouty RN, BSN Entered By: Baruch Gouty on 04/18/2020 15:40:54 -------------------------------------------------------------------------------- Wound Assessment Details Patient Name: Date of Service: Sara Wolfe RA 04/18/2020 2:45 PM Medical Record Number: [id number] Patient Account Number: [id number] Date of Birth/Sex: Treating RN: [birth year] (60 y.o. Female) Rhae Hammock Primary Care Provider: Aundria Mems Other Clinician: Referring Provider: Treating Provider/Extender: Jolyne Loa in Treatment: 0 Wound Status Wound Number: 1 Primary Etiology: Dehisced Wound Wound Location: Midline Umbilicus Wound Status: Open Wounding Event: Surgical Injury Comorbid History: Sleep Apnea Date Acquired: 12/27/2019 Weeks Of Treatment: 0 Clustered Wound: No Photos Wound Measurements Length: (cm) 0.3 Width: (cm) 0.4 Depth: (cm) 0.4 Area: (cm) 0.094 Volume: (cm) 0.038 % Reduction in Area: 0% % Reduction in Volume: 0% Epithelialization: Medium (34-66%) Tunneling: Yes Position (o'clock): 12 Maximum Distance: (cm) 2 Undermining: No Wound Description Classification: Full Thickness Without Exposed Support Structures Wound Margin: Distinct, outline attached Exudate Amount: Medium Exudate Type: Serosanguineous Exudate Color: red, brown Foul Odor After Cleansing: No Slough/Fibrino No Wound Bed Granulation Amount: Large (67-100%) Exposed Structure Granulation Quality: Red Fascia Exposed: No Necrotic Amount: None Present (0%) Fat Layer (Subcutaneous Tissue) Exposed: No Tendon Exposed: No Muscle Exposed: No Joint Exposed: No Bone Exposed: No Treatment Notes Wound #1 (Umbilicus) Wound Laterality: Midline Cleanser Peri-Wound  Care Topical Primary Dressing KerraCel Ag Gelling Fiber Dressing, 2x2 in (silver alginate) Discharge Instruction: Apply silver alginate to wound bed,slide into tunnel Secondary Dressing ComfortFoam Border, 3x3 in (silicone border) Discharge Instruction: Apply over primary dressing as directed. Secured With  Compression Wrap Compression Stockings Add-Ons Electronic Signature(s) Signed: 04/18/2020 4:34:48 PM By: Sandre Kitty Signed: 04/18/2020 5:08:13 PM By: Rhae Hammock RN Entered By: Sandre Kitty on 04/18/2020 15:58:24 -------------------------------------------------------------------------------- Vitals Details Patient Name: Date of Service: Sara Wolfe RA 04/18/2020 2:45 PM Medical Record Number: [id number] Patient Account Number: [id number] Date of Birth/Sex: Treating RN: 01-19-1961 (60 y.o. Female) Rhae Hammock Primary Care Provider: Aundria Mems Other Clinician: Referring Provider: Treating Provider/Extender: Jolyne Loa in Treatment: 0 Vital Signs Time Taken: 14:53 Temperature (F): 97.4 Height (in): 62 Pulse (bpm): 76 Source: Stated Respiratory Rate (breaths/min): 17 Weight (lbs): 165 Blood Pressure (mmHg): 124/84 Source: Stated Reference Range: 80 - 120 mg / dl Body Mass Index (BMI): 30.2 Electronic Signature(s) Signed: 04/18/2020 5:08:13 PM By: Rhae Hammock RN Entered By: Rhae Hammock on 04/18/2020 14:54:31

## 2020-04-21 ENCOUNTER — Other Ambulatory Visit: Payer: Self-pay | Admitting: Sports Medicine

## 2020-04-21 LAB — AEROBIC CULTURE W GRAM STAIN (SUPERFICIAL SPECIMEN): Culture: NORMAL

## 2020-04-21 MED ORDER — METHOCARBAMOL 500 MG PO TABS: 500.0000 mg | ORAL_TABLET | Freq: Three times a day (TID) | ORAL | 3 refills | Status: DC

## 2020-04-22 ENCOUNTER — Other Ambulatory Visit: Payer: Self-pay | Admitting: Sports Medicine

## 2020-04-22 DIAGNOSIS — G894 Chronic pain syndrome: Secondary | ICD-10-CM

## 2020-05-01 DIAGNOSIS — M5416 Radiculopathy, lumbar region: Secondary | ICD-10-CM

## 2020-05-02 ENCOUNTER — Other Ambulatory Visit: Payer: Self-pay

## 2020-05-02 ENCOUNTER — Encounter (HOSPITAL_BASED_OUTPATIENT_CLINIC_OR_DEPARTMENT_OTHER): Payer: 59 | Attending: Internal Medicine | Admitting: Physician Assistant

## 2020-05-02 DIAGNOSIS — Y69 Unspecified misadventure during surgical and medical care: Secondary | ICD-10-CM | POA: Diagnosis not present

## 2020-05-02 DIAGNOSIS — T8131XD Disruption of external operation (surgical) wound, not elsewhere classified, subsequent encounter: Secondary | ICD-10-CM | POA: Insufficient documentation

## 2020-05-02 MED ORDER — METHOCARBAMOL 500 MG PO TABS: 500.0000 mg | ORAL_TABLET | Freq: Three times a day (TID) | ORAL | 3 refills | Status: DC

## 2020-05-02 MED ORDER — PREGABALIN 200 MG PO CAPS: ORAL_CAPSULE | ORAL | 3 refills | Status: DC

## 2020-05-02 MED ORDER — TRAMADOL HCL 50 MG PO TABS: 50.0000 mg | ORAL_TABLET | Freq: Three times a day (TID) | ORAL | 3 refills | Status: DC | PRN

## 2020-05-02 NOTE — Progress Notes (Addendum)
YASUKO, LAPAGE ([id number]) Visit Report for 05/02/2020 Chief Complaint Document Details Patient Name: Date of Service: Sara Wolfe RA 05/02/2020 3:30 PM Medical Record Number: [id number] Patient Account Number: [id number] Date of Birth/Sex: Treating RN: 04-25-1960 (60 y.o. Elam Dutch Primary Care Provider: Aundria Mems Other Clinician: Referring Provider: Treating Provider/Extender: Laroy Apple in Treatment: 2 Information Obtained from: Patient Chief Complaint 04/18/2020; patient is here for remittent reviewable wound that is a postsurgical wound on the periumbilical abdomen. Electronic Signature(s) Signed: 05/02/2020 4:19:00 PM By: Worthy Keeler PA-C Entered By: Worthy Keeler on 05/02/2020 16:19:00 -------------------------------------------------------------------------------- HPI Details Patient Name: Date of Service: Sara Wolfe RA 05/02/2020 3:30 PM Medical Record Number: [id number] Patient Account Number: [id number] Date of Birth/Sex: Treating RN: 29-Jun-1960 (60 y.o. Elam Dutch Primary Care Provider: Aundria Mems Other Clinician: Referring Provider: Treating Provider/Extender: Laroy Apple in Treatment: 2 History of Present Illness HPI Description: ADMISSION 04/18/2020 This is a 60 year old woman who is here for review of the wound on her abdomen. She had a number of procedures done in [place] for cosmetic reasons starting with a abdominal plasty remotely. More recently she had a fleur-de-lis abdominoplasty with liposuction. In August 2021 she had a reverse tummy tuck with liposuction of the upper abdomen. Sometime in late October/November she developed an open area with drainage. She said the drainage was initially explosive. She has not had a lot of abdominal pain no systemic symptoms. At the suggestion of her surgeon she has been using Dermagran which is a product I am not that  familiar with. I am not sure that she has made much progress with the depth of this but certainly via pictures on her phone there is been improvement in the overall surface area of the original open area. She saw Dr. Claudia Desanctis on 01/24/2020 who suggested a period of time for conservative care before considering surgical revision. Dr. Claudia Desanctis did order a CT scan of the abdomen which was done on 02/07/2020. This showed postsurgical change of the at anterior abdominal wall with possible trace seroma along the midline incision no drainable fluid collection gastric bypass changes Past medical history; includes surgery as noted above, hyperlipidemia, gastric bypass, sleep apnea using bypass, facial cosmetic surgery, 05/02/2020 upon evaluation today patient appears to be doing well with regard to her wound in the midline umbilicus location. She has been tolerating the dressing changes without complication. Fortunately she seems to be making some progress here. Albeit slowly. There does not appear to be signs of active infection which is great news and overall very pleased in that regard. Electronic Signature(s) Signed: 05/02/2020 7:05:25 PM By: Worthy Keeler PA-C Entered By: Worthy Keeler on 05/02/2020 19:05:24 -------------------------------------------------------------------------------- Physical Exam Details Patient Name: Date of Service: Sara Wolfe RA 05/02/2020 3:30 PM Medical Record Number: [id number] Patient Account Number: [id number] Date of Birth/Sex: Treating RN: August 16, 1960 (60 y.o. Elam Dutch Primary Care Provider: Aundria Mems Other Clinician: Referring Provider: Treating Provider/Extender: Laroy Apple in Treatment: 2 Constitutional Well-nourished and well-hydrated in no acute distress. Respiratory normal breathing without difficulty. Psychiatric this patient is able to make decisions and demonstrates good insight into disease process. Alert  and Oriented x 3. pleasant and cooperative. Notes Patient's wound bed showed signs of good granulation epithelization at this point. There does not appear to be any evidence of active infection which is great news I feel like that the biggest issue she has is that she has  epithelial tissue going down the sidewalls of the opening. This is that required my opinion that she allow this to cover and internally and I think she can have a somewhat of an indention or dimpling in this area. Otherwise I do not see any issues at this point. Electronic Signature(s) Signed: 05/02/2020 7:05:54 PM By: Worthy Keeler PA-C Entered By: Worthy Keeler on 05/02/2020 19:05:54 -------------------------------------------------------------------------------- Physician Orders Details Patient Name: Date of Service: Sara Wolfe RA 05/02/2020 3:30 PM Medical Record Number: [id number] Patient Account Number: [id number] Date of Birth/Sex: Treating RN: 03-07-1960 (60 y.o. Elam Dutch Primary Care Provider: Aundria Mems Other Clinician: Referring Provider: Treating Provider/Extender: Laroy Apple in Treatment: 2 Verbal / Phone Orders: No Diagnosis Coding ICD-10 Coding Code Description T81.31XD Disruption of external operation (surgical) wound, not elsewhere classified, subsequent encounter S31.105S Unspecified open wound of abdominal wall, periumbilic region without penetration into peritoneal cavity, sequela Follow-up Appointments Return Appointment in 2 weeks. Bathing/ Shower/ Hygiene May shower and wash wound with soap and water. Wound Treatment Wound #1 - Umbilicus Wound Laterality: Midline Cleanser: Soap and Water Discharge Instructions: May shower and wash wound with dial antibacterial soap and water prior to dressing change. Prim Dressing: KerraCel Ag Gelling Fiber Dressing, 2x2 in (silver alginate) ary Discharge Instructions: Apply silver alginate to wound  bed,slide into tunnel Secondary Dressing: ComfortFoam Border, 3x3 in (silicone border) Discharge Instructions: Apply over primary dressing as directed. Electronic Signature(s) Signed: 05/02/2020 6:00:02 PM By: Baruch Gouty RN, BSN Signed: 05/02/2020 7:21:59 PM By: Worthy Keeler PA-C Entered By: Baruch Gouty on 05/02/2020 17:00:30 -------------------------------------------------------------------------------- Problem List Details Patient Name: Date of Service: Sara Wolfe RA 05/02/2020 3:30 PM Medical Record Number: [id number] Patient Account Number: [id number] Date of Birth/Sex: Treating RN: February 08, 1961 (60 y.o. Elam Dutch Primary Care Provider: Aundria Mems Other Clinician: Referring Provider: Treating Provider/Extender: Laroy Apple in Treatment: 2 Active Problems ICD-10 Encounter Code Description Active Date MDM Diagnosis T81.31XD Disruption of external operation (surgical) wound, not elsewhere classified, 04/18/2020 No Yes subsequent encounter S31.105S Unspecified open wound of abdominal wall, periumbilic region without 03/25/69 No Yes penetration into peritoneal cavity, sequela Inactive Problems Resolved Problems Electronic Signature(s) Signed: 05/02/2020 4:18:55 PM By: Worthy Keeler PA-C Entered By: Worthy Keeler on 05/02/2020 16:18:55 -------------------------------------------------------------------------------- Progress Note Details Patient Name: Date of Service: Sara Wolfe RA 05/02/2020 3:30 PM Medical Record Number: [id number] Patient Account Number: [id number] Date of Birth/Sex: Treating RN: 27-May-1960 (60 y.o. Elam Dutch Primary Care Provider: Aundria Mems Other Clinician: Referring Provider: Treating Provider/Extender: Laroy Apple in Treatment: 2 Subjective Chief Complaint Information obtained from Patient 04/18/2020; patient is here for remittent  reviewable wound that is a postsurgical wound on the periumbilical abdomen. History of Present Illness (HPI) ADMISSION 04/18/2020 This is a 60 year old woman who is here for review of the wound on her abdomen. She had a number of procedures done in [place] for cosmetic reasons starting with a abdominal plasty remotely. More recently she had a fleur-de-lis abdominoplasty with liposuction. In August 2021 she had a reverse tummy tuck with liposuction of the upper abdomen. Sometime in late October/November she developed an open area with drainage. She said the drainage was initially explosive. She has not had a lot of abdominal pain no systemic symptoms. At the suggestion of her surgeon she has been using Dermagran which is a product I am not that familiar with. I am not sure that she has made much  progress with the depth of this but certainly via pictures on her phone there is been improvement in the overall surface area of the original open area. She saw Dr. Claudia Desanctis on 01/24/2020 who suggested a period of time for conservative care before considering surgical revision. Dr. Claudia Desanctis did order a CT scan of the abdomen which was done on 02/07/2020. This showed postsurgical change of the at anterior abdominal wall with possible trace seroma along the midline incision no drainable fluid collection gastric bypass changes Past medical history; includes surgery as noted above, hyperlipidemia, gastric bypass, sleep apnea using bypass, facial cosmetic surgery, 05/02/2020 upon evaluation today patient appears to be doing well with regard to her wound in the midline umbilicus location. She has been tolerating the dressing changes without complication. Fortunately she seems to be making some progress here. Albeit slowly. There does not appear to be signs of active infection which is great news and overall very pleased in that regard. Objective Constitutional Well-nourished and well-hydrated in no acute  distress. Vitals Time Taken: 3:48 PM, Height: 62 in, Weight: 165 lbs, BMI: 30.2, Temperature: 99.2 F, Pulse: 72 bpm, Respiratory Rate: 20 breaths/min, Blood Pressure: 116/76 mmHg. Respiratory normal breathing without difficulty. Psychiatric this patient is able to make decisions and demonstrates good insight into disease process. Alert and Oriented x 3. pleasant and cooperative. General Notes: Patient's wound bed showed signs of good granulation epithelization at this point. There does not appear to be any evidence of active infection which is great news I feel like that the biggest issue she has is that she has epithelial tissue going down the sidewalls of the opening. This is that required my opinion that she allow this to cover and internally and I think she can have a somewhat of an indention or dimpling in this area. Otherwise I do not see any issues at this point. Integumentary (Hair, Skin) Wound #1 status is Open. Original cause of wound was Surgical Injury. The date acquired was: 12/27/2019. The wound has been in treatment 2 weeks. The wound is located on the Midline Umbilicus. The wound measures 0.4cm length x 0.5cm width x 0.9cm depth; 0.157cm^2 area and 0.141cm^3 volume. There is Fat Layer (Subcutaneous Tissue) exposed. There is no undermining noted, however, there is tunneling at 12:00 with a maximum distance of 1.8cm. There is a medium amount of serosanguineous drainage noted. The wound margin is distinct with the outline attached to the wound base. There is large (67-100%) red granulation within the wound bed. There is no necrotic tissue within the wound bed. Assessment Active Problems ICD-10 Disruption of external operation (surgical) wound, not elsewhere classified, subsequent encounter Unspecified open wound of abdominal wall, periumbilic region without penetration into peritoneal cavity, sequela Plan Follow-up Appointments: Return Appointment in 2 weeks. Bathing/ Shower/  Hygiene: May shower and wash wound with soap and water. WOUND #1: - Umbilicus Wound Laterality: Midline Cleanser: Soap and Water Discharge Instructions: May shower and wash wound with dial antibacterial soap and water prior to dressing change. Prim Dressing: KerraCel Ag Gelling Fiber Dressing, 2x2 in (silver alginate) ary Discharge Instructions: Apply silver alginate to wound bed,slide into tunnel Secondary Dressing: ComfortFoam Border, 3x3 in (silicone border) Discharge Instructions: Apply over primary dressing as directed. 1. I would recommend currently that we go ahead and continue with wound care measures as before and the patient is in agreement with the plan this includes the use of the silver alginate dressing which I think is doing well she is back amended to  the area. 2. I would recommend she cover this with a border foam dressing for protection and to catch the drainage. We will see patient back for reevaluation in 2 weeks here in the clinic. If anything worsens or changes patient will contact our office for additional recommendations. Electronic Signature(s) Signed: 05/02/2020 7:06:24 PM By: Worthy Keeler PA-C Entered By: Worthy Keeler on 05/02/2020 19:06:24 -------------------------------------------------------------------------------- SuperBill Details Patient Name: Date of Service: Sara Wolfe RA 05/02/2020 Medical Record Number: [id number] Patient Account Number: [id number] Date of Birth/Sex: Treating RN: 1960-06-08 (60 y.o. Elam Dutch Primary Care Provider: Aundria Mems Other Clinician: Referring Provider: Treating Provider/Extender: Laroy Apple in Treatment: 2 Diagnosis Coding ICD-10 Codes Code Description T81.31XD Disruption of external operation (surgical) wound, not elsewhere classified, subsequent encounter S31.105S Unspecified open wound of abdominal wall, periumbilic region without penetration into peritoneal  cavity, sequela Facility Procedures CPT4 Code: [id number] Description: 99213 - WOUND CARE VISIT-LEV 3 EST PT Modifier: Quantity: 1 Physician Procedures : CPT4 Code Description Modifier [id number] - WC PHYS LEVEL 3 - EST PT ICD-10 Diagnosis Description T81.31XD Disruption of external operation (surgical) wound, not elsewhere classified, subsequent encounter S31.105S Unspecified open wound of abdominal  wall, periumbilic region without penetration into peritoneal cavity, Quantity: 1 sequela Electronic Signature(s) Signed: 05/02/2020 7:06:50 PM By: Worthy Keeler PA-C Previous Signature: 05/02/2020 6:00:02 PM Version By: Baruch Gouty RN, BSN Entered By: Worthy Keeler on 05/02/2020 19:06:50

## 2020-05-05 NOTE — Progress Notes (Signed)
Sara Wolfe, Sara Wolfe ([id number]) Visit Report for 05/02/2020 Arrival Information Details Patient Name: Date of Service: Sara Wolfe RA 05/02/2020 3:30 PM Medical Record Number: [id number] Patient Account Number: [id number] Date of Birth/Sex: Treating RN: [birth year] (60 y.o. Helene Shoe, Meta.Reding Primary Care Journee Bobrowski: Aundria Mems Other Clinician: Referring Marshayla Mitschke: Treating Everet Flagg/Extender: Laroy Apple in Treatment: 2 Visit Information History Since Last Visit Added or deleted any medications: No Patient Arrived: Ambulatory Any new allergies or adverse reactions: No Arrival Time: 15:48 Had a fall or experienced change in No Accompanied By: self activities of daily living that may affect Transfer Assistance: None risk of falls: Patient Identification Verified: Yes Signs or symptoms of abuse/neglect since last visito No Secondary Verification Process Completed: Yes Hospitalized since last visit: No Patient Requires Transmission-Based Precautions: No Implantable device outside of the clinic excluding No Patient Has Alerts: No cellular tissue based products placed in the center since last visit: Has Dressing in Place as Prescribed: Yes Pain Present Now: No Electronic Signature(s) Signed: 05/02/2020 5:29:13 PM By: Deon Pilling Entered By: Deon Pilling on 05/02/2020 15:49:09 -------------------------------------------------------------------------------- Clinic Level of Care Assessment Details Patient Name: Date of Service: Sara Wolfe RA 05/02/2020 3:30 PM Medical Record Number: [id number] Patient Account Number: [id number] Date of Birth/Sex: Treating RN: December 22, 1960 (60 y.o. Elam Dutch Primary Care Ronell Duffus: Aundria Mems Other Clinician: Referring Eryka Dolinger: Treating Stevi Hollinshead/Extender: Laroy Apple in Treatment: 2 Clinic Level of Care Assessment Items TOOL 4 Quantity Score []  - 0 Use when only an EandM  is performed on FOLLOW-UP visit ASSESSMENTS - Nursing Assessment / Reassessment X- 1 10 Reassessment of Co-morbidities (includes updates in patient status) X- 1 5 Reassessment of Adherence to Treatment Plan ASSESSMENTS - Wound and Skin A ssessment / Reassessment X - Simple Wound Assessment / Reassessment - one wound 1 5 []  - 0 Complex Wound Assessment / Reassessment - multiple wounds []  - 0 Dermatologic / Skin Assessment (not related to wound area) ASSESSMENTS - Focused Assessment []  - 0 Circumferential Edema Measurements - multi extremities []  - 0 Nutritional Assessment / Counseling / Intervention []  - 0 Lower Extremity Assessment (monofilament, tuning fork, pulses) []  - 0 Peripheral Arterial Disease Assessment (using hand held doppler) ASSESSMENTS - Ostomy and/or Continence Assessment and Care []  - 0 Incontinence Assessment and Management []  - 0 Ostomy Care Assessment and Management (repouching, etc.) PROCESS - Coordination of Care X - Simple Patient / Family Education for ongoing care 1 15 []  - 0 Complex (extensive) Patient / Family Education for ongoing care X- 1 10 Staff obtains Programmer, systems, Records, T Results / Process Orders est []  - 0 Staff telephones HHA, Nursing Homes / Clarify orders / etc []  - 0 Routine Transfer to another Facility (non-emergent condition) []  - 0 Routine Hospital Admission (non-emergent condition) []  - 0 New Admissions / Biomedical engineer / Ordering NPWT Apligraf, etc. , []  - 0 Emergency Hospital Admission (emergent condition) X- 1 10 Simple Discharge Coordination []  - 0 Complex (extensive) Discharge Coordination PROCESS - Special Needs []  - 0 Pediatric / Minor Patient Management []  - 0 Isolation Patient Management []  - 0 Hearing / Language / Visual special needs []  - 0 Assessment of Community assistance (transportation, D/C planning, etc.) []  - 0 Additional assistance / Altered mentation []  - 0 Support Surface(s) Assessment  (bed, cushion, seat, etc.) INTERVENTIONS - Wound Cleansing / Measurement X - Simple Wound Cleansing - one wound 1 5 []  - 0 Complex Wound Cleansing - multiple wounds X- 1 5  Wound Imaging (photographs - any number of wounds) []  - 0 Wound Tracing (instead of photographs) X- 1 5 Simple Wound Measurement - one wound []  - 0 Complex Wound Measurement - multiple wounds INTERVENTIONS - Wound Dressings X - Small Wound Dressing one or multiple wounds 1 10 []  - 0 Medium Wound Dressing one or multiple wounds []  - 0 Large Wound Dressing one or multiple wounds X- 1 5 Application of Medications - topical []  - 0 Application of Medications - injection INTERVENTIONS - Miscellaneous []  - 0 External ear exam []  - 0 Specimen Collection (cultures, biopsies, blood, body fluids, etc.) []  - 0 Specimen(s) / Culture(s) sent or taken to Lab for analysis []  - 0 Patient Transfer (multiple staff / Civil Service fast streamer / Similar devices) []  - 0 Simple Staple / Suture removal (25 or less) []  - 0 Complex Staple / Suture removal (26 or more) []  - 0 Hypo / Hyperglycemic Management (close monitor of Blood Glucose) []  - 0 Ankle / Brachial Index (ABI) - do not check if billed separately X- 1 5 Vital Signs Has the patient been seen at the hospital within the last three years: Yes Total Score: 90 Level Of Care: New/Established - Level 3 Electronic Signature(s) Signed: 05/02/2020 6:00:02 PM By: Baruch Gouty RN, BSN Entered By: Baruch Gouty on 05/02/2020 16:58:01 -------------------------------------------------------------------------------- Encounter Discharge Information Details Patient Name: Date of Service: Sara Wolfe RA 05/02/2020 3:30 PM Medical Record Number: [id number] Patient Account Number: [id number] Date of Birth/Sex: Treating RN: 02-04-1961 (60 y.o. Tonita Phoenix, Lauren Primary Care Analyn Matusek: Aundria Mems Other Clinician: Referring Shekira Drummer: Treating Carlynn Leduc/Extender: Laroy Apple in Treatment: 2 Encounter Discharge Information Items Discharge Condition: Stable Ambulatory Status: Ambulatory Discharge Destination: Home Transportation: Private Auto Accompanied By: self Schedule Follow-up Appointment: Yes Clinical Summary of Care: Patient Declined Electronic Signature(s) Signed: 05/02/2020 5:22:03 PM By: Rhae Hammock RN Entered By: Rhae Hammock on 05/02/2020 17:05:07 -------------------------------------------------------------------------------- Lower Extremity Assessment Details Patient Name: Date of Service: Sara Wolfe RA 05/02/2020 3:30 PM Medical Record Number: [id number] Patient Account Number: [id number] Date of Birth/Sex: Treating RN: 1960-03-17 (60 y.o. Debby Bud Primary Care Stephfon Bovey: Aundria Mems Other Clinician: Referring Quinley Nesler: Treating Hampton Cost/Extender: Laroy Apple in Treatment: 2 Electronic Signature(s) Signed: 05/02/2020 5:29:13 PM By: Deon Pilling Entered By: Deon Pilling on 05/02/2020 15:49:50 -------------------------------------------------------------------------------- [place] Details Patient Name: Date of Service: Sara Wolfe RA 05/02/2020 3:30 PM Medical Record Number: [id number] Patient Account Number: [id number] Date of Birth/Sex: Treating RN: [birth year] (60 y.o. Elam Dutch Primary Care Keandra Medero: Aundria Mems Other Clinician: Referring Daley Mooradian: Treating Vera Wishart/Extender: Laroy Apple in Treatment: 2 [place] reviewed with physician Active Inactive Wound/Skin Impairment Nursing Diagnoses: Impaired tissue integrity Knowledge deficit related to ulceration/compromised skin integrity Goals: Patient/caregiver will verbalize understanding of skin care regimen Date Initiated: 04/18/2020 Target Resolution Date: 05/16/2020 Goal Status: Active Ulcer/skin  breakdown will have a volume reduction of 30% by week 4 Date Initiated: 04/18/2020 Target Resolution Date: 05/16/2020 Goal Status: Active Interventions: Assess patient/caregiver ability to obtain necessary supplies Assess patient/caregiver ability to perform ulcer/skin care regimen upon admission and as needed Assess ulceration(s) every visit Provide education on ulcer and skin care Treatment Activities: Skin care regimen initiated : 04/18/2020 Topical wound management initiated : 04/18/2020 Notes: Electronic Signature(s) Signed: 05/02/2020 6:00:02 PM By: Baruch Gouty RN, BSN Entered By: Baruch Gouty on 05/02/2020 16:29:12 -------------------------------------------------------------------------------- Pain Assessment Details Patient Name: Date of Service: Sara Wolfe RA 05/02/2020 3:30 PM Medical  Record Number: [id number] Patient Account Number: [id number] Date of Birth/Sex: Treating RN: 1960-07-27 (60 y.o. Debby Bud Primary Care Yader Criger: Aundria Mems Other Clinician: Referring Zakry Caso: Treating Datrell Dunton/Extender: Laroy Apple in Treatment: 2 Active Problems Location of Pain Severity and Description of Pain Patient Has Paino No Site Locations Rate the pain. Rate the pain. Current Pain Level: 0 Pain Management and Medication Current Pain Management: Medication: No Cold Application: No Rest: No Massage: No Activity: No T.E.N.S.: No Heat Application: No Leg drop or elevation: No Is the Current Pain Management Adequate: Adequate How does your wound impact your activities of daily livingo Sleep: No Bathing: No Appetite: No Relationship With Others: No Bladder Continence: No Emotions: No Bowel Continence: No Work: No Toileting: No Drive: No Dressing: No Hobbies: No Electronic Signature(s) Signed: 05/02/2020 5:29:13 PM By: Deon Pilling Entered By: Deon Pilling on 05/02/2020  15:49:28 -------------------------------------------------------------------------------- Patient/Caregiver Education Details Patient Name: Date of Service: Sara Wolfe RA 3/11/2022andnbsp3:30 PM Medical Record Number: [id number] Patient Account Number: [id number] Date of Birth/Gender: Treating RN: [birth year] (60 y.o. Elam Dutch Primary Care Physician: Aundria Mems Other Clinician: Referring Physician: Treating Physician/Extender: Laroy Apple in Treatment: 2 Education Assessment Education Provided To: Patient Education Topics Provided Wound/Skin Impairment: Methods: Explain/Verbal Responses: Reinforcements needed, State content correctly Electronic Signature(s) Signed: 05/02/2020 6:00:02 PM By: Baruch Gouty RN, BSN Entered By: Baruch Gouty on 05/02/2020 16:29:27 -------------------------------------------------------------------------------- Wound Assessment Details Patient Name: Date of Service: Sara Wolfe RA 05/02/2020 3:30 PM Medical Record Number: [id number] Patient Account Number: [id number] Date of Birth/Sex: Treating RN: 04-25-1960 (60 y.o. Helene Shoe, Meta.Reding Primary Care Elazar Argabright: Aundria Mems Other Clinician: Referring Caden Fatica: Treating Nivan Melendrez/Extender: Laroy Apple in Treatment: 2 Wound Status Wound Number: 1 Primary Etiology: Dehisced Wound Wound Location: Midline Umbilicus Wound Status: Open Wounding Event: Surgical Injury Comorbid History: Sleep Apnea Date Acquired: 12/27/2019 Weeks Of Treatment: 2 Clustered Wound: No Photos Wound Measurements Length: (cm) 0.4 Width: (cm) 0.5 Depth: (cm) 0.9 Area: (cm) 0.157 Volume: (cm) 0.141 % Reduction in Area: -67% % Reduction in Volume: -271.1% Epithelialization: Medium (34-66%) Tunneling: Yes Position (o'clock): 12 Maximum Distance: (cm) 1.8 Undermining: No Wound Description Classification: Full Thickness Without  Exposed Support Structures Wound Margin: Distinct, outline attached Exudate Amount: Medium Exudate Type: Serosanguineous Exudate Color: red, brown Foul Odor After Cleansing: No Slough/Fibrino No Wound Bed Granulation Amount: Large (67-100%) Exposed Structure Granulation Quality: Red Fascia Exposed: No Necrotic Amount: None Present (0%) Fat Layer (Subcutaneous Tissue) Exposed: Yes Tendon Exposed: No Muscle Exposed: No Joint Exposed: No Bone Exposed: No Treatment Notes Wound #1 (Umbilicus) Wound Laterality: Midline Cleanser Soap and Water Discharge Instruction: May shower and wash wound with dial antibacterial soap and water prior to dressing change. Peri-Wound Care Topical Primary Dressing KerraCel Ag Gelling Fiber Dressing, 2x2 in (silver alginate) Discharge Instruction: Apply silver alginate to wound bed,slide into tunnel Secondary Dressing ComfortFoam Border, 3x3 in (silicone border) Discharge Instruction: Apply over primary dressing as directed. Secured With Compression Wrap Compression Stockings Environmental education officer) Signed: 05/02/2020 5:29:13 PM By: Deon Pilling Signed: 05/05/2020 7:46:34 AM By: Sandre Kitty Entered By: Sandre Kitty on 05/02/2020 16:45:42 -------------------------------------------------------------------------------- Vitals Details Patient Name: Date of Service: Sara Wolfe RA 05/02/2020 3:30 PM Medical Record Number: [id number] Patient Account Number: [id number] Date of Birth/Sex: Treating RN: Mar 02, 1960 (60 y.o. Debby Bud Primary Care Derak Schurman: Aundria Mems Other Clinician: Referring Deidre Carino: Treating Kenzo Ozment/Extender: Laroy Apple in Treatment: 2 Vital Signs Time Taken: 15:48  Temperature (F): 99.2 Height (in): 62 Pulse (bpm): 72 Weight (lbs): 165 Respiratory Rate (breaths/min): 20 Body Mass Index (BMI): 30.2 Blood Pressure (mmHg): 116/76 Reference Range: 80 - 120 mg /  dl Electronic Signature(s) Signed: 05/02/2020 5:29:13 PM By: Deon Pilling Entered By: Deon Pilling on 05/02/2020 15:49:21

## 2020-05-09 ENCOUNTER — Encounter (HOSPITAL_BASED_OUTPATIENT_CLINIC_OR_DEPARTMENT_OTHER): Payer: 59 | Admitting: Internal Medicine

## 2020-05-15 ENCOUNTER — Other Ambulatory Visit: Payer: Self-pay

## 2020-05-15 ENCOUNTER — Ambulatory Visit (INDEPENDENT_AMBULATORY_CARE_PROVIDER_SITE_OTHER): Payer: 59 | Admitting: Sports Medicine

## 2020-05-15 VITALS — BP 99/65 | HR 93 | Temp 98.8°F

## 2020-05-15 DIAGNOSIS — N3001 Acute cystitis with hematuria: Secondary | ICD-10-CM | POA: Diagnosis not present

## 2020-05-15 DIAGNOSIS — R3915 Urgency of urination: Secondary | ICD-10-CM | POA: Diagnosis not present

## 2020-05-15 DIAGNOSIS — N3000 Acute cystitis without hematuria: Secondary | ICD-10-CM | POA: Insufficient documentation

## 2020-05-15 DIAGNOSIS — M5136 Other intervertebral disc degeneration, lumbar region: Secondary | ICD-10-CM | POA: Diagnosis not present

## 2020-05-15 DIAGNOSIS — M51369 Other intervertebral disc degeneration, lumbar region without mention of lumbar back pain or lower extremity pain: Secondary | ICD-10-CM

## 2020-05-15 LAB — POCT URINALYSIS DIP (CLINITEK)
Bilirubin, UA: NEGATIVE
Glucose, UA: NEGATIVE mg/dL
Ketones, POC UA: NEGATIVE mg/dL
Nitrite, UA: POSITIVE — AB
POC PROTEIN,UA: >=300 — AB
Spec Grav, UA: >=1.03 — AB (ref 1.010–1.025)
Urobilinogen, UA: 0.2 E.U./dL
pH, UA: 6.5 (ref 5.0–8.0)

## 2020-05-15 MED ORDER — PHENAZOPYRIDINE HCL 200 MG PO TABS: 200.0000 mg | ORAL_TABLET | Freq: Three times a day (TID) | ORAL | 0 refills | Status: AC

## 2020-05-15 MED ORDER — NITROFURANTOIN MONOHYD MACRO 100 MG PO CAPS: 100.0000 mg | ORAL_CAPSULE | Freq: Two times a day (BID) | ORAL | 0 refills | Status: DC

## 2020-05-15 NOTE — Assessment & Plan Note (Signed)
Sara Wolfe has known L4-L5 lumbar spinal stenosis and did really well after right L4-L5 transforaminal epidural approximately a year ago. Continue gabapentin but now having recurrence of right-sided back and leg pain. Ordering repeat right L4-L5 transforaminal with a 4-week follow-up to evaluate response.

## 2020-05-15 NOTE — Progress Notes (Signed)
    Procedures performed today:    None.  Independent interpretation of notes and tests performed by another provider:   None.  Brief History, Exam, Impression, and Recommendations:    Lumbar degenerative disc disease Sara Wolfe has known L4-L5 lumbar spinal stenosis and did really well after right L4-L5 transforaminal epidural approximately a year ago. Continue gabapentin but now having recurrence of right-sided back and leg pain. Ordering repeat right L4-L5 transforaminal with a 4-week follow-up to evaluate response.  Acute cystitis Several days of urinary urgency, hesitancy, frequency. Urinalysis is positive for blood, nitrites, leukocytes, sending for culture. Adding Macrobid, Pyridium, declines Diflucan as she does not get yeast infections with antibiotics. When she comes back in a month we will probably repeat her urinalysis to ensure clearance of the hematuria.    ___________________________________________ Gwen Her. [name], M.D., ABFM., CAQSM. Primary Care and [place] Instructor of [place] of [facility] of Medicine

## 2020-05-15 NOTE — Assessment & Plan Note (Signed)
Several days of urinary urgency, hesitancy, frequency. Urinalysis is positive for blood, nitrites, leukocytes, sending for culture. Adding Macrobid, Pyridium, declines Diflucan as she does not get yeast infections with antibiotics. When she comes back in a month we will probably repeat her urinalysis to ensure clearance of the hematuria.

## 2020-05-16 ENCOUNTER — Ambulatory Visit
Admission: RE | Admit: 2020-05-16 | Discharge: 2020-05-16 | Disposition: A | Discharge status: Home / Self Care | Payer: 59 | Source: Ambulatory Visit | Attending: Sports Medicine | Admitting: Sports Medicine

## 2020-05-16 DIAGNOSIS — M5136 Other intervertebral disc degeneration, lumbar region: Secondary | ICD-10-CM

## 2020-05-16 MED ORDER — METHYLPREDNISOLONE ACETATE 40 MG/ML INJ SUSP (RADIOLOG
120.0000 mg | Freq: Once | INTRAMUSCULAR | Status: AC
  Administered 2020-05-16: 120 mg via EPIDURAL

## 2020-05-16 MED ORDER — IOPAMIDOL (ISOVUE-M 200) INJECTION 41%
1.0000 mL | Freq: Once | INTRAMUSCULAR | Status: AC
  Administered 2020-05-16: 1 mL via EPIDURAL

## 2020-05-16 NOTE — Discharge Instructions (Signed)

## 2020-05-17 LAB — URINE CULTURE
MICRO NUMBER:: 11690894
SPECIMEN QUALITY:: ADEQUATE

## 2020-05-23 ENCOUNTER — Ambulatory Visit (INDEPENDENT_AMBULATORY_CARE_PROVIDER_SITE_OTHER): Payer: 59 | Admitting: Sports Medicine

## 2020-05-23 ENCOUNTER — Other Ambulatory Visit: Payer: Self-pay

## 2020-05-23 DIAGNOSIS — N3001 Acute cystitis with hematuria: Secondary | ICD-10-CM | POA: Diagnosis not present

## 2020-05-23 DIAGNOSIS — R3915 Urgency of urination: Secondary | ICD-10-CM | POA: Diagnosis not present

## 2020-05-23 LAB — POCT URINALYSIS DIP (CLINITEK)
Bilirubin, UA: NEGATIVE
Glucose, UA: NEGATIVE mg/dL
Ketones, POC UA: NEGATIVE mg/dL
Nitrite, UA: NEGATIVE
POC PROTEIN,UA: 30 — AB
Spec Grav, UA: 1.025 (ref 1.010–1.025)
Urobilinogen, UA: 0.2 E.U./dL
pH, UA: 6.5 (ref 5.0–8.0)

## 2020-05-23 MED ORDER — PHENAZOPYRIDINE HCL 200 MG PO TABS: 200.0000 mg | ORAL_TABLET | Freq: Three times a day (TID) | ORAL | 0 refills | Status: AC

## 2020-05-23 MED ORDER — CIPROFLOXACIN HCL 750 MG PO TABS: 750.0000 mg | ORAL_TABLET | Freq: Two times a day (BID) | ORAL | 0 refills | Status: AC

## 2020-05-23 NOTE — Assessment & Plan Note (Signed)
Sara Wolfe returns, she is a very pleasant 60 year old female, we treated her for an acute cystitis last week. She ended up growing out extended spectrum beta-lactamase resistant E. Coli. This was sensitive to Macrobid, she has only had a partial improvement in her symptoms, the cultures did show that the Cipro has a better MIC so we will switch to ciprofloxacin and I will do another course of Pyridium. We will culture the urine again today.

## 2020-05-23 NOTE — Progress Notes (Signed)
    Procedures performed today:    None.  Independent interpretation of notes and tests performed by another provider:   None.  Brief History, Exam, Impression, and Recommendations:    Acute cystitis Sara Wolfe returns, she is a very pleasant 60 year old female, we treated her for an acute cystitis last week. She ended up growing out extended spectrum beta-lactamase resistant E. Coli. This was sensitive to Macrobid, she has only had a partial improvement in her symptoms, the cultures did show that the Cipro has a better MIC so we will switch to ciprofloxacin and I will do another course of Pyridium. We will culture the urine again today.    ___________________________________________ Gwen Her. [name], M.D., ABFM., CAQSM. Primary Care and [place] Instructor of [place] of [facility] of Medicine

## 2020-05-25 LAB — URINE CULTURE
MICRO NUMBER:: 11723456
SPECIMEN QUALITY:: ADEQUATE

## 2020-06-12 ENCOUNTER — Ambulatory Visit: Payer: 59 | Admitting: Sports Medicine

## 2020-06-24 ENCOUNTER — Other Ambulatory Visit: Payer: Self-pay | Admitting: Sports Medicine

## 2020-06-24 DIAGNOSIS — M1812 Unilateral primary osteoarthritis of first carpometacarpal joint, left hand: Secondary | ICD-10-CM

## 2020-07-19 ENCOUNTER — Encounter: Payer: Self-pay | Admitting: Emergency Medicine

## 2020-07-19 ENCOUNTER — Emergency Department (INDEPENDENT_AMBULATORY_CARE_PROVIDER_SITE_OTHER)
Admission: EM | Admit: 2020-07-19 | Discharge: 2020-07-19 | Disposition: A | Discharge status: Home / Self Care | Payer: 59 | Source: Home / Self Care

## 2020-07-19 ENCOUNTER — Emergency Department (INDEPENDENT_AMBULATORY_CARE_PROVIDER_SITE_OTHER): Payer: 59

## 2020-07-19 DIAGNOSIS — N3001 Acute cystitis with hematuria: Secondary | ICD-10-CM

## 2020-07-19 DIAGNOSIS — Z87448 Personal history of other diseases of urinary system: Secondary | ICD-10-CM | POA: Diagnosis not present

## 2020-07-19 DIAGNOSIS — R310 Gross hematuria: Secondary | ICD-10-CM

## 2020-07-19 DIAGNOSIS — R300 Dysuria: Secondary | ICD-10-CM

## 2020-07-19 LAB — POCT URINALYSIS DIP (MANUAL ENTRY)
Glucose, UA: NEGATIVE mg/dL
Ketones, POC UA: NEGATIVE mg/dL
Nitrite, UA: NEGATIVE
Protein Ur, POC: 30 mg/dL — AB
Spec Grav, UA: 1.01 (ref 1.010–1.025)
Urobilinogen, UA: 0.2 E.U./dL
pH, UA: 6.5 (ref 5.0–8.0)

## 2020-07-19 MED ORDER — PHENAZOPYRIDINE HCL 200 MG PO TABS: 200.0000 mg | ORAL_TABLET | Freq: Three times a day (TID) | ORAL | 0 refills | Status: AC

## 2020-07-19 MED ORDER — CIPROFLOXACIN HCL 750 MG PO TABS: 750.0000 mg | ORAL_TABLET | Freq: Two times a day (BID) | ORAL | 0 refills | Status: AC

## 2020-07-19 NOTE — ED Triage Notes (Addendum)
Dysuria x 2 days Hx of Uti's in the last 3 months  No AZO  No hx of kidney stones  Pt's brother has a chronic hx of kidney stones

## 2020-07-19 NOTE — ED Provider Notes (Signed)
Sara Wolfe CARE    CSN: [id number] Arrival date & time: 07/19/20  0093      History   Chief Complaint Chief Complaint  Patient presents with  . Dysuria    HPI Novice Vrba is a 60 y.o. female.   HPI Pleasant 60 year old female presents with dysuria for 2 days.  PMH significant for acute cystitis had 3 UTIs in the past 3 months.  Patient reports drinking 5 diet Pepsi's per day and very little water intake on a daily basis.  Past Medical History:  Diagnosis Date  . Gallstones   . GERD (gastroesophageal reflux disease)    history of prior to gastric bypass  . Hot flashes, menopausal   . Medical history non-contributory   . Migraine   . OSA on CPAP   . PONV (postoperative nausea and vomiting)   . Rectocele 02/13/2018  . Ventral hernia 2018   Small, noted on CT Abd/pel  . Vitamin B 12 deficiency     Patient Active Problem List   Diagnosis Date Noted  . Acute cystitis 05/15/2020  . Viral respiratory infection 09/19/2019  . Skin rash 08/15/2019  . Mood disorder ([place]) 07/18/2019  . Primary osteoarthritis of first carpometacarpal joint of left hand 07/18/2019  . Osteoporosis 01/31/2019  . H/O abdominoplasty 11/08/2018  . Hypocalcemia 09/09/2018  . Syncope 09/08/2018  . Metabolic syndrome 81/82/9937  . Benign thyroid nodule 07/03/2018  . History of laparoscopic cholecystectomy 06/27/2018  . Costochondritis 06/27/2018  . Gastritis noted on EGD 06/27/2018  . Mass of left side of neck 06/09/2018  . Rectocele 01/11/2018  . Decreased libido 01/11/2018  . Eyelash scantiness 08/08/2017  . History of facelift 06/13/2017  . Memory change 02/07/2017  . Dysarthria 01/17/2017  . Vitamin B 12 deficiency 12/17/2016  . Hiatal hernia 03/04/2016  . Chronic pain syndrome 10/16/2015  . Hyperlipidemia 05/20/2015  . Migraine headache 05/16/2015  . Obstructive sleep apnea 05/16/2015  . Lateral epicondylitis of left elbow 05/09/2015  . Lumbar degenerative disc disease  04/11/2014  . Annual physical exam 02/21/2014  . 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 S/P cervical spinal fusion 10/04/2013  . Hot flash, menopausal 12/27/2011    Past Surgical History:  Procedure Laterality Date  . ANTERIOR CERVICAL DECOMP/DISCECTOMY FUSION N/A 10/04/2013   Procedure: ANTERIOR CERVICAL DECOMPRESSION/DISCECTOMY FUSION 3 LEVELS;  Surgeon: Sinclair Ship, MD;  Location: [place];  Service: Orthopedics;  Laterality: N/A;  Anterior cervical decompression fusion cervical 4-5, cervical 5-6, cervical 6-7 with instrumentation and allograft  . ANTERIOR CERVICAL DECOMP/DISCECTOMY FUSION N/A 04/11/2014   Procedure: ANTERIOR CERVICAL DECOMPRESSION/DISCECTOMY FUSION 1 LEVEL;  Surgeon: Sinclair Ship, MD;  Location: [place];  Service: Orthopedics;  Laterality: N/A;  Anterior cervical decompression fusion, cervical 7-thoracic 1 with instrumentation and allograft  . CHOLECYSTECTOMY N/A 03/06/2018   Procedure: LAPAROSCOPIC CHOLECYSTECTOMY;  Surgeon: Greer Pickerel, MD;  Location[facility];  Service: General;  Laterality: N/A;  . COLONOSCOPY  2017  . FACIAL COSMETIC SURGERY    . FOOT SURGERY Bilateral   . GASTRIC BYPASS    . NECK EXPLORATION  2014, 2015  . TUBAL LIGATION    . UPPER GI ENDOSCOPY  2017    OB History    Gravida  2   Para  2   Term  2   Preterm      AB      Living  2     SAB      IAB      Ectopic  Multiple      Live Births               Home Medications    Prior to Admission medications   Medication Sig Start Date End Date Taking? Authorizing Provider  ciprofloxacin (CIPRO) 750 MG tablet Take 1 tablet (750 mg total) by mouth 2 (two) times daily for 7 days. 07/19/20 07/26/20 Yes Eliezer Lofts, FNP  phenazopyridine (PYRIDIUM) 200 MG tablet Take 1 tablet (200 mg total) by mouth 3 (three) times daily for 5 days. 07/19/20 07/24/20 Yes Eliezer Lofts, FNP  alendronate (FOSAMAX) 70 MG tablet TAKE 1 TABLET (70 MG TOTAL) BY MOUTH EVERY 7 (SEVEN)  DAYS. 11/05/19   Silverio Decamp, MD  AMBULATORY NON FORMULARY MEDICATION Continuous positive airway pressure (CPAP) machine autopap 5-21 cm of H2O pressure (or autoPAP if available), with all supplemental supplies as needed.  To [employer]. AHI=15 12/16/16   Silverio Decamp, MD  amitriptyline (ELAVIL) 50 MG tablet TAKE 1 TABLET BY MOUTH EVERYDAY AT BEDTIME 04/22/20   Silverio Decamp, MD  B-D UF III MINI PEN NEEDLES 31G X 5 MM MISC Inject into the skin as directed. 07/12/19   [provider]  bimatoprost (LATISSE) 0.03 % ophthalmic solution Place 1gtt on applicator and apply evenly along the skin of the upper eyelid at base of eyelashes qhs; use new applicator for 2nd eye Patient not taking: Reported on 01/03/2020 10/31/18   Silverio Decamp, MD  DULoxetine (CYMBALTA) 60 MG capsule TAKE 1 CAPSULE BY MOUTH EVERY DAY 02/19/20   Silverio Decamp, MD  folic acid (FOLVITE) 1 MG tablet TAKE 5 TABLETS (5 MG TOTAL) BY MOUTH DAILY. Patient not taking: Reported on 01/03/2020 02/21/17   Silverio Decamp, MD  gabapentin (NEURONTIN) 300 MG capsule 1 CAP AT BEDTIME X7DAYS, 1 CAP 2XDAILY X7DAYS, 1 CAP 3XDAILY. MAY DOUBLE WEEKLY TO MAX 3600MG /DAY 02/02/20   Silverio Decamp, MD  gabapentin (NEURONTIN) 600 MG tablet Take 1 tablet (600 mg total) by mouth 3 (three) times daily. 08/15/19   Silverio Decamp, MD  ibuprofen (ADVIL) 800 MG tablet TAKE 1 TABLET BY MOUTH EVERY 8 HOURS AS NEEDED 06/24/20   Silverio Decamp, MD  methocarbamol (ROBAXIN) 500 MG tablet Take 1 tablet (500 mg total) by mouth 3 (three) times daily. 05/02/20   Silverio Decamp, MD  nitrofurantoin, macrocrystal-monohydrate, (MACROBID) 100 MG capsule Take 1 capsule (100 mg total) by mouth 2 (two) times daily. Patient not taking: Reported on 07/19/2020 05/15/20   Silverio Decamp, MD  nitroGLYCERIN (NITROSTAT) 0.4 MG SL tablet Place 1 tablet (0.4 mg total) under the tongue every 5 (five) minutes  as needed for chest pain. 06/16/18   Darlyne Russian, MD  pregabalin (LYRICA) 200 MG capsule TAKE 1 CAPSULE BY MOUTH TWO TO THREE TIMES DAILY 05/02/20   Silverio Decamp, MD  rizatriptan (MAXALT-MLT) 10 MG disintegrating tablet TAKE 1 TABLET (10 MG TOTAL) BY MOUTH AS NEEDED FOR MIGRAINE. MAY REPEAT IN 2 HOURS IF NEEDED 02/21/16   Silverio Decamp, MD  SAXENDA 18 MG/3ML SOPN Inject into the skin. 09/03/19   [provider]  scopolamine (TRANSDERM-SCOP, 1.5 MG,) 1 MG/3DAYS Place 1 patch (1.5 mg total) onto the skin every 3 (three) days. 02/06/20   Silverio Decamp, MD  traMADol (ULTRAM) 50 MG tablet Take 1 tablet (50 mg total) by mouth every 8 (eight) hours as needed. for pain 05/02/20   Silverio Decamp, MD  triamcinolone cream (KENALOG) 0.5 %  Apply 1 application topically 2 (two) times daily. To affected areas. 08/15/19   Silverio Decamp, MD    Family History Family History  Problem Relation Age of Onset  . Healthy Mother   . Healthy Father   . Colon cancer Sister     Social History Social History   Tobacco Use  . Smoking status: Light Tobacco Smoker    Packs/day: 0.25    Years: 2.00    Pack years: 0.50    Types: Cigarettes  . Smokeless tobacco: Former Systems developer    Quit date: 08/03/2018  Vaping Use  . Vaping Use: Never used  Substance Use Topics  . Alcohol use: No  . Drug use: No     Allergies   Patient has no known allergies.   Review of Systems Review of Systems  Constitutional: Negative.   HENT: Negative.   Eyes: Negative.   Respiratory: Negative.   Cardiovascular: Negative.   Gastrointestinal: Negative.   Genitourinary: Positive for dysuria, hematuria and urgency.  Musculoskeletal: Negative.   Skin: Negative.   Neurological: Negative.      Physical Exam Triage Vital Signs ED Triage Vitals  Enc Vitals Group     BP 07/19/20 0844 113/68     Pulse Rate 07/19/20 0844 67     Resp 07/19/20 0844 15     Temp 07/19/20 0844 98.8 F  (37.1 C)     Temp Source 07/19/20 0844 Oral     SpO2 07/19/20 0844 97 %     Weight --      Height --      Head Circumference --      Peak Flow --      Pain Score 07/19/20 0850 4     Pain Loc --      Pain Edu? --      Excl. in [place]? --    No data found.  Updated Vital Signs BP 113/68 (BP Location: Right Arm)   Pulse 67   Temp 98.8 F (37.1 C) (Oral)   Resp 15   SpO2 97%      Physical Exam Vitals and nursing note reviewed.  Constitutional:      General: She is not in acute distress.    Appearance: Normal appearance. She is normal weight. She is not ill-appearing.  HENT:     Head: Normocephalic and atraumatic.     Mouth/Throat:     Mouth: Mucous membranes are moist.     Pharynx: Oropharynx is clear.  Eyes:     Extraocular Movements: Extraocular movements intact.     Conjunctiva/sclera: Conjunctivae normal.     Pupils: Pupils are equal, round, and reactive to light.  Cardiovascular:     Rate and Rhythm: Normal rate and regular rhythm.     Pulses: Normal pulses.     Heart sounds: Normal heart sounds.  Pulmonary:     Effort: Pulmonary effort is normal. No respiratory distress.     Breath sounds: Normal breath sounds. No wheezing, rhonchi or rales.  Abdominal:     General: Bowel sounds are normal. There is no distension.     Palpations: Abdomen is soft. There is no mass.     Tenderness: There is no abdominal tenderness. There is no right CVA tenderness, left CVA tenderness, guarding or rebound.     Comments: No hepatosplenomegaly  Musculoskeletal:        General: Normal range of motion.     Cervical back: Normal range of motion and neck supple. No  tenderness.  Lymphadenopathy:     Cervical: No cervical adenopathy.  Skin:    General: Skin is warm and dry.  Neurological:     General: No focal deficit present.     Mental Status: She is alert and oriented to person, place, and time.  Psychiatric:        Mood and Affect: Mood normal.        Behavior: Behavior normal.       UC Treatments / Results  Labs (all labs ordered are listed, but only abnormal results are displayed) Labs Reviewed  POCT URINALYSIS DIP (MANUAL ENTRY) - Abnormal; Notable for the following components:      Result Value   Clarity, UA turbid (*)    Bilirubin, UA small (*)    Blood, UA large (*)    Protein Ur, POC =30 (*)    Leukocytes, UA Large (3+) (*)    All other components within normal limits  URINE CULTURE    EKG   Radiology CT Renal Stone Study  Result Date: 07/19/2020 CLINICAL DATA:  Hematuria of unknown cause.  Recurrent UTIs. EXAM: CT ABDOMEN AND PELVIS WITHOUT CONTRAST TECHNIQUE: Multidetector CT imaging of the abdomen and pelvis was performed following the standard protocol without IV contrast. COMPARISON:  02/07/2020 FINDINGS: Lower chest: Clear lung bases.  Heart normal in size. Hepatobiliary: No focal liver abnormality is seen. Status post cholecystectomy. No biliary dilatation. Pancreas: Unremarkable. No pancreatic ductal dilatation or surrounding inflammatory changes. Spleen: Normal in size without focal abnormality. Adrenals/Urinary Tract: No adrenal masses. Kidneys normal in size, orientation and position. No renal masses or stones. No hydronephrosis. Normal ureters. Bladder minimally distended. No convincing wall thickening. No mass or stone. Stomach/Bowel: Previous gastric bypass. Stomach empties into a gastrojejunostomy. No gastric distension, wall thickening or inflammation. Small bowel and colon are normal in caliber. No wall thickening. No inflammation. Normal appendix visualized. Vascular/Lymphatic: No significant vascular findings are present. No enlarged abdominal or pelvic lymph nodes. Reproductive: Contour bulge from the uterine fundus consistent with a subserosal fibroid, approximately 1.9 cm in size and unchanged from the prior study. Uterus otherwise unremarkable. No ovarian/adnexal masses. Other: Anterior abdominal midline incision is stable. No hernia.  No ascites. Musculoskeletal: No fracture or acute finding. Disc and facet degenerative changes at L4-L5 with a grade 1 anterolisthesis. No bone lesions. IMPRESSION: 1. No acute findings. No findings to account for hematuria. The kidneys and ureters are normal. Bladder is only minimally distended, but demonstrates no evidence of a mass or wall thickening. 2. Previous changes from a gastric bypass procedure as well as a cholecystectomy, stable from the prior CT. Electronically Signed   By: Lajean Manes M.D.   On: 07/19/2020 10:30    Procedures Procedures (including critical care time)  Medications Ordered in UC Medications - No data to display  Initial Impression / Assessment and Plan / UC Course  I have reviewed the triage vital signs and the nursing notes.  Pertinent labs & imaging results that were available during my care of the patient were reviewed by me and considered in my medical decision making (see chart for details).     MDM: 1.  Hematuria, 2.  Acute cystitis, 3.  Dysuria.  Advised patient today CT of abdomen and pelvis did not reveal source of blood in urine and advised her to follow-up with PCP for further evaluation.  Patient treated today with Cipro and urine culture ordered.  Patient discharged home, hemodynamically stable. Final Clinical Impressions(s) / UC Diagnoses  Final diagnoses:  Hematuria, gross  Acute cystitis with hematuria  Dysuria     Discharge Instructions     Advised patient to take medication as directed to completion, encouraged patient to increase daily water intake while taking this medication, advised patient we will follow-up with urine culture results once returned.  Patient to follow-up with PCP or here if symptoms worsen and/or unresolved.    ED Prescriptions    Medication Sig Dispense Auth. Provider   ciprofloxacin (CIPRO) 750 MG tablet Take 1 tablet (750 mg total) by mouth 2 (two) times daily for 7 days. 14 tablet Eliezer Lofts, FNP    phenazopyridine (PYRIDIUM) 200 MG tablet Take 1 tablet (200 mg total) by mouth 3 (three) times daily for 5 days. 15 tablet Eliezer Lofts, FNP     PDMP not reviewed this encounter.   Eliezer Lofts, [place] 07/19/20 1118

## 2020-07-19 NOTE — Discharge Instructions (Signed)
Advised patient to take medication as directed to completion, encouraged patient to increase daily water intake while taking this medication, advised patient we will follow-up with urine culture results once returned.  Patient to follow-up with PCP or here if symptoms worsen and/or unresolved.

## 2020-07-22 LAB — URINE CULTURE
MICRO NUMBER:: 11948556
Result:: NO GROWTH
SPECIMEN QUALITY:: ADEQUATE

## 2020-08-01 ENCOUNTER — Other Ambulatory Visit: Payer: Self-pay | Admitting: Sports Medicine

## 2020-08-01 DIAGNOSIS — M1812 Unilateral primary osteoarthritis of first carpometacarpal joint, left hand: Secondary | ICD-10-CM

## 2020-09-01 ENCOUNTER — Other Ambulatory Visit: Payer: Self-pay | Admitting: Sports Medicine

## 2020-09-01 DIAGNOSIS — M5416 Radiculopathy, lumbar region: Secondary | ICD-10-CM

## 2020-09-01 DIAGNOSIS — M51369 Other intervertebral disc degeneration, lumbar region without mention of lumbar back pain or lower extremity pain: Secondary | ICD-10-CM

## 2020-09-01 DIAGNOSIS — M5136 Other intervertebral disc degeneration, lumbar region: Secondary | ICD-10-CM

## 2020-09-04 DIAGNOSIS — Z981 Arthrodesis status: Secondary | ICD-10-CM

## 2020-09-05 MED ORDER — DULOXETINE HCL 60 MG PO CPEP: ORAL_CAPSULE | ORAL | 1 refills | Status: DC

## 2020-10-03 ENCOUNTER — Other Ambulatory Visit: Payer: Self-pay | Admitting: Sports Medicine

## 2020-10-14 ENCOUNTER — Ambulatory Visit: Payer: 59 | Admitting: Sports Medicine

## 2020-11-24 ENCOUNTER — Other Ambulatory Visit: Payer: Self-pay | Admitting: Sports Medicine

## 2020-11-26 ENCOUNTER — Other Ambulatory Visit: Payer: Self-pay | Admitting: Sports Medicine

## 2020-11-26 DIAGNOSIS — M1812 Unilateral primary osteoarthritis of first carpometacarpal joint, left hand: Secondary | ICD-10-CM

## 2020-12-28 ENCOUNTER — Other Ambulatory Visit: Payer: Self-pay | Admitting: Sports Medicine

## 2020-12-28 DIAGNOSIS — M5416 Radiculopathy, lumbar region: Secondary | ICD-10-CM

## 2021-01-18 ENCOUNTER — Other Ambulatory Visit: Payer: Self-pay | Admitting: Sports Medicine

## 2021-01-18 DIAGNOSIS — M810 Age-related osteoporosis without current pathological fracture: Secondary | ICD-10-CM

## 2021-01-24 ENCOUNTER — Other Ambulatory Visit: Payer: Self-pay | Admitting: Sports Medicine

## 2021-01-24 DIAGNOSIS — M1812 Unilateral primary osteoarthritis of first carpometacarpal joint, left hand: Secondary | ICD-10-CM

## 2021-01-27 ENCOUNTER — Other Ambulatory Visit: Payer: Self-pay | Admitting: Sports Medicine

## 2021-01-27 DIAGNOSIS — M5416 Radiculopathy, lumbar region: Secondary | ICD-10-CM

## 2021-01-28 NOTE — Telephone Encounter (Signed)
Patient aware that prescriptions were sent to pharmacy.

## 2021-02-11 NOTE — Progress Notes (Deleted)
Last pap- 01/10/18- negative Mam scheduled for 02/19/21

## 2021-02-12 ENCOUNTER — Ambulatory Visit: Payer: 59 | Admitting: Obstetrics & Gynecology

## 2021-02-17 ENCOUNTER — Other Ambulatory Visit (HOSPITAL_BASED_OUTPATIENT_CLINIC_OR_DEPARTMENT_OTHER): Payer: Self-pay | Admitting: Sports Medicine

## 2021-02-17 DIAGNOSIS — Z1231 Encounter for screening mammogram for malignant neoplasm of breast: Secondary | ICD-10-CM

## 2021-02-19 ENCOUNTER — Other Ambulatory Visit: Payer: Self-pay

## 2021-02-19 ENCOUNTER — Encounter: Payer: Self-pay | Admitting: Sports Medicine

## 2021-02-19 ENCOUNTER — Ambulatory Visit (INDEPENDENT_AMBULATORY_CARE_PROVIDER_SITE_OTHER): Payer: 59 | Admitting: Sports Medicine

## 2021-02-19 ENCOUNTER — Ambulatory Visit (INDEPENDENT_AMBULATORY_CARE_PROVIDER_SITE_OTHER): Payer: 59

## 2021-02-19 VITALS — BP 122/73 | HR 69 | Ht 62.0 in | Wt 175.0 lb

## 2021-02-19 DIAGNOSIS — Z1231 Encounter for screening mammogram for malignant neoplasm of breast: Secondary | ICD-10-CM | POA: Diagnosis not present

## 2021-02-19 DIAGNOSIS — E8881 Metabolic syndrome: Secondary | ICD-10-CM

## 2021-02-19 DIAGNOSIS — M79645 Pain in left finger(s): Secondary | ICD-10-CM

## 2021-02-19 DIAGNOSIS — Z23 Encounter for immunization: Secondary | ICD-10-CM

## 2021-02-19 DIAGNOSIS — M79644 Pain in right finger(s): Secondary | ICD-10-CM

## 2021-02-19 DIAGNOSIS — M180 Bilateral primary osteoarthritis of first carpometacarpal joints: Secondary | ICD-10-CM

## 2021-02-19 DIAGNOSIS — Z9889 Other specified postprocedural states: Secondary | ICD-10-CM

## 2021-02-19 DIAGNOSIS — Z0000 Encounter for general adult medical examination without abnormal findings: Secondary | ICD-10-CM | POA: Diagnosis not present

## 2021-02-19 DIAGNOSIS — E785 Hyperlipidemia, unspecified: Secondary | ICD-10-CM

## 2021-02-19 MED ORDER — SAXENDA 18 MG/3ML ~~LOC~~ SOPN: PEN_INJECTOR | SUBCUTANEOUS | 11 refills | Status: DC

## 2021-02-19 MED ORDER — CELECOXIB 100 MG PO CAPS: ORAL_CAPSULE | ORAL | 11 refills | Status: DC

## 2021-02-19 NOTE — Addendum Note (Signed)
Addended by: Dema Severin on: 02/19/2021 11:23 AM   Modules accepted: Orders

## 2021-02-19 NOTE — Assessment & Plan Note (Signed)
History of an abdominoplasty, does have some scarring along the midline, no palpable or visible hernias on Valsalva, I have advised massaging the scar regularly, as well as working on strengthening her rectus abdominis which will decrease the prominence of the scar.

## 2021-02-19 NOTE — Assessment & Plan Note (Signed)
Annual physical as above, up-to-date on cervical cancer screening, breast cancer screening was done this morning. Flu shot already done, Shingrix #1 and Tdap today. Ordering routine labs.

## 2021-02-19 NOTE — Assessment & Plan Note (Signed)
Restarting Saxenda per patient request.

## 2021-02-19 NOTE — Assessment & Plan Note (Signed)
Increasing pain at the [facility] joints bilaterally, we did discuss this about a year and a half ago, ibuprofen helped, switching to Celebrex, getting bilateral x-rays, CMC exercises given, thumb loop recommended. Turn to see me in 6 weeks, injection if no better.

## 2021-02-19 NOTE — Progress Notes (Signed)
Subjective:    CC: Annual Physical Exam  HPI:  This patient is here for their annual physical  I reviewed the past medical history, family history, social history, surgical history, and allergies today and no changes were needed.  Please see the problem list section below in epic for further details.  Past Medical History: Past Medical History:  Diagnosis Date   Gallstones    GERD (gastroesophageal reflux disease)    history of prior to gastric bypass   Hot flashes, menopausal    Medical history non-contributory    Migraine    OSA on CPAP    PONV (postoperative nausea and vomiting)    Rectocele 02/13/2018   Ventral hernia 2018   Small, noted on CT Abd/pel   Vitamin B 12 deficiency    Past Surgical History: Past Surgical History:  Procedure Laterality Date   ANTERIOR CERVICAL DECOMP/DISCECTOMY FUSION N/A 10/04/2013   Procedure: ANTERIOR CERVICAL DECOMPRESSION/DISCECTOMY FUSION 3 LEVELS;  Surgeon: Sinclair Ship, MD;  Location: [place];  Service: Orthopedics;  Laterality: N/A;  Anterior cervical decompression fusion cervical 4-5, cervical 5-6, cervical 6-7 with instrumentation and allograft   ANTERIOR CERVICAL DECOMP/DISCECTOMY FUSION N/A 04/11/2014   Procedure: ANTERIOR CERVICAL DECOMPRESSION/DISCECTOMY FUSION 1 LEVEL;  Surgeon: Sinclair Ship, MD;  Location: [place];  Service: Orthopedics;  Laterality: N/A;  Anterior cervical decompression fusion, cervical 7-thoracic 1 with instrumentation and allograft   CHOLECYSTECTOMY N/A 03/06/2018   Procedure: LAPAROSCOPIC CHOLECYSTECTOMY;  Surgeon: Greer Pickerel, MD;  Location[facility];  Service: General;  Laterality: N/A;   COLONOSCOPY  2017   FACIAL COSMETIC SURGERY     FOOT SURGERY Bilateral    GASTRIC BYPASS     NECK EXPLORATION  2014, 2015   REDUCTION MAMMAPLASTY Bilateral 09/2019   TUBAL LIGATION     UPPER GI ENDOSCOPY  2017   Social History: Social History   Socioeconomic History   Marital status: Married     Spouse name: Not on file   Number of children: 2   Years of education: 12   Highest education level: Not on file  Occupational History   Occupation: [employer]  Tobacco Use   Smoking status: Light Smoker    Packs/day: 0.25    Years: 2.00    Pack years: 0.50    Types: Cigarettes   Smokeless tobacco: Former    Quit date: 08/03/2018  Vaping Use   Vaping Use: Never used  Substance and Sexual Activity   Alcohol use: No   Drug use: No   Sexual activity: Yes    Birth control/protection: Post-menopausal  Other Topics Concern   Not on file  Social History Narrative   Lives with husband   Caffeine use: tea daily   Right handed    Social Determinants of Health   Financial Resource Strain: Not on file  Food Insecurity: Not on file  Transportation Needs: Not on file  Physical Activity: Not on file  Stress: Not on file  Social Connections: Not on file   Family History: Family History  Problem Relation Age of Onset   Healthy Mother    Healthy Father    Colon cancer Sister    Allergies: No Known Allergies Medications: See med rec.  Review of Systems: No headache, visual changes, nausea, vomiting, diarrhea, constipation, dizziness, abdominal pain, skin rash, fevers, chills, night sweats, swollen lymph nodes, weight loss, chest pain, body aches, joint swelling, muscle aches, shortness of breath, mood changes, visual or auditory hallucinations.  Objective:  General: Well Developed, well nourished, and in no acute distress.  Neuro: Alert and oriented x3, extra-ocular muscles intact, sensation grossly intact. Cranial nerves II through XII are intact, motor, sensory, and coordinative functions are all intact. HEENT: Normocephalic, atraumatic, pupils equal round reactive to light, neck supple, no masses, no lymphadenopathy, thyroid nonpalpable. Oropharynx, nasopharynx, external ear canals are unremarkable. Skin: Warm and dry, no rashes noted.  Cardiac: Regular rate and rhythm, no  murmurs rubs or gallops.  Respiratory: Clear to auscultation bilaterally. Not using accessory muscles, speaking in full sentences.  Abdominal: Soft, nontender, nondistended, positive bowel sounds, no masses, no organomegaly.  Musculoskeletal: Shoulder, elbow, wrist, hip, knee, ankle stable, and with full range of motion.  Tenderness at the first [facility] bilaterally.  Impression and Recommendations:    The patient was counselled, risk factors were discussed, anticipatory guidance given.  Annual physical exam Annual physical as above, up-to-date on cervical cancer screening, breast cancer screening was done this morning. Flu shot already done, Shingrix #1 and Tdap today. Ordering routine labs.   H/O abdominoplasty History of an abdominoplasty, does have some scarring along the midline, no palpable or visible hernias on Valsalva, I have advised massaging the scar regularly, as well as working on strengthening her rectus abdominis which will decrease the prominence of the scar.  Metabolic syndrome Restarting Saxenda per patient request.  Primary osteoarthritis of both first carpometacarpal joints Increasing pain at the [facility] joints bilaterally, we did discuss this about a year and a half ago, ibuprofen helped, switching to Celebrex, getting bilateral x-rays, CMC exercises given, thumb loop recommended. Turn to see me in 6 weeks, injection if no better.  In addition to the physical we managed a chronic process with exacerbation with pharmacologic intervention.  ___________________________________________ Gwen Her. [name], M.D., ABFM., CAQSM. Primary Care and Sports Medicine [facility]  Adjunct Professor of [employer]

## 2021-02-20 ENCOUNTER — Other Ambulatory Visit: Payer: Self-pay | Admitting: Sports Medicine

## 2021-02-20 DIAGNOSIS — R928 Other abnormal and inconclusive findings on diagnostic imaging of breast: Secondary | ICD-10-CM

## 2021-02-21 LAB — TSH: TSH: 2.09 mIU/L (ref 0.40–4.50)

## 2021-02-21 LAB — COMPREHENSIVE METABOLIC PANEL
AG Ratio: 1.9 (calc) (ref 1.0–2.5)
ALT: 16 U/L (ref 6–29)
AST: 17 U/L (ref 10–35)
Albumin: 3.7 g/dL (ref 3.6–5.1)
Alkaline phosphatase (APISO): 60 U/L (ref 37–153)
BUN: 12 mg/dL (ref 7–25)
CO2: 25 mmol/L (ref 20–32)
Calcium: 8.7 mg/dL (ref 8.6–10.4)
Chloride: 110 mmol/L (ref 98–110)
Creat: 0.6 mg/dL (ref 0.50–1.05)
Globulin: 2 g/dL (calc) (ref 1.9–3.7)
Glucose, Bld: 86 mg/dL (ref 65–99)
Potassium: 4.8 mmol/L (ref 3.5–5.3)
Sodium: 145 mmol/L (ref 135–146)
Total Bilirubin: 0.2 mg/dL (ref 0.2–1.2)
Total Protein: 5.7 g/dL — ABNORMAL LOW (ref 6.1–8.1)

## 2021-02-21 LAB — HEMOGLOBIN A1C
Hgb A1c MFr Bld: 4.9 % of total Hgb (ref <5.7)
Mean Plasma Glucose: 94 mg/dL
eAG (mmol/L): 5.2 mmol/L

## 2021-02-21 LAB — CBC
HCT: 40.6 % (ref 35.0–45.0)
Hemoglobin: 13.6 g/dL (ref 11.7–15.5)
MCH: 31 pg (ref 27.0–33.0)
MCHC: 33.5 g/dL (ref 32.0–36.0)
MCV: 92.5 fL (ref 80.0–100.0)
MPV: 11.3 fL (ref 7.5–12.5)
Platelets: 246 10*3/uL (ref 140–400)
RBC: 4.39 10*6/uL (ref 3.80–5.10)
RDW: 12.3 % (ref 11.0–15.0)
WBC: 9.8 10*3/uL (ref 3.8–10.8)

## 2021-02-21 LAB — LIPID PANEL
Cholesterol: 203 mg/dL — ABNORMAL HIGH (ref <200)
HDL: 82 mg/dL (ref >=50)
LDL Cholesterol (Calc): 106 mg/dL (calc) — ABNORMAL HIGH
Non-HDL Cholesterol (Calc): 121 mg/dL (calc) (ref <130)
Total CHOL/HDL Ratio: 2.5 (calc) (ref <5.0)
Triglycerides: 68 mg/dL (ref <150)

## 2021-02-25 ENCOUNTER — Other Ambulatory Visit: Payer: Self-pay | Admitting: Sports Medicine

## 2021-02-25 DIAGNOSIS — M5416 Radiculopathy, lumbar region: Secondary | ICD-10-CM

## 2021-02-25 DIAGNOSIS — G894 Chronic pain syndrome: Secondary | ICD-10-CM

## 2021-02-25 NOTE — Telephone Encounter (Signed)
Patient aware prescription were sent to pharmacy.

## 2021-03-19 ENCOUNTER — Other Ambulatory Visit: Payer: Self-pay | Admitting: Sports Medicine

## 2021-03-19 DIAGNOSIS — M1812 Unilateral primary osteoarthritis of first carpometacarpal joint, left hand: Secondary | ICD-10-CM

## 2021-03-25 ENCOUNTER — Other Ambulatory Visit: Payer: Self-pay | Admitting: Sports Medicine

## 2021-03-25 DIAGNOSIS — M5416 Radiculopathy, lumbar region: Secondary | ICD-10-CM

## 2021-03-31 ENCOUNTER — Ambulatory Visit: Payer: 59

## 2021-03-31 ENCOUNTER — Other Ambulatory Visit: Payer: Self-pay

## 2021-03-31 ENCOUNTER — Ambulatory Visit
Admission: RE | Admit: 2021-03-31 | Discharge: 2021-03-31 | Disposition: A | Discharge status: Home / Self Care | Payer: 59 | Source: Ambulatory Visit | Attending: Sports Medicine | Admitting: Sports Medicine

## 2021-03-31 DIAGNOSIS — R928 Other abnormal and inconclusive findings on diagnostic imaging of breast: Secondary | ICD-10-CM

## 2021-04-15 ENCOUNTER — Other Ambulatory Visit: Payer: Self-pay | Admitting: Sports Medicine

## 2021-04-15 DIAGNOSIS — M180 Bilateral primary osteoarthritis of first carpometacarpal joints: Secondary | ICD-10-CM

## 2021-04-15 DIAGNOSIS — M5416 Radiculopathy, lumbar region: Secondary | ICD-10-CM

## 2021-04-16 NOTE — Telephone Encounter (Signed)
Patient aware that all refills were sent to pharmacy.

## 2021-04-21 ENCOUNTER — Encounter: Payer: Self-pay | Admitting: Sports Medicine

## 2021-04-22 ENCOUNTER — Other Ambulatory Visit: Payer: Self-pay

## 2021-04-22 ENCOUNTER — Ambulatory Visit: Payer: 59 | Admitting: Sports Medicine

## 2021-04-22 DIAGNOSIS — Z0000 Encounter for general adult medical examination without abnormal findings: Secondary | ICD-10-CM

## 2021-04-22 DIAGNOSIS — Z23 Encounter for immunization: Secondary | ICD-10-CM

## 2021-04-22 DIAGNOSIS — M180 Bilateral primary osteoarthritis of first carpometacarpal joints: Secondary | ICD-10-CM | POA: Diagnosis not present

## 2021-04-22 MED ORDER — CELECOXIB 200 MG PO CAPS: 200.0000 mg | ORAL_CAPSULE | Freq: Two times a day (BID) | ORAL | 11 refills | Status: DC

## 2021-04-22 NOTE — Assessment & Plan Note (Signed)
Shingrix #2 today

## 2021-04-22 NOTE — Progress Notes (Signed)
? ? ?  Procedures performed today:   ? ?None. ? ?Independent interpretation of notes and tests performed by another provider:  ? ?None. ? ?Brief History, Exam, Impression, and Recommendations:   ? ?Primary osteoarthritis of both first carpometacarpal joints ?Slightly better with 100 mg of Celebrex twice a day, still having pain, right worse than left, we will go up to 200 mg twice daily with meals. ?If insufficient improvement after a couple weeks we will inject her right trapeziometacarpal joint. ? ?Annual physical exam ?Shingrix #2 today. ? ?Chronic process with exacerbation and pharmacologic intervention ? ?___________________________________________ ?Gwen Her. [name], M.D., ABFM., CAQSM. ?Primary Care and Sports Medicine ?[place] ? ?Adjunct Instructor of Family Medicine  ?[employer] ?

## 2021-04-22 NOTE — Assessment & Plan Note (Signed)
Slightly better with 100 mg of Celebrex twice a day, still having pain, right worse than left, we will go up to 200 mg twice daily with meals. ?If insufficient improvement after a couple weeks we will inject her right trapeziometacarpal joint. ?

## 2021-04-22 NOTE — Addendum Note (Signed)
Addended by: Gust Brooms on: 04/22/2021 03:33 PM ? ? Modules accepted: Orders ? ?

## 2021-05-22 ENCOUNTER — Other Ambulatory Visit: Payer: Self-pay | Admitting: Sports Medicine

## 2021-05-25 ENCOUNTER — Other Ambulatory Visit: Payer: Self-pay | Admitting: Sports Medicine

## 2021-05-25 DIAGNOSIS — M5416 Radiculopathy, lumbar region: Secondary | ICD-10-CM

## 2021-06-16 ENCOUNTER — Ambulatory Visit (INDEPENDENT_AMBULATORY_CARE_PROVIDER_SITE_OTHER): Payer: 59 | Admitting: Sports Medicine

## 2021-06-16 DIAGNOSIS — M5416 Radiculopathy, lumbar region: Secondary | ICD-10-CM

## 2021-06-16 DIAGNOSIS — Z0000 Encounter for general adult medical examination without abnormal findings: Secondary | ICD-10-CM

## 2021-06-16 MED ORDER — TRAMADOL HCL 50 MG PO TABS: 50.0000 mg | ORAL_TABLET | Freq: Three times a day (TID) | ORAL | 1 refills | Status: DC | PRN

## 2021-06-16 MED ORDER — PREGABALIN 200 MG PO CAPS: ORAL_CAPSULE | ORAL | 1 refills | Status: DC

## 2021-06-16 NOTE — Progress Notes (Addendum)
? ? ?  Procedures performed today:   ? ?None. ? ?Independent interpretation of notes and tests performed by another provider:  ? ?None. ? ?Brief History, Exam, Impression, and Recommendations:   ? ?Annual physical exam ?FMLA paperwork filled out, refills of medications, return as needed. ? ? ? ?___________________________________________ ?Gwen Her. [name], M.D., ABFM., CAQSM. ?Primary Care and Sports Medicine ?[place] ? ?Adjunct Instructor of Family Medicine  ?[employer] ?

## 2021-06-16 NOTE — Assessment & Plan Note (Signed)
FMLA paperwork filled out, refills of medications, return as needed. ?

## 2021-08-20 ENCOUNTER — Other Ambulatory Visit: Payer: Self-pay | Admitting: Sports Medicine

## 2021-08-20 DIAGNOSIS — Z981 Arthrodesis status: Secondary | ICD-10-CM

## 2021-08-24 ENCOUNTER — Other Ambulatory Visit: Payer: Self-pay | Admitting: Sports Medicine

## 2021-09-20 ENCOUNTER — Other Ambulatory Visit: Payer: Self-pay | Admitting: Sports Medicine

## 2021-09-20 DIAGNOSIS — M1812 Unilateral primary osteoarthritis of first carpometacarpal joint, left hand: Secondary | ICD-10-CM

## 2021-09-28 ENCOUNTER — Ambulatory Visit: Payer: 59 | Admitting: Sports Medicine

## 2021-09-28 DIAGNOSIS — M51369 Other intervertebral disc degeneration, lumbar region without mention of lumbar back pain or lower extremity pain: Secondary | ICD-10-CM

## 2021-09-28 DIAGNOSIS — K2970 Gastritis, unspecified, without bleeding: Secondary | ICD-10-CM

## 2021-09-28 DIAGNOSIS — M5136 Other intervertebral disc degeneration, lumbar region: Secondary | ICD-10-CM | POA: Diagnosis not present

## 2021-09-28 MED ORDER — PANTOPRAZOLE SODIUM 40 MG PO TBEC: 40.0000 mg | DELAYED_RELEASE_TABLET | Freq: Every day | ORAL | 3 refills | Status: DC

## 2021-09-28 MED ORDER — PREDNISONE 50 MG PO TABS: ORAL_TABLET | ORAL | 0 refills | Status: DC

## 2021-09-28 NOTE — Assessment & Plan Note (Signed)
Sara Wolfe returns, she is a very pleasant 61 year old female, she has chronic axial low back pain, she had an MRI in 2020 that showed multilevel DDD, mild L4/L5 spondylolisthesis with L4-L5 bilateral facet arthrosis. A year ago she did have a lumbar epidural that provided no relief, not even temporary (although records stated good relief after right L4-L5 transforaminal epidural). Because of persistent and worsening pain we will proceed with 5 days of prednisone, I will keep her out of work for 2 weeks, we will also target the facet joints which are severely arthritic on the MRI. Return to see me in about 6 weeks.

## 2021-09-28 NOTE — Progress Notes (Signed)
    Procedures performed today:    None.  Independent interpretation of notes and tests performed by another provider:   None.  Brief History, Exam, Impression, and Recommendations:    Lumbar degenerative disc disease Sara Wolfe returns, she is a very pleasant 61 year old female, she has chronic axial low back pain, she had an MRI in 2020 that showed multilevel DDD, mild L4/L5 spondylolisthesis with L4-L5 bilateral facet arthrosis. A year ago she did have a lumbar epidural that provided no relief, not even temporary (although records stated good relief after right L4-L5 transforaminal epidural). Because of persistent and worsening pain we will proceed with 5 days of prednisone, I will keep Wolfe out of work for 2 weeks, we will also target the facet joints which are severely arthritic on the MRI. Return to see me in about 6 weeks.   Gastritis noted on EGD Historically did well with pantoprazole, increasing midepigastric pain, no symptoms or signs of GI bleed, restarting pantoprazole.    ____________________________________________ Sara Wolfe. Sara Wolfe, M.D., ABFM., CAQSM., AME. Primary Care and Sports Medicine [facility]  Adjunct Professor of [place] of [facility] of Medicine  Risk manager

## 2021-09-28 NOTE — Assessment & Plan Note (Signed)
Historically did well with pantoprazole, increasing midepigastric pain, no symptoms or signs of GI bleed, restarting pantoprazole.

## 2021-10-02 ENCOUNTER — Ambulatory Visit
Admission: RE | Admit: 2021-10-02 | Discharge: 2021-10-02 | Disposition: A | Discharge status: Home / Self Care | Payer: 59 | Source: Ambulatory Visit | Attending: Sports Medicine | Admitting: Sports Medicine

## 2021-10-02 DIAGNOSIS — M5136 Other intervertebral disc degeneration, lumbar region: Secondary | ICD-10-CM

## 2021-10-02 MED ORDER — IOPAMIDOL (ISOVUE-M 200) INJECTION 41%
1.0000 mL | Freq: Once | INTRAMUSCULAR | Status: AC
  Administered 2021-10-02: 1 mL via INTRA_ARTICULAR

## 2021-10-02 MED ORDER — METHYLPREDNISOLONE ACETATE 40 MG/ML INJ SUSP (RADIOLOG
80.0000 mg | Freq: Once | INTRAMUSCULAR | Status: AC
  Administered 2021-10-02: 80 mg via INTRA_ARTICULAR

## 2021-10-02 NOTE — Discharge Instructions (Signed)

## 2021-10-06 ENCOUNTER — Ambulatory Visit: Payer: 59 | Admitting: Sports Medicine

## 2021-10-12 ENCOUNTER — Encounter: Payer: Self-pay | Admitting: Sports Medicine

## 2021-10-13 ENCOUNTER — Encounter (INDEPENDENT_AMBULATORY_CARE_PROVIDER_SITE_OTHER): Payer: 59 | Admitting: Sports Medicine

## 2021-10-13 DIAGNOSIS — M5136 Other intervertebral disc degeneration, lumbar region: Secondary | ICD-10-CM

## 2021-10-13 DIAGNOSIS — M51369 Other intervertebral disc degeneration, lumbar region without mention of lumbar back pain or lower extremity pain: Secondary | ICD-10-CM

## 2021-10-19 ENCOUNTER — Encounter: Payer: Self-pay | Admitting: Sports Medicine

## 2021-10-20 ENCOUNTER — Encounter: Payer: Self-pay | Admitting: Sports Medicine

## 2021-10-23 ENCOUNTER — Encounter: Payer: Self-pay | Admitting: Sports Medicine

## 2021-10-27 ENCOUNTER — Other Ambulatory Visit: Payer: Self-pay | Admitting: Sports Medicine

## 2021-11-20 ENCOUNTER — Other Ambulatory Visit: Payer: Self-pay | Admitting: Neurological Surgery

## 2021-11-20 DIAGNOSIS — M4316 Spondylolisthesis, lumbar region: Secondary | ICD-10-CM

## 2021-11-27 ENCOUNTER — Ambulatory Visit
Admission: RE | Admit: 2021-11-27 | Discharge: 2021-11-27 | Disposition: A | Discharge status: Home / Self Care | Payer: 59 | Source: Ambulatory Visit | Attending: Neurological Surgery | Admitting: Neurological Surgery

## 2021-11-27 DIAGNOSIS — M4316 Spondylolisthesis, lumbar region: Secondary | ICD-10-CM

## 2021-11-27 MED ORDER — METHYLPREDNISOLONE ACETATE 40 MG/ML INJ SUSP (RADIOLOG
80.0000 mg | Freq: Once | INTRAMUSCULAR | Status: AC
  Administered 2021-11-27: 80 mg via EPIDURAL

## 2021-11-27 MED ORDER — IOPAMIDOL (ISOVUE-M 200) INJECTION 41%
1.0000 mL | Freq: Once | INTRAMUSCULAR | Status: AC
  Administered 2021-11-27: 1 mL via EPIDURAL

## 2021-11-27 NOTE — Discharge Instructions (Signed)

## 2021-11-28 ENCOUNTER — Other Ambulatory Visit: Payer: Self-pay | Admitting: Sports Medicine

## 2021-12-06 ENCOUNTER — Other Ambulatory Visit: Payer: Self-pay | Admitting: Sports Medicine

## 2021-12-20 ENCOUNTER — Other Ambulatory Visit: Payer: Self-pay | Admitting: Sports Medicine

## 2021-12-20 DIAGNOSIS — M5416 Radiculopathy, lumbar region: Secondary | ICD-10-CM

## 2021-12-20 DIAGNOSIS — Z981 Arthrodesis status: Secondary | ICD-10-CM

## 2021-12-22 ENCOUNTER — Other Ambulatory Visit: Payer: Self-pay | Admitting: Sports Medicine

## 2021-12-22 DIAGNOSIS — M1812 Unilateral primary osteoarthritis of first carpometacarpal joint, left hand: Secondary | ICD-10-CM

## 2021-12-24 ENCOUNTER — Ambulatory Visit (INDEPENDENT_AMBULATORY_CARE_PROVIDER_SITE_OTHER): Payer: 59

## 2021-12-24 ENCOUNTER — Ambulatory Visit: Payer: 59 | Admitting: Sports Medicine

## 2021-12-24 VITALS — Wt 180.0 lb

## 2021-12-24 DIAGNOSIS — K2970 Gastritis, unspecified, without bleeding: Secondary | ICD-10-CM

## 2021-12-24 DIAGNOSIS — M180 Bilateral primary osteoarthritis of first carpometacarpal joints: Secondary | ICD-10-CM | POA: Diagnosis not present

## 2021-12-24 DIAGNOSIS — Z903 Acquired absence of stomach [part of]: Secondary | ICD-10-CM | POA: Diagnosis not present

## 2021-12-24 MED ORDER — OMEPRAZOLE 40 MG PO CPDR: 40.0000 mg | DELAYED_RELEASE_CAPSULE | Freq: Two times a day (BID) | ORAL | 11 refills | Status: DC

## 2021-12-24 MED ORDER — WEGOVY 0.25 MG/0.5ML ~~LOC~~ SOAJ: 0.2500 mg | SUBCUTANEOUS | 0 refills | Status: DC

## 2021-12-24 NOTE — Assessment & Plan Note (Signed)
Obesity post sleeve gastrectomy, Saxenda ineffective, switching to [facility]. She would part of a multidisciplinary weight loss plan including calorie counting, exercise prescription.

## 2021-12-24 NOTE — Assessment & Plan Note (Signed)
This is a very pleasant 61 year old female, long history of bilateral hand pain localized at the carpal metacarpal joints, we have tried multiple modalities along the way including multiple NSAIDs, conditioning, x-rays have confirmed for [facility] osteoarthritis, at the last visit we bumped up to maximum dose Celebrex unfortunately without significant improvement, today I did bilateral first CMC injections, return to see me in 6 weeks as needed.

## 2021-12-24 NOTE — Assessment & Plan Note (Signed)
Historically has had gastritis noted on EGD, no current symptoms of GI bleed but having epigastric pain and acid reflux, mostly at night. Did not get significant improvement with tachyphylaxis with pantoprazole, over-the-counter omeprazole has helped so we will switch to omeprazole 40 mg twice daily. If insufficient improvement with omeprazole twice daily we will switch to Dexilant, if this does not work we will consider referral to gastroenterology.

## 2021-12-24 NOTE — Progress Notes (Signed)
    Procedures performed today:    Procedure: Real-time Ultrasound Guided injection of the left first carpometacarpal joint Device: Samsung HS60  Verbal informed consent obtained.  Time-out conducted.  Noted no overlying erythema, induration, or other signs of local infection.  Skin prepped in a sterile fashion.  Local anesthesia: Topical Ethyl chloride.  With sterile technique and under real time ultrasound guidance: Noted arthritic joint, 1/2 cc lidocaine, 1/2 cc kenalog 40 injected easily. Completed without difficulty  Advised to call if fevers/chills, erythema, induration, drainage, or persistent bleeding.  Images permanently stored and available for review in PACS.  Impression: Technically successful ultrasound guided injection.  Procedure: Real-time Ultrasound Guided injection of the right first carpometacarpal joint Device: Samsung HS60  Verbal informed consent obtained.  Time-out conducted.  Noted no overlying erythema, induration, or other signs of local infection.  Skin prepped in a sterile fashion.  Local anesthesia: Topical Ethyl chloride.  With sterile technique and under real time ultrasound guidance: Noted arthritic joint, 1/2 cc lidocaine, 1/2 cc kenalog 40 injected easily. Completed without difficulty  Advised to call if fevers/chills, erythema, induration, drainage, or persistent bleeding.  Images permanently stored and available for review in PACS.  Impression: Technically successful ultrasound guided injection.  Independent interpretation of notes and tests performed by another provider:   None.  Brief History, Exam, Impression, and Recommendations:    Primary osteoarthritis of both first carpometacarpal joints This is a very pleasant 61 year old female, long history of bilateral hand pain localized at the carpal metacarpal joints, we have tried multiple modalities along the way including multiple NSAIDs, conditioning, x-rays have confirmed for [facility]  osteoarthritis, at the last visit we bumped up to maximum dose Celebrex unfortunately without significant improvement, today I did bilateral first CMC injections, return to see me in 6 weeks as needed.  Gastritis noted on EGD Historically has had gastritis noted on EGD, no current symptoms of GI bleed but having epigastric pain and acid reflux, mostly at night. Did not get significant improvement with tachyphylaxis with pantoprazole, over-the-counter omeprazole has helped so we will switch to omeprazole 40 mg twice daily. If insufficient improvement with omeprazole twice daily we will switch to Dexilant, if this does not work we will consider referral to gastroenterology.  History of sleeve gastrectomy Obesity post sleeve gastrectomy, Saxenda ineffective, switching to [facility]. She would part of a multidisciplinary weight loss plan including calorie counting, exercise prescription.    ____________________________________________ Gwen Her. [name], M.D., ABFM., CAQSM., AME. Primary Care and Sports Medicine [facility]  Adjunct Professor of [place] of [facility] of Medicine  Risk manager

## 2021-12-25 ENCOUNTER — Other Ambulatory Visit: Payer: Self-pay | Admitting: Sports Medicine

## 2021-12-25 DIAGNOSIS — K2970 Gastritis, unspecified, without bleeding: Secondary | ICD-10-CM

## 2022-02-04 ENCOUNTER — Ambulatory Visit: Payer: 59 | Admitting: Sports Medicine

## 2022-02-16 ENCOUNTER — Ambulatory Visit
Admission: EM | Admit: 2022-02-16 | Discharge: 2022-02-16 | Disposition: A | Discharge status: Home / Self Care | Payer: 59 | Attending: Urgent Care | Admitting: Urgent Care

## 2022-02-16 DIAGNOSIS — R21 Rash and other nonspecific skin eruption: Secondary | ICD-10-CM | POA: Diagnosis not present

## 2022-02-16 MED ORDER — VALACYCLOVIR HCL 1 G PO TABS: 1000.0000 mg | ORAL_TABLET | Freq: Three times a day (TID) | ORAL | 0 refills | Status: AC

## 2022-02-16 MED ORDER — TRIAMCINOLONE ACETONIDE 0.5 % EX CREA: 1.0000 | TOPICAL_CREAM | Freq: Three times a day (TID) | CUTANEOUS | 0 refills | Status: DC

## 2022-02-16 NOTE — Discharge Instructions (Signed)
It is possible that your rash is a very early onset shingles. It does however also have characteristics of a dermatitis. Please start with the triamcinolone cream twice daily. If it does not improve, or if it develops into bumps with clear fluid and starts to burn, then start taking the Valtrex called in today.

## 2022-02-16 NOTE — ED Provider Notes (Signed)
[name]    CSN: [id number] Arrival date & time: 02/16/22  2263      History   Chief Complaint Chief Complaint  Patient presents with   Rash    HPI ARADHANA GIN is a 61 y.o. female.   Pleasant 61 year old female presents today due to concerns of a rash that she noticed upon awakening this morning.  She states that her daughter recently had shingles and she is concerned that that is the same.  She reports several red spots to just inferior of her umbilicus, and several more extending laterally on the right.  She states it does not itch, does not burn, does not sting.  She has noticed it for all of 3 hours.  States she went to bed last night without any signs or symptoms.  She does have animals at home, but states they are all well.  No recent travel or outdoor exposures. No treatments tried.   Rash   Past Medical History:  Diagnosis Date   Gallstones    GERD (gastroesophageal reflux disease)    history of prior to gastric bypass   Hot flashes, menopausal    Medical history non-contributory    Migraine    OSA on CPAP    PONV (postoperative nausea and vomiting)    Rectocele 02/13/2018   Ventral hernia 2018   Small, noted on CT Abd/pel   Vitamin B 12 deficiency     Patient Active Problem List   Diagnosis Date Noted   Mood disorder ([place]) 07/18/2019   Primary osteoarthritis of both first carpometacarpal joints 07/18/2019   Osteoporosis 01/31/2019   H/O abdominoplasty 33/54/5625   Metabolic syndrome 63/89/3734   Benign thyroid nodule 07/03/2018   History of laparoscopic cholecystectomy 06/27/2018   Gastritis noted on EGD 06/27/2018   History of facelift 06/13/2017   Vitamin B 12 deficiency 12/17/2016   Hiatal hernia 03/04/2016   Chronic pain syndrome 10/16/2015   Hyperlipidemia 05/20/2015   Migraine headache 05/16/2015   Obstructive sleep apnea 05/16/2015   Lumbar degenerative disc disease 04/11/2014   Annual physical exam 02/21/2014   History of  sleeve gastrectomy 02/21/2014   S/P cervical spinal fusion 10/04/2013   Hot flash, menopausal 12/27/2011    Past Surgical History:  Procedure Laterality Date   ANTERIOR CERVICAL DECOMP/DISCECTOMY FUSION N/A 10/04/2013   Procedure: ANTERIOR CERVICAL DECOMPRESSION/DISCECTOMY FUSION 3 LEVELS;  Surgeon: Sinclair Ship, MD;  Location: [facility];  Service: Orthopedics;  Laterality: N/A;  Anterior cervical decompression fusion cervical 4-5, cervical 5-6, cervical 6-7 with instrumentation and allograft   ANTERIOR CERVICAL DECOMP/DISCECTOMY FUSION N/A 04/11/2014   Procedure: ANTERIOR CERVICAL DECOMPRESSION/DISCECTOMY FUSION 1 LEVEL;  Surgeon: Sinclair Ship, MD;  Location: [facility];  Service: Orthopedics;  Laterality: N/A;  Anterior cervical decompression fusion, cervical 7-thoracic 1 with instrumentation and allograft   CHOLECYSTECTOMY N/A 03/06/2018   Procedure: LAPAROSCOPIC CHOLECYSTECTOMY;  Surgeon: Greer Pickerel, MD;  Location: [facility];  Service: General;  Laterality: N/A;   COLONOSCOPY  2017   FACIAL COSMETIC SURGERY     FOOT SURGERY Bilateral    GASTRIC BYPASS     NECK EXPLORATION  2014, 2015   REDUCTION MAMMAPLASTY Bilateral 09/2019   TUBAL LIGATION     UPPER GI ENDOSCOPY  2017    OB History     Gravida  2   Para  2   Term  2   Preterm      AB      Living  2  SAB      IAB      Ectopic      Multiple      Live Births               Home Medications    Prior to Admission medications   Medication Sig Start Date End Date Taking? Authorizing Provider  triamcinolone cream (KENALOG) 0.5 % Apply 1 Application topically 3 (three) times daily. Until resolved. Do not exceed 14 days use 02/16/22  Yes Audianna Landgren L, PA  valACYclovir (VALTREX) 1000 MG tablet Take 1 tablet (1,000 mg total) by mouth 3 (three) times daily for 7 days. 02/16/22 02/23/22 Yes Zriyah Kopplin L, PA  alendronate (FOSAMAX) 70 MG tablet TAKE 1 TABLET (70 MG TOTAL) BY MOUTH EVERY 7 (SEVEN)  DAYS. 01/19/21   Silverio Decamp, MD  AMBULATORY NON FORMULARY MEDICATION Continuous positive airway pressure (CPAP) machine autopap 5-21 cm of H2O pressure (or autoPAP if available), with all supplemental supplies as needed.  To [employer]. AHI=15 12/16/16   Silverio Decamp, MD  amitriptyline (ELAVIL) 50 MG tablet TAKE 1 TABLET BY MOUTH EVERYDAY AT BEDTIME 02/25/21   Silverio Decamp, MD  B-D UF III MINI PEN NEEDLES 31G X 5 MM MISC Inject into the skin as directed. 07/12/19   [provider]  BD PEN NEEDLE NANO 2ND GEN 32G X 4 MM MISC USE AS DIRECTED FOR DAILY INJECTIONS 11/30/21   Silverio Decamp, MD  celecoxib (CELEBREX) 200 MG capsule Take 1 capsule (200 mg total) by mouth 2 (two) times daily. 04/22/21   Silverio Decamp, MD  DULoxetine (CYMBALTA) 60 MG capsule TAKE 1 CAPSULE BY MOUTH EVERY DAY 12/21/21   Silverio Decamp, MD  gabapentin (NEURONTIN) 600 MG tablet TAKE 1 TABLET BY MOUTH THREE TIMES A DAY 10/28/21   Silverio Decamp, MD  methocarbamol (ROBAXIN) 500 MG tablet TAKE 1 TABLET BY MOUTH THREE TIMES A DAY 12/07/21   Silverio Decamp, MD  nitroGLYCERIN (NITROSTAT) 0.4 MG SL tablet Place 1 tablet (0.4 mg total) under the tongue every 5 (five) minutes as needed for chest pain. 06/16/18   Darlyne Russian, MD  omeprazole (PRILOSEC) 40 MG capsule Take 1 capsule (40 mg total) by mouth 2 (two) times daily. Take in the morning and at dinnertime. 12/24/21   Silverio Decamp, MD  predniSONE (DELTASONE) 50 MG tablet One tab PO daily for 5 days. 09/28/21   Silverio Decamp, MD  pregabalin (LYRICA) 200 MG capsule 1 CAPSULE 2-3 TIMES DAILY 12/21/21   Silverio Decamp, MD  traMADol (ULTRAM) 50 MG tablet TAKE 1 TABLET (50 MG TOTAL) BY MOUTH EVERY 8 (EIGHT) HOURS AS NEEDED. FOR PAIN 12/21/21   Silverio Decamp, MD  WEGOVY 0.25 MG/0.5ML SOAJ Inject 0.25 mg into the skin once a week. Use this dose for 1 month (4 shots) and then increase to next  higher dose. 12/24/21   Silverio Decamp, MD    Family History Family History  Problem Relation Age of Onset   Healthy Mother    Healthy Father    Colon cancer Sister     Social History Social History   Tobacco Use   Smoking status: Light Smoker    Packs/day: 0.25    Years: 2.00    Total pack years: 0.50    Types: Cigarettes   Smokeless tobacco: Former    Quit date: 08/03/2018  Vaping Use   Vaping Use: Never used  Substance Use Topics  Alcohol use: No   Drug use: No     Allergies   Patient has no known allergies.   Review of Systems Review of Systems  Skin:  Positive for rash.  As per HPI   Physical Exam Triage Vital Signs ED Triage Vitals  Enc Vitals Group     BP 02/16/22 1019 119/85     Pulse Rate 02/16/22 1019 82     Resp 02/16/22 1019 16     Temp 02/16/22 1019 98.2 F (36.8 C)     Temp Source 02/16/22 1019 Oral     SpO2 02/16/22 1019 97 %     Weight --      Height --      Head Circumference --      Peak Flow --      Pain Score 02/16/22 1018 0     Pain Loc --      Pain Edu? --      Excl. in [place]? --    No data found.  Updated Vital Signs BP 119/85 (BP Location: Left Arm)   Pulse 82   Temp 98.2 F (36.8 C) (Oral)   Resp 16   SpO2 97%   Visual Acuity Right Eye Distance:   Left Eye Distance:   Bilateral Distance:    Right Eye Near:   Left Eye Near:    Bilateral Near:     Physical Exam Vitals and nursing note reviewed.  Constitutional:      Appearance: Normal appearance.  HENT:     Head: Normocephalic and atraumatic.  Cardiovascular:     Rate and Rhythm: Normal rate.  Pulmonary:     Effort: Pulmonary effort is normal. No respiratory distress.  Abdominal:     General: There is no distension.     Palpations: Abdomen is soft.     Tenderness: There is no abdominal tenderness.  Skin:    General: Skin is warm.     Findings: Rash (papular rash noted from inferior umbilicus extending laterally on R.) present.  Neurological:      General: No focal deficit present.     Mental Status: She is alert and oriented to person, place, and time.      UC Treatments / Results  Labs (all labs ordered are listed, but only abnormal results are displayed) Labs Reviewed - No data to display  EKG   Radiology No results found.  Procedures Procedures (including critical care time)  Medications Ordered in UC Medications - No data to display  Initial Impression / Assessment and Plan / UC Course  I have reviewed the triage vital signs and the nursing notes.  Pertinent labs & imaging results that were available during my care of the patient were reviewed by me and considered in my medical decision making (see chart for details).     Nonspecific skin eruption -discussed with patient that her current rash is not diagnostic of shingles per se, but it has also only been present for a few hours.  She does not have the burning or stinging and it is not vesicular in nature, however it is present unilaterally and in a linear distribution.  I recommended patient try topical triamcinolone to the area twice daily for the next 12 to 24 hours.  If this seems to improve the rash, she should not take the Valtrex.  However, if symptoms progress or worsen, if it burns or stings, or changes into vesicular rash, start taking the Valtrex for possible developing shingles. Pt  amenable to treatment plan.   Final Clinical Impressions(s) / UC Diagnoses   Final diagnoses:  Rash and nonspecific skin eruption     Discharge Instructions      It is possible that your rash is a very early onset shingles. It does however also have characteristics of a dermatitis. Please start with the triamcinolone cream twice daily. If it does not improve, or if it develops into bumps with clear fluid and starts to burn, then start taking the Valtrex called in today.     ED Prescriptions     Medication Sig Dispense Auth. Provider   valACYclovir (VALTREX)  1000 MG tablet Take 1 tablet (1,000 mg total) by mouth 3 (three) times daily for 7 days. 21 tablet Dinesha Twiggs L, PA   triamcinolone cream (KENALOG) 0.5 % Apply 1 Application topically 3 (three) times daily. Until resolved. Do not exceed 14 days use 15 g Rahsaan Weakland L, PA      PDMP not reviewed this encounter.   Chaney Malling, [place] 02/16/22 1046

## 2022-02-16 NOTE — ED Triage Notes (Signed)
Patient presents to UC for rash located on lower abdomen noted this morning. Concerned with shingles. Denies pain or fever. States she has not applied any topical ointment on rash.

## 2022-02-17 ENCOUNTER — Telehealth: Payer: Self-pay

## 2022-02-17 NOTE — Telephone Encounter (Signed)
TCT pt to follow up from recent visit. Pt denies any additional needs at this time.  

## 2022-02-20 ENCOUNTER — Other Ambulatory Visit: Payer: Self-pay | Admitting: Sports Medicine

## 2022-02-20 DIAGNOSIS — M5416 Radiculopathy, lumbar region: Secondary | ICD-10-CM

## 2022-02-20 DIAGNOSIS — E8881 Metabolic syndrome: Secondary | ICD-10-CM

## 2022-03-16 ENCOUNTER — Telehealth: Payer: Self-pay

## 2022-03-16 NOTE — Telephone Encounter (Addendum)
Initiated Prior authorization [id number] '18MG'$ /3ML pen-injectors Via: Covermymeds Case/Key:[id number] Status: denied as of -03/16/22 Reason:Your plan only covers this drug when records with your body mass index (BMI) are sent to [place]. Your records must be provided and must show what your doctor tells [place]. We denied your request because we did not receive your records. Your request has been denied. Your doctor can send [place] any new or missing information for [place] to review. For this drug, you may have to meet other criteria. You can request the drug policy for more details. You can also request other plan documents for your review.  Notified Pt via: pt does not have Mychart, a vm was left about the denial

## 2022-03-18 ENCOUNTER — Ambulatory Visit: Payer: 59 | Admitting: Sports Medicine

## 2022-03-22 ENCOUNTER — Ambulatory Visit: Payer: 59 | Admitting: Sports Medicine

## 2022-03-22 DIAGNOSIS — M180 Bilateral primary osteoarthritis of first carpometacarpal joints: Secondary | ICD-10-CM | POA: Diagnosis not present

## 2022-03-22 DIAGNOSIS — K2970 Gastritis, unspecified, without bleeding: Secondary | ICD-10-CM | POA: Diagnosis not present

## 2022-03-22 DIAGNOSIS — M5416 Radiculopathy, lumbar region: Secondary | ICD-10-CM

## 2022-03-22 MED ORDER — METHOCARBAMOL 500 MG PO TABS: 500.0000 mg | ORAL_TABLET | Freq: Three times a day (TID) | ORAL | 2 refills | Status: DC

## 2022-03-22 MED ORDER — DEXLANSOPRAZOLE 60 MG PO CPDR: 60.0000 mg | DELAYED_RELEASE_CAPSULE | Freq: Every day | ORAL | 11 refills | Status: DC

## 2022-03-22 MED ORDER — PREGABALIN 200 MG PO CAPS: ORAL_CAPSULE | ORAL | 0 refills | Status: DC

## 2022-03-22 MED ORDER — GABAPENTIN 600 MG PO TABS: 600.0000 mg | ORAL_TABLET | Freq: Three times a day (TID) | ORAL | 5 refills | Status: DC

## 2022-03-22 MED ORDER — TRAMADOL HCL 50 MG PO TABS: 50.0000 mg | ORAL_TABLET | Freq: Three times a day (TID) | ORAL | 0 refills | Status: DC | PRN

## 2022-03-22 NOTE — Assessment & Plan Note (Signed)
Good resolution of her CMC pain with bilateral injections at the last visit. Now having some pain right third digit worse with extension of the middle finger. Exam is unrevealing, she will be sure to wear protective gloves when at work and when doing work around the house, adding some home conditioning, return to see me as needed for this, suspect flexor digitorum profundus strain.

## 2022-03-22 NOTE — Assessment & Plan Note (Signed)
Pleasant 62 year old female, history of sleeve gastrectomy, gastritis noted on EGD, we increased her omeprazole to 40 mg twice daily, unfortunately has intolerable sour brash, dyspepsia, heartburn. Switching to Dexilant, and I do think we need a referral back to gastroenterology. For insurance coverage purposes she has failed omeprazole, pantoprazole, ranitidine. She has EGD confirmed gastritis.

## 2022-03-22 NOTE — Progress Notes (Signed)
    Procedures performed today:    None.  Independent interpretation of notes and tests performed by another provider:   None.  Brief History, Exam, Impression, and Recommendations:    Gastritis noted on EGD Pleasant 62 year old female, history of sleeve gastrectomy, gastritis noted on EGD, we increased her omeprazole to 40 mg twice daily, unfortunately has intolerable sour brash, dyspepsia, heartburn. Switching to Dexilant, and I do think we need a referral back to gastroenterology. For insurance coverage purposes she has failed omeprazole, pantoprazole, ranitidine. She has EGD confirmed gastritis.  Primary osteoarthritis of both first carpometacarpal joints Good resolution of her CMC pain with bilateral injections at the last visit. Now having some pain right third digit worse with extension of the middle finger. Exam is unrevealing, she will be sure to wear protective gloves when at work and when doing work around the house, adding some home conditioning, return to see me as needed for this, suspect flexor digitorum profundus strain.    ____________________________________________ Gwen Her. [name], M.D., ABFM., CAQSM., AME. Primary Care and Sports Medicine [facility]  Adjunct Professor of [place] of [facility] of Medicine  Risk manager

## 2022-03-24 ENCOUNTER — Encounter: Payer: Self-pay | Admitting: Sports Medicine

## 2022-03-24 DIAGNOSIS — M180 Bilateral primary osteoarthritis of first carpometacarpal joints: Secondary | ICD-10-CM

## 2022-03-24 MED ORDER — CELECOXIB 200 MG PO CAPS: 200.0000 mg | ORAL_CAPSULE | Freq: Two times a day (BID) | ORAL | 11 refills | Status: DC

## 2022-03-30 ENCOUNTER — Telehealth: Payer: Self-pay

## 2022-03-30 NOTE — Telephone Encounter (Addendum)
Initiated Prior authorization [id number] 60MG dr capsules Via: Covermymeds Case/Key:[id number] Status: approved  as of 03/30/22 Reason:Authorization Expiration Date: 03/31/2023 Notified Pt via: Mychart

## 2022-04-28 ENCOUNTER — Other Ambulatory Visit: Payer: Self-pay | Admitting: Sports Medicine

## 2022-04-28 DIAGNOSIS — M180 Bilateral primary osteoarthritis of first carpometacarpal joints: Secondary | ICD-10-CM

## 2022-04-30 ENCOUNTER — Encounter: Payer: Self-pay | Admitting: Sports Medicine

## 2022-05-05 ENCOUNTER — Ambulatory Visit (INDEPENDENT_AMBULATORY_CARE_PROVIDER_SITE_OTHER): Payer: 59

## 2022-05-05 ENCOUNTER — Ambulatory Visit: Payer: 59 | Admitting: Sports Medicine

## 2022-05-05 DIAGNOSIS — M180 Bilateral primary osteoarthritis of first carpometacarpal joints: Secondary | ICD-10-CM

## 2022-05-05 MED ORDER — TRIAMCINOLONE ACETONIDE 40 MG/ML IJ SUSP
80.0000 mg | Freq: Once | INTRAMUSCULAR | Status: AC
  Administered 2022-05-05: 80 mg via INTRAMUSCULAR

## 2022-05-05 NOTE — Addendum Note (Signed)
Addended by: Tarri Glenn A on: 05/05/2022 08:54 AM   Modules accepted: Orders

## 2022-05-05 NOTE — Assessment & Plan Note (Signed)
Known for CMC osteoarthritis, last injections were in December 2023. Recurrence of pain, repeat bilateral injections, I would like to go ahead and get her plugged in with hand surgery.

## 2022-05-05 NOTE — Progress Notes (Signed)
    Procedures performed today:    Procedure: Real-time Ultrasound Guided injection of the left first carpometacarpal joint Device: Samsung HS60  Verbal informed consent obtained.  Time-out conducted.  Noted no overlying erythema, induration, or other signs of local infection.  Skin prepped in a sterile fashion.  Local anesthesia: Topical Ethyl chloride.  With sterile technique and under real time ultrasound guidance: Noted arthritic joint, 1/2 cc lidocaine, 1/2 cc kenalog 40 injected easily. Completed without difficulty  Advised to call if fevers/chills, erythema, induration, drainage, or persistent bleeding.  Images permanently stored and available for review in PACS.  Impression: Technically successful ultrasound guided injection.   Procedure: Real-time Ultrasound Guided injection of the right first carpometacarpal joint Device: Samsung HS60  Verbal informed consent obtained.  Time-out conducted.  Noted no overlying erythema, induration, or other signs of local infection.  Skin prepped in a sterile fashion.  Local anesthesia: Topical Ethyl chloride.  With sterile technique and under real time ultrasound guidance: Noted arthritic joint, 1/2 cc lidocaine, 1/2 cc kenalog 40 injected easily. Completed without difficulty  Advised to call if fevers/chills, erythema, induration, drainage, or persistent bleeding.  Images permanently stored and available for review in PACS.  Impression: Technically successful ultrasound guided injection.  Independent interpretation of notes and tests performed by another provider:   None.  Brief History, Exam, Impression, and Recommendations:    Primary osteoarthritis of both first carpometacarpal joints Known for CMC osteoarthritis, last injections were in December 2023. Recurrence of pain, repeat bilateral injections, I would like to go ahead and get Wolfe plugged in with hand surgery.    ____________________________________________ Sara Wolfe.  [name], M.D., ABFM., CAQSM., AME. Primary Care and Sports Medicine [place] [facility]  Adjunct Professor of [place] of [facility] of Medicine  Risk manager

## 2022-05-14 ENCOUNTER — Other Ambulatory Visit: Payer: Self-pay | Admitting: Sports Medicine

## 2022-05-22 ENCOUNTER — Other Ambulatory Visit: Payer: Self-pay | Admitting: Sports Medicine

## 2022-05-22 DIAGNOSIS — G894 Chronic pain syndrome: Secondary | ICD-10-CM

## 2022-06-03 ENCOUNTER — Emergency Department (HOSPITAL_COMMUNITY): Payer: 59

## 2022-06-03 ENCOUNTER — Other Ambulatory Visit: Payer: Self-pay

## 2022-06-03 ENCOUNTER — Emergency Department (HOSPITAL_COMMUNITY)
Admission: EM | Admit: 2022-06-03 | Discharge: 2022-06-03 | Discharge status: Against Medical Advice | Payer: 59 | Attending: Emergency Medicine | Admitting: Emergency Medicine

## 2022-06-03 ENCOUNTER — Encounter (HOSPITAL_COMMUNITY): Payer: Self-pay | Admitting: *Deleted

## 2022-06-03 ENCOUNTER — Encounter: Payer: Self-pay | Admitting: Sports Medicine

## 2022-06-03 DIAGNOSIS — Z5321 Procedure and treatment not carried out due to patient leaving prior to being seen by health care provider: Secondary | ICD-10-CM | POA: Diagnosis not present

## 2022-06-03 DIAGNOSIS — R079 Chest pain, unspecified: Secondary | ICD-10-CM | POA: Insufficient documentation

## 2022-06-03 LAB — CBC WITH DIFFERENTIAL/PLATELET
Abs Immature Granulocytes: 0.02 10*3/uL (ref 0.00–0.07)
Basophils Absolute: 0.1 10*3/uL (ref 0.0–0.1)
Basophils Relative: 1 %
Eosinophils Absolute: 0.2 10*3/uL (ref 0.0–0.5)
Eosinophils Relative: 2 %
HCT: 39 % (ref 36.0–46.0)
Hemoglobin: 12.8 g/dL (ref 12.0–15.0)
Immature Granulocytes: 0 %
Lymphocytes Relative: 37 %
Lymphs Abs: 2.6 10*3/uL (ref 0.7–4.0)
MCH: 30.8 pg (ref 26.0–34.0)
MCHC: 32.8 g/dL (ref 30.0–36.0)
MCV: 94 fL (ref 80.0–100.0)
Monocytes Absolute: 0.8 10*3/uL (ref 0.1–1.0)
Monocytes Relative: 11 %
Neutro Abs: 3.5 10*3/uL (ref 1.7–7.7)
Neutrophils Relative %: 49 %
Platelets: 210 10*3/uL (ref 150–400)
RBC: 4.15 MIL/uL (ref 3.87–5.11)
RDW: 14 % (ref 11.5–15.5)
WBC: 7.1 10*3/uL (ref 4.0–10.5)
nRBC: 0 % (ref 0.0–0.2)

## 2022-06-03 LAB — COMPREHENSIVE METABOLIC PANEL
ALT: 23 U/L (ref 0–44)
AST: 25 U/L (ref 15–41)
Albumin: 3.1 g/dL — ABNORMAL LOW (ref 3.5–5.0)
Alkaline Phosphatase: 59 U/L (ref 38–126)
Anion gap: 11 (ref 5–15)
BUN: 10 mg/dL (ref 8–23)
CO2: 20 mmol/L — ABNORMAL LOW (ref 22–32)
Calcium: 7.8 mg/dL — ABNORMAL LOW (ref 8.9–10.3)
Chloride: 112 mmol/L — ABNORMAL HIGH (ref 98–111)
Creatinine, Ser: 0.69 mg/dL (ref 0.44–1.00)
GFR, Estimated: >60 mL/min (ref >60)
Glucose, Bld: 91 mg/dL (ref 70–99)
Potassium: 3.4 mmol/L — ABNORMAL LOW (ref 3.5–5.1)
Sodium: 143 mmol/L (ref 135–145)
Total Bilirubin: 0.5 mg/dL (ref 0.3–1.2)
Total Protein: 5.5 g/dL — ABNORMAL LOW (ref 6.5–8.1)

## 2022-06-03 LAB — TROPONIN I (HIGH SENSITIVITY): Troponin I (High Sensitivity): 3 ng/L (ref <18)

## 2022-06-03 LAB — LIPASE, BLOOD: Lipase: 28 U/L (ref 11–51)

## 2022-06-03 NOTE — ED Notes (Signed)
Called pt x4 for VS check. No response.

## 2022-06-03 NOTE — ED Provider Triage Note (Signed)
Emergency Medicine Provider Triage Evaluation Note  Sara Wolfe , a 62 y.o. female  was evaluated in triage.  Pt complains of chest pain on the right side of her chest that lasted approximately 10 minutes and cause some shortness of breath now mostly gone.  Review of Systems  Positive: Chest pain and shortness of breath Negative: Leg pain or swelling, nausea, vomiting, cough or congestion  Physical Exam  BP 110/71 (BP Location: Left Arm)   Pulse 73   Temp 98.4 F (36.9 C) (Oral)   Resp 18   Ht 5\' 2"  (1.575 m)   Wt 78.5 kg   SpO2 93%   BMI 31.64 kg/m  Gen:   Awake, no distress   Resp:  Normal effort  MSK:   Moves extremities without difficulty  Other:    Medical Decision Making  Medically screening exam initiated at 11:55 AM.  Appropriate orders placed.  Sara Wolfe was informed that the remainder of the evaluation will be completed by another provider, this initial triage assessment does not replace that evaluation, and the importance of remaining in the ED until their evaluation is complete.  Initial EKG by EMS is normal   Gwyneth Sprout, MD 06/03/22 1156

## 2022-06-03 NOTE — ED Triage Notes (Addendum)
BIB [facility] from home with R sided CP, stabbing like, was light headed, also HA. Denies NVD, sob, LOC. Alert, NAD, calm, interactive. 20g L AC. VSS. NSR. ASA 324mg  PTA.

## 2022-06-03 NOTE — ED Notes (Signed)
EDP Dr. Plunkett at [facility] 

## 2022-06-03 NOTE — ED Notes (Signed)
Did EKG shown to er provider 

## 2022-06-07 ENCOUNTER — Telehealth: Payer: Self-pay | Admitting: General Practice

## 2022-06-07 NOTE — Transitions of Care (Post Inpatient/ED Visit) (Signed)
   06/07/2022  Name: Sara Wolfe MRN: [id number] DOB: October 26, 1960  Today's TOC FU Call Status: Today's TOC FU Call Status:: Successful TOC FU Call Competed TOC FU Call Complete Date: 06/07/22  Transition Care Management Follow-up Telephone Call Date of Discharge: 06/03/22 Discharge Facility: [facility]) Type of Discharge: Emergency Department Reason for ED Visit: Cardiac Conditions Cardiac Conditions Diagnosis: Chest Pain Persisting How have you been since you were released from the hospital?: Better Any questions or concerns?: No  Items Reviewed: Did you receive and understand the discharge instructions provided?: No Medications obtained and verified?: Yes (Medications Reviewed) Any new allergies since your discharge?: No Dietary orders reviewed?: NA Do you have support at home?: No  Home Care and Equipment/Supplies: Were Home Health Services Ordered?: NA Any new equipment or medical supplies ordered?: NA  Functional Questionnaire: Do you need assistance with bathing/showering or dressing?: No Do you need assistance with meal preparation?: No Do you need assistance with eating?: No Do you have difficulty maintaining continence: No Do you need assistance with getting out of bed/getting out of a chair/moving?: No Do you have difficulty managing or taking your medications?: No  Follow up appointments reviewed: PCP Follow-up appointment confirmed?: Yes Date of PCP follow-up appointment?: 07/02/22 Follow-up Provider: Dr. Karie Schwalbe Specialist [facility] Follow-up appointment confirmed?: NA Do you need transportation to your follow-up appointment?: No Do you understand care options if your condition(s) worsen?: Yes-patient verbalized understanding    SIGNATURE: Modesto Charon, RN BSN

## 2022-06-17 ENCOUNTER — Other Ambulatory Visit: Payer: Self-pay | Admitting: Sports Medicine

## 2022-06-17 ENCOUNTER — Encounter: Payer: Self-pay | Admitting: Sports Medicine

## 2022-06-17 DIAGNOSIS — Z981 Arthrodesis status: Secondary | ICD-10-CM

## 2022-06-17 DIAGNOSIS — Z903 Acquired absence of stomach [part of]: Secondary | ICD-10-CM

## 2022-06-17 MED ORDER — DULOXETINE HCL 60 MG PO CPEP: 60.0000 mg | ORAL_CAPSULE | Freq: Every day | ORAL | 1 refills | Status: DC

## 2022-06-22 ENCOUNTER — Other Ambulatory Visit: Payer: Self-pay | Admitting: Sports Medicine

## 2022-06-22 DIAGNOSIS — Z981 Arthrodesis status: Secondary | ICD-10-CM

## 2022-06-22 DIAGNOSIS — M180 Bilateral primary osteoarthritis of first carpometacarpal joints: Secondary | ICD-10-CM

## 2022-06-23 ENCOUNTER — Other Ambulatory Visit: Payer: Self-pay | Admitting: Sports Medicine

## 2022-06-24 MED ORDER — PREGABALIN 200 MG PO CAPS: ORAL_CAPSULE | ORAL | 0 refills | Status: DC

## 2022-07-02 ENCOUNTER — Encounter: Payer: Self-pay | Admitting: Sports Medicine

## 2022-07-02 ENCOUNTER — Ambulatory Visit: Payer: 59 | Admitting: Sports Medicine

## 2022-07-02 VITALS — BP 117/75 | HR 95 | Ht 62.0 in | Wt 174.0 lb

## 2022-07-02 DIAGNOSIS — E669 Obesity, unspecified: Secondary | ICD-10-CM | POA: Diagnosis not present

## 2022-07-02 DIAGNOSIS — R011 Cardiac murmur, unspecified: Secondary | ICD-10-CM | POA: Diagnosis not present

## 2022-07-02 DIAGNOSIS — Z903 Acquired absence of stomach [part of]: Secondary | ICD-10-CM

## 2022-07-02 DIAGNOSIS — Z9289 Personal history of other medical treatment: Secondary | ICD-10-CM | POA: Diagnosis not present

## 2022-07-02 DIAGNOSIS — G894 Chronic pain syndrome: Secondary | ICD-10-CM

## 2022-07-02 LAB — CBC: Hemoglobin: 14.8 g/dL (ref 11.7–15.5)

## 2022-07-02 MED ORDER — AMITRIPTYLINE HCL 50 MG PO TABS: 50.0000 mg | ORAL_TABLET | Freq: Every day | ORAL | 3 refills | Status: DC

## 2022-07-02 MED ORDER — WEGOVY 0.25 MG/0.5ML ~~LOC~~ SOAJ
0.2500 mg | SUBCUTANEOUS | 0 refills | Status: DC
Start: 2022-07-02 — End: 2022-08-09

## 2022-07-02 NOTE — Progress Notes (Signed)
    Procedures performed today:    None.  Independent interpretation of notes and tests performed by another provider:   None.  Brief History, Exam, Impression, and Recommendations:    Systolic murmur Sara Wolfe was in the ED with chest pain last month, see above for further details, I did notice a new systolic murmur today, she had a transthoracic echo about 4 years ago that was normal. I would like an updated echocardiogram due to new murmur.  History of normal nuclear stress test in 2020 She did present to the emergency department while developing pleuritic chest pain right-sided axillary at work, she was given some IV fluids in the ambulance on the way to the hospital and symptoms improved.  ECG, chest x-ray normal in the ED, troponins normal, abnormality was hypocalcemia. She has not had this type of pain before. No D-dimer done. Will add this today as well as checking additional labs. I think she is low risk for ACS considering her recent nuclear stress test about 4 years ago.  Obesity (BMI 30-39.9) Obesity, did not tolerate Saxenda, she will be enrolled in a multidisciplinary weight loss program with calorie counting and an exercise prescription. Refilling Wegovy.    ____________________________________________ Ihor Austin. Benjamin Stain, M.D., ABFM., CAQSM., AME. Primary Care and Sports Medicine  [facility]  Adjunct Professor of Family Medicine  [employer] [employer]  Restaurant manager, fast food

## 2022-07-02 NOTE — Assessment & Plan Note (Signed)
She did present to the emergency department while developing pleuritic chest pain right-sided axillary at work, she was given some IV fluids in the ambulance on the way to the hospital and symptoms improved.  ECG, chest x-ray normal in the ED, troponins normal, abnormality was hypocalcemia. She has not had this type of pain before. No D-dimer done. Will add this today as well as checking additional labs. I think she is low risk for ACS considering her recent nuclear stress test about 4 years ago.

## 2022-07-02 NOTE — Assessment & Plan Note (Signed)
Sara Wolfe was in the ED with chest pain last month, see above for further details, I did notice a new systolic murmur today, she had a transthoracic echo about 4 years ago that was normal. I would like an updated echocardiogram due to new murmur.

## 2022-07-02 NOTE — Assessment & Plan Note (Signed)
Obesity, did not tolerate Saxenda, she will be enrolled in a multidisciplinary weight loss program with calorie counting and an exercise prescription. Refilling [id number].

## 2022-07-03 LAB — CBC
HCT: 45.5 % — ABNORMAL HIGH (ref 35.0–45.0)
MCH: 30.5 pg (ref 27.0–33.0)
MCHC: 32.5 g/dL (ref 32.0–36.0)
MCV: 93.6 fL (ref 80.0–100.0)
MPV: 11 fL (ref 7.5–12.5)
Platelets: 257 Thousand/uL (ref 140–400)
RBC: 4.86 10*6/uL (ref 3.80–5.10)
RDW: 12.3 % (ref 11.0–15.0)
WBC: 8.1 Thousand/uL (ref 3.8–10.8)

## 2022-07-03 LAB — COMPREHENSIVE METABOLIC PANEL
AG Ratio: 1.6 (calc) (ref 1.0–2.5)
ALT: 21 U/L (ref 6–29)
AST: 17 U/L (ref 10–35)
Albumin: 3.8 g/dL (ref 3.6–5.1)
Alkaline phosphatase (APISO): 65 U/L (ref 37–153)
BUN: 13 mg/dL (ref 7–25)
CO2: 23 mmol/L (ref 20–32)
Calcium: 8.6 mg/dL (ref 8.6–10.4)
Chloride: 110 mmol/L (ref 98–110)
Creat: 0.7 mg/dL (ref 0.50–1.05)
Globulin: 2.4 g/dL (calc) (ref 1.9–3.7)
Glucose, Bld: 73 mg/dL (ref 65–99)
Potassium: 4.5 mmol/L (ref 3.5–5.3)
Sodium: 143 mmol/L (ref 135–146)
Total Bilirubin: 0.3 mg/dL (ref 0.2–1.2)
Total Protein: 6.2 g/dL (ref 6.1–8.1)

## 2022-07-03 LAB — D-DIMER, QUANTITATIVE: D-Dimer, Quant: 0.45 mcg/mL FEU (ref <0.50)

## 2022-07-22 ENCOUNTER — Encounter: Payer: Self-pay | Admitting: Sports Medicine

## 2022-07-27 ENCOUNTER — Encounter: Payer: Self-pay | Admitting: Sports Medicine

## 2022-08-09 ENCOUNTER — Telehealth: Payer: Self-pay

## 2022-08-09 ENCOUNTER — Other Ambulatory Visit: Payer: Self-pay | Admitting: Sports Medicine

## 2022-08-09 DIAGNOSIS — Z903 Acquired absence of stomach [part of]: Secondary | ICD-10-CM

## 2022-08-09 DIAGNOSIS — E669 Obesity, unspecified: Secondary | ICD-10-CM

## 2022-08-09 NOTE — Telephone Encounter (Signed)
Initiated Prior authorization ZOX:WRUEAV 0.25MG /0.5ML auto-injectors Via: Covermymeds Case/Key:[id number] Status: Pending as of 08/09/22 Reason: Notified Pt via: Mychart

## 2022-08-10 ENCOUNTER — Encounter: Payer: Self-pay | Admitting: Sports Medicine

## 2022-08-10 ENCOUNTER — Ambulatory Visit (HOSPITAL_BASED_OUTPATIENT_CLINIC_OR_DEPARTMENT_OTHER)
Admission: RE | Admit: 2022-08-10 | Discharge: 2022-08-10 | Disposition: A | Discharge status: Home / Self Care | Payer: 59 | Source: Ambulatory Visit | Attending: Sports Medicine | Admitting: Sports Medicine

## 2022-08-10 DIAGNOSIS — R011 Cardiac murmur, unspecified: Secondary | ICD-10-CM | POA: Diagnosis present

## 2022-08-10 MED ORDER — WEGOVY 0.25 MG/0.5ML ~~LOC~~ SOAJ
0.2500 mg | SUBCUTANEOUS | 0 refills | Status: DC
Start: 2022-08-10 — End: 2022-09-02

## 2022-08-11 LAB — ECHOCARDIOGRAM COMPLETE
Area-P 1/2: 4.68 cm2
S' Lateral: 2.7 cm

## 2022-08-31 ENCOUNTER — Other Ambulatory Visit: Payer: Self-pay | Admitting: Physician Assistant

## 2022-08-31 DIAGNOSIS — E669 Obesity, unspecified: Secondary | ICD-10-CM

## 2022-08-31 DIAGNOSIS — Z903 Acquired absence of stomach [part of]: Secondary | ICD-10-CM

## 2022-09-01 ENCOUNTER — Encounter: Payer: Self-pay | Admitting: Sports Medicine

## 2022-09-01 DIAGNOSIS — G894 Chronic pain syndrome: Secondary | ICD-10-CM

## 2022-09-01 DIAGNOSIS — E669 Obesity, unspecified: Secondary | ICD-10-CM

## 2022-09-01 MED ORDER — AMITRIPTYLINE HCL 50 MG PO TABS
50.0000 mg | ORAL_TABLET | Freq: Every day | ORAL | 3 refills | Status: DC
Start: 2022-09-01 — End: 2023-06-01

## 2022-09-02 ENCOUNTER — Other Ambulatory Visit: Payer: Self-pay | Admitting: Sports Medicine

## 2022-09-02 DIAGNOSIS — K2970 Gastritis, unspecified, without bleeding: Secondary | ICD-10-CM

## 2022-09-02 MED ORDER — CYCLOBENZAPRINE HCL 10 MG PO TABS: ORAL_TABLET | ORAL | 0 refills | Status: DC

## 2022-09-02 MED ORDER — WEGOVY 1.7 MG/0.75ML ~~LOC~~ SOAJ
1.7000 mg | SUBCUTANEOUS | 0 refills | Status: DC
Start: 2022-09-02 — End: 2022-09-10

## 2022-09-02 MED ORDER — WEGOVY 1 MG/0.5ML ~~LOC~~ SOAJ
1.0000 mg | SUBCUTANEOUS | 0 refills | Status: DC
Start: 2022-09-02 — End: 2022-09-10

## 2022-09-02 MED ORDER — WEGOVY 2.4 MG/0.75ML ~~LOC~~ SOAJ
2.4000 mg | SUBCUTANEOUS | 11 refills | Status: DC
Start: 2022-09-02 — End: 2022-09-10

## 2022-09-02 MED ORDER — WEGOVY 0.5 MG/0.5ML ~~LOC~~ SOAJ
0.5000 mg | SUBCUTANEOUS | 0 refills | Status: DC
Start: 2022-09-02 — End: 2022-09-10

## 2022-09-10 MED ORDER — WEGOVY 1.7 MG/0.75ML ~~LOC~~ SOAJ
1.7000 mg | SUBCUTANEOUS | 0 refills | Status: DC
Start: 2022-09-10 — End: 2022-10-14

## 2022-09-10 MED ORDER — WEGOVY 2.4 MG/0.75ML ~~LOC~~ SOAJ
2.4000 mg | SUBCUTANEOUS | 11 refills | Status: DC
Start: 2022-09-10 — End: 2022-10-14

## 2022-09-10 MED ORDER — WEGOVY 1 MG/0.5ML ~~LOC~~ SOAJ
1.0000 mg | SUBCUTANEOUS | 0 refills | Status: DC
Start: 2022-09-10 — End: 2022-10-14

## 2022-09-10 MED ORDER — WEGOVY 0.5 MG/0.5ML ~~LOC~~ SOAJ
0.5000 mg | SUBCUTANEOUS | 0 refills | Status: DC
Start: 2022-09-10 — End: 2022-09-22

## 2022-09-18 ENCOUNTER — Other Ambulatory Visit: Payer: Self-pay | Admitting: Sports Medicine

## 2022-09-21 ENCOUNTER — Other Ambulatory Visit: Payer: Self-pay | Admitting: Sports Medicine

## 2022-09-21 DIAGNOSIS — M5416 Radiculopathy, lumbar region: Secondary | ICD-10-CM

## 2022-09-21 MED ORDER — CYCLOBENZAPRINE HCL 10 MG PO TABS: ORAL_TABLET | ORAL | 3 refills | Status: DC

## 2022-09-21 NOTE — Telephone Encounter (Signed)
Routing to an available provider.  Last OV: 07/02/22 Next OV: no appt scheduled Last RF: 03/22/22

## 2022-09-22 ENCOUNTER — Other Ambulatory Visit: Payer: Self-pay | Admitting: Sports Medicine

## 2022-09-22 DIAGNOSIS — E669 Obesity, unspecified: Secondary | ICD-10-CM

## 2022-09-22 DIAGNOSIS — M5416 Radiculopathy, lumbar region: Secondary | ICD-10-CM

## 2022-09-22 MED ORDER — TRAMADOL HCL 50 MG PO TABS
50.0000 mg | ORAL_TABLET | Freq: Three times a day (TID) | ORAL | 0 refills | Status: AC | PRN
Start: 2022-09-22 — End: ?

## 2022-09-22 NOTE — Telephone Encounter (Signed)
Refilled.    Jamal Maes don't forget I'm the only provider that doesn't need coverage on vacation.

## 2022-09-23 ENCOUNTER — Encounter: Payer: Self-pay | Admitting: Pharmacist

## 2022-09-23 ENCOUNTER — Other Ambulatory Visit: Payer: Self-pay

## 2022-09-23 ENCOUNTER — Encounter: Payer: Self-pay | Admitting: Sports Medicine

## 2022-09-23 ENCOUNTER — Other Ambulatory Visit: Payer: Self-pay | Admitting: Sports Medicine

## 2022-09-23 ENCOUNTER — Other Ambulatory Visit (HOSPITAL_COMMUNITY): Payer: Self-pay

## 2022-09-23 MED ORDER — WEGOVY 0.5 MG/0.5ML ~~LOC~~ SOAJ
0.5000 mg | SUBCUTANEOUS | 0 refills | Status: DC
Start: 2022-09-23 — End: 2022-10-14
  Filled 2022-09-23: qty 2, 28d supply, fill #0

## 2022-09-23 NOTE — Telephone Encounter (Signed)
Please see message from patient.  Patient comment: [facility]. Can you resend it [employer] for delivery. Others say they get it eith ni trouble from online. Thank you!  O.k. to move to [facility] pharmacy?

## 2022-09-24 ENCOUNTER — Encounter: Payer: Self-pay | Admitting: Sports Medicine

## 2022-09-24 ENCOUNTER — Other Ambulatory Visit: Payer: Self-pay | Admitting: Sports Medicine

## 2022-09-24 NOTE — Telephone Encounter (Signed)
I called the pharmacy and they only have a 30 day supply. I called patient and she will take the 30 day supply. She will need another prescription sooner than 90 days.

## 2022-10-01 ENCOUNTER — Ambulatory Visit: Payer: 59 | Admitting: Sports Medicine

## 2022-10-07 ENCOUNTER — Ambulatory Visit: Payer: 59 | Admitting: Sports Medicine

## 2022-10-14 ENCOUNTER — Other Ambulatory Visit: Payer: Self-pay | Admitting: Sports Medicine

## 2022-10-14 ENCOUNTER — Other Ambulatory Visit: Payer: Self-pay

## 2022-10-14 ENCOUNTER — Encounter: Payer: Self-pay | Admitting: Sports Medicine

## 2022-10-14 DIAGNOSIS — E669 Obesity, unspecified: Secondary | ICD-10-CM

## 2022-10-14 MED ORDER — WEGOVY 1 MG/0.5ML ~~LOC~~ SOAJ
1.0000 mg | SUBCUTANEOUS | 0 refills | Status: DC
Start: 2022-10-14 — End: 2022-11-17
  Filled 2022-10-14: qty 2, 28d supply, fill #0

## 2022-10-18 ENCOUNTER — Encounter: Payer: Self-pay | Admitting: Sports Medicine

## 2022-10-18 DIAGNOSIS — M51369 Other intervertebral disc degeneration, lumbar region without mention of lumbar back pain or lower extremity pain: Secondary | ICD-10-CM

## 2022-10-18 DIAGNOSIS — M5136 Other intervertebral disc degeneration, lumbar region: Secondary | ICD-10-CM

## 2022-10-18 MED ORDER — METHOCARBAMOL 500 MG PO TABS
500.0000 mg | ORAL_TABLET | Freq: Three times a day (TID) | ORAL | 0 refills | Status: DC
Start: 2022-10-18 — End: 2022-11-18

## 2022-11-17 ENCOUNTER — Other Ambulatory Visit: Payer: Self-pay | Admitting: Sports Medicine

## 2022-11-17 ENCOUNTER — Other Ambulatory Visit (HOSPITAL_COMMUNITY): Payer: Self-pay

## 2022-11-17 DIAGNOSIS — E669 Obesity, unspecified: Secondary | ICD-10-CM

## 2022-11-17 MED ORDER — WEGOVY 1.7 MG/0.75ML ~~LOC~~ SOAJ
1.7000 mg | SUBCUTANEOUS | 11 refills | Status: DC
Start: 2022-11-17 — End: 2022-12-16
  Filled 2022-11-17: qty 3, 28d supply, fill #0

## 2022-11-18 ENCOUNTER — Other Ambulatory Visit: Payer: Self-pay | Admitting: Sports Medicine

## 2022-11-18 ENCOUNTER — Other Ambulatory Visit (HOSPITAL_COMMUNITY): Payer: Self-pay

## 2022-11-18 DIAGNOSIS — M5136 Other intervertebral disc degeneration, lumbar region: Secondary | ICD-10-CM

## 2022-11-19 ENCOUNTER — Other Ambulatory Visit: Payer: Self-pay | Admitting: Sports Medicine

## 2022-11-19 DIAGNOSIS — M5136 Other intervertebral disc degeneration, lumbar region: Secondary | ICD-10-CM

## 2022-11-23 ENCOUNTER — Other Ambulatory Visit: Payer: Self-pay | Admitting: Sports Medicine

## 2022-11-23 DIAGNOSIS — M5416 Radiculopathy, lumbar region: Secondary | ICD-10-CM

## 2022-11-23 MED ORDER — TRAMADOL HCL 50 MG PO TABS
50.0000 mg | ORAL_TABLET | Freq: Three times a day (TID) | ORAL | 0 refills | Status: DC | PRN
Start: 2022-11-23 — End: 2022-12-17

## 2022-11-23 MED ORDER — PREGABALIN 200 MG PO CAPS: ORAL_CAPSULE | ORAL | 0 refills | Status: DC

## 2022-12-16 ENCOUNTER — Other Ambulatory Visit (HOSPITAL_COMMUNITY): Payer: Self-pay

## 2022-12-16 ENCOUNTER — Encounter (INDEPENDENT_AMBULATORY_CARE_PROVIDER_SITE_OTHER): Payer: 59 | Admitting: Sports Medicine

## 2022-12-16 ENCOUNTER — Other Ambulatory Visit: Payer: Self-pay

## 2022-12-16 DIAGNOSIS — E669 Obesity, unspecified: Secondary | ICD-10-CM

## 2022-12-16 MED ORDER — WEGOVY 2.4 MG/0.75ML ~~LOC~~ SOAJ
2.4000 mg | SUBCUTANEOUS | 11 refills | Status: DC
Start: 2022-12-16 — End: 2023-02-04
  Filled 2022-12-16: qty 3, 28d supply, fill #0
  Filled 2023-01-12: qty 3, 28d supply, fill #1

## 2022-12-16 NOTE — Telephone Encounter (Signed)

## 2022-12-16 NOTE — Telephone Encounter (Signed)
Patient requesting wegovy strength increase to 2.4mg   to [facility] .  Last written as 1.7 on 11/17/2022 Last OV 07/02/2022 No upcoming appt schld.

## 2022-12-17 ENCOUNTER — Other Ambulatory Visit: Payer: Self-pay | Admitting: Sports Medicine

## 2022-12-17 DIAGNOSIS — M5416 Radiculopathy, lumbar region: Secondary | ICD-10-CM

## 2022-12-20 MED ORDER — PREGABALIN 200 MG PO CAPS: ORAL_CAPSULE | ORAL | 0 refills | Status: DC

## 2022-12-20 MED ORDER — TRAMADOL HCL 50 MG PO TABS
50.0000 mg | ORAL_TABLET | Freq: Three times a day (TID) | ORAL | 0 refills | Status: DC | PRN
Start: 2022-12-20 — End: 2023-03-22

## 2023-01-11 ENCOUNTER — Other Ambulatory Visit (HOSPITAL_COMMUNITY): Payer: Self-pay

## 2023-01-12 ENCOUNTER — Other Ambulatory Visit: Payer: Self-pay

## 2023-02-04 ENCOUNTER — Other Ambulatory Visit (HOSPITAL_COMMUNITY): Payer: Self-pay

## 2023-02-04 ENCOUNTER — Ambulatory Visit (INDEPENDENT_AMBULATORY_CARE_PROVIDER_SITE_OTHER): Payer: 59 | Admitting: Sports Medicine

## 2023-02-04 VITALS — BP 108/72 | HR 89 | Ht 62.0 in | Wt 189.0 lb

## 2023-02-04 DIAGNOSIS — E669 Obesity, unspecified: Secondary | ICD-10-CM

## 2023-02-04 DIAGNOSIS — Z0000 Encounter for general adult medical examination without abnormal findings: Secondary | ICD-10-CM

## 2023-02-04 DIAGNOSIS — Z23 Encounter for immunization: Secondary | ICD-10-CM | POA: Diagnosis not present

## 2023-02-04 MED ORDER — ZEPBOUND 15 MG/0.5ML ~~LOC~~ SOAJ
15.0000 mg | SUBCUTANEOUS | 11 refills | Status: AC
Start: 2023-02-04 — End: ?
  Filled 2023-02-04 – 2023-02-28 (×4): qty 2, 28d supply, fill #0
  Filled 2023-03-22: qty 2, 28d supply, fill #1
  Filled 2023-04-21: qty 2, 28d supply, fill #2
  Filled 2023-05-11 – 2023-05-16 (×2): qty 2, 28d supply, fill #3
  Filled 2023-06-10: qty 2, 28d supply, fill #4
  Filled 2023-07-02: qty 2, 28d supply, fill #5
  Filled 2023-07-22 – 2023-07-25 (×2): qty 2, 28d supply, fill #6
  Filled 2023-08-18: qty 2, 28d supply, fill #7

## 2023-02-04 NOTE — Progress Notes (Signed)
    Procedures performed today:    None.  Independent interpretation of notes and tests performed by another provider:   None.  Brief History, Exam, Impression, and Recommendations:    Obesity (BMI 30-39.9) Did not tolerate Saxenda, insufficient efficacy with Wegovy, switching to Zepbound. For insurance purposes she will be enrolled in a multidisciplinary weight loss program with calorie counting and an exercise prescription, starting at the maximum dose.  Annual physical exam Due for flu shot and cervical cancer screening, she tells me she will go ahead and make the appointment with her gynecologist.  Chronic process not at goal with pharmacologic intervention  ____________________________________________ Ihor Austin. Benjamin Stain, M.D., ABFM., CAQSM., AME. Primary Care and Sports Medicine [place] [facility]  Adjunct Professor of Family Medicine  [place] [employer] [facility]  Restaurant manager, fast food

## 2023-02-04 NOTE — Assessment & Plan Note (Signed)
Due for flu shot and cervical cancer screening, she tells me she will go ahead and make the appointment with her gynecologist.

## 2023-02-04 NOTE — Addendum Note (Signed)
Addended by: Carren Rang A on: 02/04/2023 03:38 PM   Modules accepted: Orders

## 2023-02-04 NOTE — Assessment & Plan Note (Signed)
Did not tolerate Saxenda, insufficient efficacy with Wegovy, switching to Zepbound. For insurance purposes she will be enrolled in a multidisciplinary weight loss program with calorie counting and an exercise prescription, starting at the maximum dose.

## 2023-02-05 ENCOUNTER — Other Ambulatory Visit (HOSPITAL_COMMUNITY): Payer: Self-pay

## 2023-02-14 ENCOUNTER — Other Ambulatory Visit (HOSPITAL_COMMUNITY): Payer: Self-pay

## 2023-02-14 ENCOUNTER — Encounter: Payer: Self-pay | Admitting: Sports Medicine

## 2023-02-17 ENCOUNTER — Telehealth: Payer: Self-pay

## 2023-02-17 ENCOUNTER — Other Ambulatory Visit (HOSPITAL_COMMUNITY): Payer: Self-pay

## 2023-02-17 NOTE — Telephone Encounter (Signed)
Initiated Prior authorization ZOX:WRUEAVWU 15MG /0.5ML pen-injectors Via: Covermymeds Case/Key:[id number] Status: Pending as of 02/17/23 Reason: Notified Pt via: Mychart

## 2023-02-21 ENCOUNTER — Other Ambulatory Visit: Payer: Self-pay

## 2023-02-21 ENCOUNTER — Other Ambulatory Visit: Payer: Self-pay | Admitting: Sports Medicine

## 2023-02-21 DIAGNOSIS — Z1231 Encounter for screening mammogram for malignant neoplasm of breast: Secondary | ICD-10-CM

## 2023-02-25 ENCOUNTER — Encounter: Payer: Self-pay | Admitting: Sports Medicine

## 2023-02-28 ENCOUNTER — Other Ambulatory Visit (HOSPITAL_COMMUNITY): Payer: Self-pay

## 2023-02-28 ENCOUNTER — Other Ambulatory Visit: Payer: Self-pay

## 2023-02-28 ENCOUNTER — Other Ambulatory Visit: Payer: Self-pay | Admitting: Sports Medicine

## 2023-02-28 DIAGNOSIS — Z981 Arthrodesis status: Secondary | ICD-10-CM

## 2023-02-28 DIAGNOSIS — M51369 Other intervertebral disc degeneration, lumbar region without mention of lumbar back pain or lower extremity pain: Secondary | ICD-10-CM

## 2023-03-01 ENCOUNTER — Encounter: Payer: Self-pay | Admitting: Pharmacist

## 2023-03-01 ENCOUNTER — Other Ambulatory Visit (HOSPITAL_COMMUNITY): Payer: Self-pay

## 2023-03-01 ENCOUNTER — Other Ambulatory Visit: Payer: Self-pay

## 2023-03-01 MED ORDER — PREGABALIN 200 MG PO CAPS: ORAL_CAPSULE | ORAL | 0 refills | Status: DC

## 2023-03-04 ENCOUNTER — Ambulatory Visit: Payer: 59 | Admitting: Sports Medicine

## 2023-03-07 ENCOUNTER — Encounter: Payer: 59 | Admitting: Obstetrics and Gynecology

## 2023-03-07 ENCOUNTER — Telehealth: Payer: Self-pay | Admitting: *Deleted

## 2023-03-07 NOTE — Telephone Encounter (Signed)
 Left patient a message that her appointment has been canceled per request with after hours line at 12:32 PM. Office was closed for lunch.

## 2023-03-22 ENCOUNTER — Other Ambulatory Visit: Payer: Self-pay

## 2023-03-22 ENCOUNTER — Other Ambulatory Visit: Payer: Self-pay | Admitting: Sports Medicine

## 2023-03-22 DIAGNOSIS — M5416 Radiculopathy, lumbar region: Secondary | ICD-10-CM

## 2023-03-22 MED ORDER — TRAMADOL HCL 50 MG PO TABS
50.0000 mg | ORAL_TABLET | Freq: Three times a day (TID) | ORAL | 0 refills | Status: DC | PRN
Start: 2023-03-22 — End: 2023-06-22

## 2023-04-13 ENCOUNTER — Ambulatory Visit: Payer: 59

## 2023-04-21 ENCOUNTER — Ambulatory Visit: Payer: 59

## 2023-04-21 ENCOUNTER — Other Ambulatory Visit: Payer: Self-pay

## 2023-04-26 ENCOUNTER — Other Ambulatory Visit: Payer: Self-pay | Admitting: Sports Medicine

## 2023-05-11 ENCOUNTER — Other Ambulatory Visit (HOSPITAL_COMMUNITY): Payer: Self-pay

## 2023-05-17 ENCOUNTER — Other Ambulatory Visit (HOSPITAL_COMMUNITY): Payer: Self-pay

## 2023-05-29 ENCOUNTER — Encounter: Payer: Self-pay | Admitting: Sports Medicine

## 2023-05-29 DIAGNOSIS — G894 Chronic pain syndrome: Secondary | ICD-10-CM

## 2023-05-30 ENCOUNTER — Other Ambulatory Visit: Payer: Self-pay | Admitting: Sports Medicine

## 2023-05-30 NOTE — Telephone Encounter (Signed)
 Looked at pt's med list and see that pt has both lyrica as well as gabapentin showing on med list.  Dr. Karie Schwalbe, please advise on this for pt.

## 2023-06-01 ENCOUNTER — Encounter: Payer: Self-pay | Admitting: Sports Medicine

## 2023-06-01 ENCOUNTER — Other Ambulatory Visit: Payer: Self-pay | Admitting: Sports Medicine

## 2023-06-01 DIAGNOSIS — G894 Chronic pain syndrome: Secondary | ICD-10-CM

## 2023-06-01 MED ORDER — GABAPENTIN 600 MG PO TABS: 600.0000 mg | ORAL_TABLET | Freq: Three times a day (TID) | ORAL | 0 refills | Status: DC

## 2023-06-01 MED ORDER — AMITRIPTYLINE HCL 50 MG PO TABS: 50.0000 mg | ORAL_TABLET | Freq: Every day | ORAL | 3 refills | Status: DC

## 2023-06-01 NOTE — Addendum Note (Signed)
 Addended by: Monica Becton on: 06/01/2023 04:11 PM   Modules accepted: Orders

## 2023-06-01 NOTE — Telephone Encounter (Signed)
 Message sent to patient via Mychart that prescription refill of gabapentin has been refilled .

## 2023-06-01 NOTE — Telephone Encounter (Signed)
 Requesting rx rf of gabapenin 600mg   Last written 04/28/2023 Last OV 02/04/2023 Upcoming appt = none

## 2023-06-10 ENCOUNTER — Other Ambulatory Visit (HOSPITAL_COMMUNITY): Payer: Self-pay

## 2023-06-18 ENCOUNTER — Other Ambulatory Visit: Payer: Self-pay | Admitting: Sports Medicine

## 2023-06-18 DIAGNOSIS — M51369 Other intervertebral disc degeneration, lumbar region without mention of lumbar back pain or lower extremity pain: Secondary | ICD-10-CM

## 2023-06-18 DIAGNOSIS — Z981 Arthrodesis status: Secondary | ICD-10-CM

## 2023-06-22 ENCOUNTER — Encounter: Payer: Self-pay | Admitting: Sports Medicine

## 2023-06-22 ENCOUNTER — Other Ambulatory Visit (INDEPENDENT_AMBULATORY_CARE_PROVIDER_SITE_OTHER): Payer: Self-pay | Admitting: Sports Medicine

## 2023-06-22 DIAGNOSIS — M5416 Radiculopathy, lumbar region: Secondary | ICD-10-CM

## 2023-06-22 NOTE — Telephone Encounter (Signed)
 Requesting rx rf of tramadol  50mg   Patient comment:  An you call this in asap. I leave tomorrow out of [place] 2 weeks. Thanks  Last written 03/22/2023 Last OV 02/04/2023 Upcoming appt = none

## 2023-06-23 MED ORDER — TRAMADOL HCL 50 MG PO TABS
50.0000 mg | ORAL_TABLET | Freq: Three times a day (TID) | ORAL | 0 refills | Status: DC | PRN
Start: 2023-06-23 — End: 2023-10-03

## 2023-06-23 NOTE — Telephone Encounter (Signed)

## 2023-06-28 ENCOUNTER — Ambulatory Visit: Payer: Self-pay

## 2023-06-28 NOTE — Telephone Encounter (Signed)
 Copied from CRM [id number]. Topic: Clinical - Red Word Triage >> Jun 28, 2023 10:27 AM Arlie Benedict B wrote: [facility] that prompted transfer to Nurse Triage: UTI symptoms frequency and burning while urinating. Patient states they are in [place]  and needs some form of antibiotics  Chief Complaint: urination pain Symptoms: painful urination, low abd pain, urinary frequency & urgency with low urine stream Frequency: x 3 days Pertinent Negatives: Patient denies fever, blood urine Disposition: [] ED /[] Urgent Care (no appt availability in office) / [] Appointment(In office/virtual)/ []  [facility]/ [] Home Care/ [] Refused Recommended Disposition /[] [facility]/ [x]  Follow-up with PCP Additional Notes: pt stated at present time she is doing some traveling and is [place] : would like to know if PCP could call ABX in to pharmacy in [place]  to help with s/s, please call patient back [phone] .  Will route request over to PCP  Reason for Disposition  All other patients with painful urination  (Exception: [1] EITHER frequency or urgency AND [2] has on-call doctor.)  Answer Assessment - Initial Assessment Questions 1. SEVERITY: "How bad is the pain?"  (e.g., Scale 1-10; mild, moderate, or severe)   - MILD (1-3): complains slightly about urination hurting   - MODERATE (4-7): interferes with normal activities     - SEVERE (8-10): excruciating, unwilling or unable to urinate because of the pain      Mild  2. FREQUENCY: "How many times have you had painful urination today?"  every time 3. PATTERN: "Is pain present every time you urinate or just sometimes?"      yes 4. ONSET: "When did the painful urination start?"      X 3 days  5. FEVER: "Do you have a fever?" If Yes, ask: "What is your temperature, how was it measured, and when did it start?"     no 6. PAST UTI: "Have you had a urine infection before?" If Yes, ask: "When was the last time?" and "What happened that time?"      no 7. CAUSE: "What  do you think is causing the painful urination?"  (e.g., UTI, scratch, Herpes sore)     UTI 8. OTHER SYMPTOMS: "Do you have any other symptoms?" (e.g., blood in urine, flank pain, genital sores, urgency, vaginal discharge)     Lower abd pain, urgency, frequency, low stream 9. PREGNANCY: "Is there any chance you are pregnant?" "When was your last menstrual period?"     N/a  Protocols used: Urination Pain - Female-A-AH

## 2023-06-28 NOTE — Telephone Encounter (Signed)
 Attempted to contact the  patient. No answer, left a detailed vm msg regarding the provider's recommendation. Direct call back info was provided.

## 2023-06-28 NOTE — Telephone Encounter (Signed)
 I cannot send antibiotics to [place]  with no other information or examination or testing.  They will need to find an urgent care there.

## 2023-06-30 NOTE — Telephone Encounter (Signed)
 Attempted to follow-up with patient after first voice message left by our office. Unfortunately, I was sent to voicemail as well. I have left a second message with our office phone number for a call back.

## 2023-07-03 ENCOUNTER — Other Ambulatory Visit (HOSPITAL_COMMUNITY): Payer: Self-pay

## 2023-07-05 ENCOUNTER — Ambulatory Visit: Admitting: Sports Medicine

## 2023-07-23 ENCOUNTER — Other Ambulatory Visit (HOSPITAL_COMMUNITY): Payer: Self-pay

## 2023-07-28 ENCOUNTER — Other Ambulatory Visit: Payer: Self-pay | Admitting: Sports Medicine

## 2023-08-19 ENCOUNTER — Other Ambulatory Visit (HOSPITAL_COMMUNITY): Payer: Self-pay

## 2023-09-23 ENCOUNTER — Other Ambulatory Visit: Payer: Self-pay | Admitting: Sports Medicine

## 2023-09-28 ENCOUNTER — Ambulatory Visit: Admitting: Sports Medicine

## 2023-10-02 ENCOUNTER — Other Ambulatory Visit: Payer: Self-pay | Admitting: Sports Medicine

## 2023-10-02 DIAGNOSIS — M5416 Radiculopathy, lumbar region: Secondary | ICD-10-CM

## 2023-10-02 DIAGNOSIS — M51369 Other intervertebral disc degeneration, lumbar region without mention of lumbar back pain or lower extremity pain: Secondary | ICD-10-CM

## 2023-10-17 ENCOUNTER — Ambulatory Visit: Admitting: Sports Medicine

## 2023-10-25 ENCOUNTER — Encounter: Payer: Self-pay | Admitting: Sports Medicine

## 2024-01-05 ENCOUNTER — Other Ambulatory Visit: Payer: Self-pay | Admitting: Urgent Care

## 2024-01-05 DIAGNOSIS — M5416 Radiculopathy, lumbar region: Secondary | ICD-10-CM

## 2024-01-05 DIAGNOSIS — M51369 Other intervertebral disc degeneration, lumbar region without mention of lumbar back pain or lower extremity pain: Secondary | ICD-10-CM

## 2024-01-05 NOTE — Telephone Encounter (Unsigned)
 Copied from CRM #[id number]. Topic: Clinical - Medication Refill >> Jan 05, 2024  2:49 PM Lauren C wrote: Medication:  pregabalin  (LYRICA ) 200 MG capsule traMADol  (ULTRAM ) 50 MG tablet methocarbamol  (ROBAXIN ) 500 MG tablet  Has the patient contacted their pharmacy? No They were prescribed by Dr. ONEIDA. [facility] appt set for next available with [facility] in [place].   This is the patient's preferred pharmacy:  [phone] [place], [place] [facility] [address] [place] [place]  [place] [address] Phone: [phone] Fax: [phone]  Is this the correct pharmacy for this prescription? Yes If no, delete pharmacy and type the correct one.   Has the prescription been filled recently? Yes  Is the patient out of the medication? No  Has the patient been seen for an appointment in the last year OR does the patient have an upcoming appointment? Yes  Can we respond through MyChart? Yes  Agent: Please be advised that Rx refills may take up to 3 business days. We ask that you follow-up with your pharmacy.

## 2024-01-11 ENCOUNTER — Ambulatory Visit: Payer: Self-pay | Admitting: Urgent Care

## 2024-01-11 VITALS — BP 110/72 | HR 100 | Ht 62.0 in | Wt 181.0 lb

## 2024-01-11 DIAGNOSIS — M51362 Other intervertebral disc degeneration, lumbar region with discogenic back pain and lower extremity pain: Secondary | ICD-10-CM | POA: Diagnosis not present

## 2024-01-11 DIAGNOSIS — Z903 Acquired absence of stomach [part of]: Secondary | ICD-10-CM

## 2024-01-11 DIAGNOSIS — E669 Obesity, unspecified: Secondary | ICD-10-CM | POA: Diagnosis not present

## 2024-01-11 DIAGNOSIS — Z1231 Encounter for screening mammogram for malignant neoplasm of breast: Secondary | ICD-10-CM

## 2024-01-11 DIAGNOSIS — Z981 Arthrodesis status: Secondary | ICD-10-CM

## 2024-01-11 DIAGNOSIS — M5416 Radiculopathy, lumbar region: Secondary | ICD-10-CM | POA: Diagnosis not present

## 2024-01-11 DIAGNOSIS — Z23 Encounter for immunization: Secondary | ICD-10-CM

## 2024-01-11 DIAGNOSIS — G4733 Obstructive sleep apnea (adult) (pediatric): Secondary | ICD-10-CM

## 2024-01-11 DIAGNOSIS — Z800 Family history of malignant neoplasm of digestive organs: Secondary | ICD-10-CM

## 2024-01-11 MED ORDER — PREGABALIN 200 MG PO CAPS: ORAL_CAPSULE | ORAL | 2 refills | Status: AC

## 2024-01-11 MED ORDER — PHENTERMINE HCL 37.5 MG PO TABS: ORAL_TABLET | ORAL | 3 refills | Status: AC

## 2024-01-11 MED ORDER — TRAMADOL HCL 50 MG PO TABS
50.0000 mg | ORAL_TABLET | Freq: Three times a day (TID) | ORAL | 0 refills | Status: AC
Start: 2024-01-11 — End: ?

## 2024-01-11 NOTE — Progress Notes (Signed)
 Established Patient Office Visit  Subjective:  Patient ID: Sara Wolfe, female    DOB: [birth year]  Age: 63 y.o. MRN: [id number]  Chief Complaint  Patient presents with   TRANSFER OF CARE    Wants a new sleep study and med refills    Pleasant 63yo female with hx of spine issues presents today to transition care to a new provider.  She is in need of refills. She has been taking Tramadol  and lyrica  three times daily for quite some time. This is effectively managing her pain. She denies ADRs to the medication.  She does struggle with weight as well and notes recent weight gain. She would like a refill on phentermine . She has taken in the past without any ADRs or cardiac issues.  She is due for repeat mammogram, order placed today.  She is also due for repeat colonoscopy. There is a family hx of colon cancer.  Pt has hx of gastric bypass. Is due for repeat labs sometime soon. Last folate noted to be low.  Lastly, pt has OSA. Feels that her current supplies are not adequately treating her sx. Feels that the seal of the mask is leaking. Her supplies are at least 65 years old.    Patient Active Problem List   Diagnosis Date Noted   Systolic murmur 07/02/2022   History of normal nuclear stress test in 2020 07/02/2022   Mood disorder 07/18/2019   Primary osteoarthritis of both first carpometacarpal joints 07/18/2019   Osteoporosis 01/31/2019   H/O abdominoplasty 11/08/2018   Obesity (BMI 30-39.9) 07/04/2018   Benign thyroid  nodule 07/03/2018   History of laparoscopic cholecystectomy 06/27/2018   Gastritis noted on EGD 06/27/2018   History of facelift 06/13/2017   Vitamin B 12 deficiency 12/17/2016   Hiatal hernia 03/04/2016   Chronic pain syndrome 10/16/2015   Hyperlipidemia 05/20/2015   Migraine headache 05/16/2015   Obstructive sleep apnea 05/16/2015   Lumbar degenerative disc disease 04/11/2014   Annual physical exam 02/21/2014   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    S/P cervical spinal fusion 10/04/2013   Hot flash, menopausal 12/27/2011   Past Medical History:  Diagnosis Date   Gallstones    GERD (gastroesophageal reflux disease)    history of prior to gastric bypass   Hot flashes, menopausal    Medical history non-contributory    Migraine    OSA on CPAP    PONV (postoperative nausea and vomiting)    Rectocele 02/13/2018   Ventral hernia 2018   Small, noted on CT Abd/pel   Vitamin B 12 deficiency    Past Surgical History:  Procedure Laterality Date   ANTERIOR CERVICAL DECOMP/DISCECTOMY FUSION N/A 10/04/2013   Procedure: ANTERIOR CERVICAL DECOMPRESSION/DISCECTOMY FUSION 3 LEVELS;  Surgeon: Oneil Rodgers Priestly, MD;  Location: [facility] OR;  Service: Orthopedics;  Laterality: N/A;  Anterior cervical decompression fusion cervical 4-5, cervical 5-6, cervical 6-7 with instrumentation and allograft   ANTERIOR CERVICAL DECOMP/DISCECTOMY FUSION N/A 04/11/2014   Procedure: ANTERIOR CERVICAL DECOMPRESSION/DISCECTOMY FUSION 1 LEVEL;  Surgeon: Oneil Rodgers Priestly, MD;  Location: [facility] OR;  Service: Orthopedics;  Laterality: N/A;  Anterior cervical decompression fusion, cervical 7-thoracic 1 with instrumentation and allograft   CHOLECYSTECTOMY N/A 03/06/2018   Procedure: LAPAROSCOPIC CHOLECYSTECTOMY;  Surgeon: Tanda Locus, MD;  Location: [facility] ORS;  Service: General;  Laterality: N/A;   COLONOSCOPY  2017   FACIAL COSMETIC SURGERY     FOOT SURGERY Bilateral    GASTRIC BYPASS     NECK EXPLORATION  2014,  2015   REDUCTION MAMMAPLASTY Bilateral 09/2019   TUBAL LIGATION     UPPER GI ENDOSCOPY  2017   Social History   Tobacco Use   Smoking status: Light Smoker    Current packs/day: 0.25    Average packs/day: 0.3 packs/day for 2.0 years (0.5 ttl pk-yrs)    Types: Cigarettes   Smokeless tobacco: Former    Quit date: 08/03/2018  Vaping Use   Vaping status: Never Used  Substance Use Topics   Alcohol use: No   Drug use: No      ROS: as noted in  HPI  Objective:     BP 110/72   Pulse 100   Ht 5' 2 (1.575 m)   Wt 181 lb (82.1 kg)   SpO2 96%   BMI 33.11 kg/m  BP Readings from Last 3 Encounters:  01/11/24 110/72  02/04/23 108/72  07/02/22 117/75   Wt Readings from Last 3 Encounters:  01/11/24 181 lb (82.1 kg)  02/04/23 189 lb (85.7 kg)  07/02/22 174 lb (78.9 kg)      Physical Exam Vitals and nursing note reviewed. Exam conducted with a chaperone present.  Constitutional:      General: She is not in acute distress.    Appearance: Normal appearance. She is not ill-appearing, toxic-appearing or diaphoretic.  HENT:     Head: Normocephalic and atraumatic.     Mouth/Throat:     Mouth: Mucous membranes are moist.     Pharynx: No oropharyngeal exudate or posterior oropharyngeal erythema.  Eyes:     General: No scleral icterus.       Right eye: No discharge.        Left eye: No discharge.     Extraocular Movements: Extraocular movements intact.     Pupils: Pupils are equal, round, and reactive to light.  Cardiovascular:     Rate and Rhythm: Normal rate and regular rhythm.     Heart sounds: Murmur heard.  Pulmonary:     Effort: Pulmonary effort is normal. No respiratory distress.     Breath sounds: Normal breath sounds. No stridor.  Musculoskeletal:     Cervical back: Normal range of motion. No rigidity.  Lymphadenopathy:     Cervical: No cervical adenopathy.  Skin:    General: Skin is warm and dry.     Coloration: Skin is not jaundiced.     Findings: No bruising, erythema or rash.  Neurological:     General: No focal deficit present.     Mental Status: She is alert and oriented to person, place, and time.     Gait: Gait normal.  Psychiatric:        Mood and Affect: Mood normal.        Behavior: Behavior normal.      No results found for any visits on 01/11/24.  Last CBC Lab Results  Component Value Date   WBC 8.1 07/02/2022   HGB 14.8 07/02/2022   HCT 45.5 (H) 07/02/2022   MCV 93.6 07/02/2022    MCH 30.5 07/02/2022   RDW 12.3 07/02/2022   PLT 257 07/02/2022   Last metabolic panel Lab Results  Component Value Date   GLUCOSE 73 07/02/2022   NA 143 07/02/2022   K 4.5 07/02/2022   CL 110 07/02/2022   CO2 23 07/02/2022   BUN 13 07/02/2022   CREATININE 0.70 07/02/2022   GFRNONAA >60 06/03/2022   CALCIUM 8.6 07/02/2022   PROT 6.2 07/02/2022   ALBUMIN 3.1 (L) 06/03/2022  BILITOT 0.3 07/02/2022   ALKPHOS 59 06/03/2022   AST 17 07/02/2022   ALT 21 07/02/2022   ANIONGAP 11 06/03/2022   Last lipids Lab Results  Component Value Date   CHOL 203 (H) 02/20/2021   HDL 82 02/20/2021   LDLCALC 106 (H) 02/20/2021   TRIG 68 02/20/2021   CHOLHDL 2.5 02/20/2021   Last hemoglobin A1c Lab Results  Component Value Date   HGBA1C 4.9 02/20/2021   Last thyroid  functions Lab Results  Component Value Date   TSH 2.09 02/20/2021   Last vitamin D  Lab Results  Component Value Date   VD25OH 30 09/12/2018   Last vitamin B12 and Folate Lab Results  Component Value Date   VITAMINB12 1,022 09/10/2019   FOLATE 3.4 (L) 12/15/2016      The 10-year ASCVD risk score (Arnett DK, et al., 2019) is: 5.7%  Assessment & Plan:  Degeneration of intervertebral disc of lumbar region with discogenic back pain and lower extremity pain -     traMADol  HCl; Take 1 tablet (50 mg total) by mouth 3 (three) times daily.  Dispense: 270 tablet; Refill: 0 -     Pregabalin ; TAKE 1 CAPSULE BY MOUTH 2-3 TIMES A DAY  Dispense: 270 capsule; Refill: 2  Left lumbar radiculitis -     traMADol  HCl; Take 1 tablet (50 mg total) by mouth 3 (three) times daily.  Dispense: 270 tablet; Refill: 0 -     Pregabalin ; TAKE 1 CAPSULE BY MOUTH 2-3 TIMES A DAY  Dispense: 270 capsule; Refill: 2  S/P cervical spinal fusion -     traMADol  HCl; Take 1 tablet (50 mg total) by mouth 3 (three) times daily.  Dispense: 270 tablet; Refill: 0 -     Pregabalin ; TAKE 1 CAPSULE BY MOUTH 2-3 TIMES A DAY  Dispense: 270 capsule; Refill:  2  Obesity (BMI 30-39.9) -     Phentermine  HCl; One tab by mouth qAM  Dispense: 30 tablet; Refill: 3  Screening mammogram for breast cancer -     Digital Screening Mammogram, Left and Right; Future  Family history of colon cancer -     Ambulatory referral to Gastroenterology  Immunization due -     Flu vaccine trivalent PF, 6mos and older(Flulaval,Afluria,Fluarix,Fluzone) -     Pneumococcal conjugate vaccine 20-valent  Obstructive sleep apnea -     For home use only DME continuous positive airway pressure (CPAP)  History of sleeve gastrectomy   Med refills and screening mammo/colon ordered today. Vaccines updated. Pt will need to schedule annual PE with fasting labs in Dec. Will restart phentermine  and monitor response to therapy.  Return in about 26 days (around 02/06/2024).   Benton LITTIE Gave, PA

## 2024-01-12 ENCOUNTER — Encounter: Payer: Self-pay | Admitting: Urgent Care

## 2024-01-13 NOTE — Patient Instructions (Signed)
 Please schedule mammogram. Referral placed to complete colonoscopy.  Continue with Tramadol  and lyrica  as previously ordered. I have added phentermine  to help with weight. Monitor your heart rate and blood pressure on this medication.  Please schedule your annual physical with fasting labs and pap smear next month.

## 2024-01-16 ENCOUNTER — Other Ambulatory Visit: Payer: Self-pay

## 2024-01-16 DIAGNOSIS — M51369 Other intervertebral disc degeneration, lumbar region without mention of lumbar back pain or lower extremity pain: Secondary | ICD-10-CM

## 2024-01-16 MED ORDER — METHOCARBAMOL 500 MG PO TABS: 500.0000 mg | ORAL_TABLET | Freq: Three times a day (TID) | ORAL | 2 refills | Status: AC

## 2024-02-10 ENCOUNTER — Encounter: Admitting: Urgent Care

## 2024-02-10 ENCOUNTER — Other Ambulatory Visit: Payer: Self-pay | Admitting: Urgent Care

## 2024-02-10 DIAGNOSIS — Z981 Arthrodesis status: Secondary | ICD-10-CM

## 2024-02-10 DIAGNOSIS — G894 Chronic pain syndrome: Secondary | ICD-10-CM

## 2024-02-10 NOTE — Telephone Encounter (Signed)
 Copied from CRM #[id number]. Topic: Clinical - Medication Refill >> Feb 10, 2024  2:55 PM Kevelyn M wrote: Medication: amitriptyline  (ELAVIL ) 50 MG tablet, DULoxetine  (CYMBALTA ) 60 MG capsule. Patient was trying to get in sooner but January 13th was the earliest available appointment.  Has the patient contacted their pharmacy? Yes (Agent: If no, request that the patient contact the pharmacy for the refill. If patient does not wish to contact the pharmacy document the reason why and proceed with request.) (Agent: If yes, when and what did the pharmacy advise?)  This is the patient's preferred pharmacy:  [facility] [place], [place] - [address] [place] [address] [place] [address] Phone: [phone] Fax: [phone]  Is this the correct pharmacy for this prescription? Yes If no, delete pharmacy and type the correct one.   Has the prescription been filled recently? No  Is the patient out of the medication? No  Has the patient been seen for an appointment in the last year OR does the patient have an upcoming appointment? Yes  Can we respond through MyChart? Yes  Agent: Please be advised that Rx refills may take up to 3 business days. We ask that you follow-up with your pharmacy.

## 2024-02-15 MED ORDER — AMITRIPTYLINE HCL 50 MG PO TABS: 50.0000 mg | ORAL_TABLET | Freq: Every day | ORAL | 3 refills | Status: AC

## 2024-02-15 MED ORDER — DULOXETINE HCL 60 MG PO CPEP: 60.0000 mg | ORAL_CAPSULE | Freq: Every day | ORAL | 1 refills | Status: AC

## 2024-03-01 ENCOUNTER — Encounter: Admitting: Urgent Care

## 2024-03-01 ENCOUNTER — Telehealth: Payer: Self-pay

## 2024-03-01 NOTE — Telephone Encounter (Signed)
 A refill was sent in on 02/15/24 at [facility]. Left message for pt to call back and verify which pharmacy she is currently using.

## 2024-03-01 NOTE — Telephone Encounter (Signed)
 Patient is returning call to confirm that she uses [place] [facility]

## 2024-03-02 NOTE — Telephone Encounter (Signed)
 Will [facility]

## 2024-03-06 ENCOUNTER — Encounter: Admitting: Urgent Care

## 2024-03-14 ENCOUNTER — Encounter: Admitting: Urgent Care

## 2024-03-20 ENCOUNTER — Encounter: Admitting: Urgent Care

## 2024-03-27 ENCOUNTER — Encounter: Admitting: Urgent Care
